# Patient Record
Sex: Male | Born: 1970 | Race: White | Hispanic: No | Marital: Single | Smoking: Current every day smoker
Health system: Southern US, Community
[De-identification: ages and names within clinical notes are randomized; demographics above are authoritative.]

## PROBLEM LIST (undated history)

## (undated) DIAGNOSIS — F329 Major depressive disorder, single episode, unspecified: Secondary | ICD-10-CM

## (undated) DIAGNOSIS — F32A Depression, unspecified: Secondary | ICD-10-CM

## (undated) DIAGNOSIS — Z8489 Family history of other specified conditions: Secondary | ICD-10-CM

## (undated) DIAGNOSIS — F419 Anxiety disorder, unspecified: Secondary | ICD-10-CM

## (undated) DIAGNOSIS — I10 Essential (primary) hypertension: Secondary | ICD-10-CM

## (undated) DIAGNOSIS — Z21 Asymptomatic human immunodeficiency virus [HIV] infection status: Secondary | ICD-10-CM

## (undated) DIAGNOSIS — B20 Human immunodeficiency virus [HIV] disease: Secondary | ICD-10-CM

## (undated) HISTORY — DX: Depression, unspecified: F32.A

## (undated) HISTORY — DX: Anxiety disorder, unspecified: F41.9

## (undated) HISTORY — DX: Asymptomatic human immunodeficiency virus (hiv) infection status: Z21

## (undated) HISTORY — DX: Major depressive disorder, single episode, unspecified: F32.9

## (undated) HISTORY — DX: Human immunodeficiency virus (HIV) disease: B20

---

## 1996-07-04 HISTORY — PX: APPENDECTOMY: SHX54

## 2000-08-04 ENCOUNTER — Encounter: Payer: Self-pay | Admitting: Infectious Diseases

## 2000-08-28 ENCOUNTER — Encounter (INDEPENDENT_AMBULATORY_CARE_PROVIDER_SITE_OTHER): Payer: Self-pay | Admitting: *Deleted

## 2000-08-28 ENCOUNTER — Encounter: Admission: RE | Admit: 2000-08-28 | Discharge: 2000-08-28 | Payer: Self-pay | Admitting: Infectious Diseases

## 2000-08-28 LAB — CONVERTED CEMR LAB
CD4 Count: 437 microliters
CD4 T Cell Abs: 437

## 2000-09-13 ENCOUNTER — Encounter: Admission: RE | Admit: 2000-09-13 | Discharge: 2000-09-13 | Payer: Self-pay | Admitting: Infectious Diseases

## 2000-10-04 ENCOUNTER — Ambulatory Visit (HOSPITAL_COMMUNITY): Admission: RE | Admit: 2000-10-04 | Discharge: 2000-10-04 | Payer: Self-pay | Admitting: Infectious Diseases

## 2000-10-04 ENCOUNTER — Encounter: Admission: RE | Admit: 2000-10-04 | Discharge: 2000-10-04 | Payer: Self-pay | Admitting: Infectious Diseases

## 2000-10-25 ENCOUNTER — Encounter: Admission: RE | Admit: 2000-10-25 | Discharge: 2000-10-25 | Payer: Self-pay | Admitting: Infectious Diseases

## 2000-11-21 ENCOUNTER — Encounter: Admission: RE | Admit: 2000-11-21 | Discharge: 2000-11-21 | Payer: Self-pay | Admitting: Infectious Diseases

## 2000-12-06 ENCOUNTER — Encounter: Admission: RE | Admit: 2000-12-06 | Discharge: 2000-12-06 | Payer: Self-pay | Admitting: Infectious Diseases

## 2000-12-28 ENCOUNTER — Encounter: Admission: RE | Admit: 2000-12-28 | Discharge: 2000-12-28 | Payer: Self-pay | Admitting: Infectious Diseases

## 2001-01-17 ENCOUNTER — Encounter: Admission: RE | Admit: 2001-01-17 | Discharge: 2001-01-17 | Payer: Self-pay | Admitting: Infectious Diseases

## 2001-05-11 ENCOUNTER — Ambulatory Visit (HOSPITAL_COMMUNITY): Admission: RE | Admit: 2001-05-11 | Discharge: 2001-05-11 | Payer: Self-pay | Admitting: Infectious Diseases

## 2001-05-11 ENCOUNTER — Encounter: Admission: RE | Admit: 2001-05-11 | Discharge: 2001-05-11 | Payer: Self-pay | Admitting: Infectious Diseases

## 2001-05-17 ENCOUNTER — Ambulatory Visit (HOSPITAL_COMMUNITY): Admission: RE | Admit: 2001-05-17 | Discharge: 2001-05-17 | Payer: Self-pay | Admitting: Infectious Diseases

## 2001-05-17 ENCOUNTER — Encounter: Admission: RE | Admit: 2001-05-17 | Discharge: 2001-05-17 | Payer: Self-pay | Admitting: Infectious Diseases

## 2001-05-23 ENCOUNTER — Encounter: Admission: RE | Admit: 2001-05-23 | Discharge: 2001-05-23 | Payer: Self-pay | Admitting: Infectious Diseases

## 2001-08-21 ENCOUNTER — Encounter: Admission: RE | Admit: 2001-08-21 | Discharge: 2001-08-21 | Payer: Self-pay | Admitting: Infectious Diseases

## 2001-08-21 ENCOUNTER — Ambulatory Visit (HOSPITAL_COMMUNITY): Admission: RE | Admit: 2001-08-21 | Discharge: 2001-08-21 | Payer: Self-pay | Admitting: Infectious Diseases

## 2001-09-05 ENCOUNTER — Encounter: Admission: RE | Admit: 2001-09-05 | Discharge: 2001-09-05 | Payer: Self-pay | Admitting: Infectious Diseases

## 2002-01-21 ENCOUNTER — Encounter: Admission: RE | Admit: 2002-01-21 | Discharge: 2002-01-21 | Payer: Self-pay | Admitting: Infectious Diseases

## 2002-01-21 ENCOUNTER — Ambulatory Visit (HOSPITAL_COMMUNITY): Admission: RE | Admit: 2002-01-21 | Discharge: 2002-01-21 | Payer: Self-pay | Admitting: Infectious Diseases

## 2002-02-13 ENCOUNTER — Encounter: Admission: RE | Admit: 2002-02-13 | Discharge: 2002-02-13 | Payer: Self-pay | Admitting: Infectious Diseases

## 2002-05-27 ENCOUNTER — Encounter: Admission: RE | Admit: 2002-05-27 | Discharge: 2002-05-27 | Payer: Self-pay | Admitting: Internal Medicine

## 2002-05-27 ENCOUNTER — Ambulatory Visit (HOSPITAL_COMMUNITY): Admission: RE | Admit: 2002-05-27 | Discharge: 2002-05-27 | Payer: Self-pay | Admitting: Infectious Diseases

## 2002-06-19 ENCOUNTER — Encounter: Admission: RE | Admit: 2002-06-19 | Discharge: 2002-06-19 | Payer: Self-pay | Admitting: Infectious Diseases

## 2002-08-22 ENCOUNTER — Encounter: Admission: RE | Admit: 2002-08-22 | Discharge: 2002-08-22 | Payer: Self-pay | Admitting: Infectious Diseases

## 2002-09-18 ENCOUNTER — Encounter: Admission: RE | Admit: 2002-09-18 | Discharge: 2002-09-18 | Payer: Self-pay | Admitting: Infectious Diseases

## 2002-09-27 ENCOUNTER — Encounter: Admission: RE | Admit: 2002-09-27 | Discharge: 2002-09-27 | Payer: Self-pay | Admitting: Infectious Diseases

## 2002-10-04 ENCOUNTER — Encounter: Admission: RE | Admit: 2002-10-04 | Discharge: 2002-10-04 | Payer: Self-pay | Admitting: Infectious Diseases

## 2002-10-16 ENCOUNTER — Encounter: Admission: RE | Admit: 2002-10-16 | Discharge: 2002-10-16 | Payer: Self-pay | Admitting: Infectious Diseases

## 2002-11-01 ENCOUNTER — Encounter: Admission: RE | Admit: 2002-11-01 | Discharge: 2002-11-01 | Payer: Self-pay | Admitting: Infectious Diseases

## 2002-11-06 ENCOUNTER — Encounter: Admission: RE | Admit: 2002-11-06 | Discharge: 2002-11-06 | Payer: Self-pay | Admitting: Infectious Diseases

## 2002-11-29 ENCOUNTER — Encounter: Admission: RE | Admit: 2002-11-29 | Discharge: 2002-11-29 | Payer: Self-pay | Admitting: Infectious Diseases

## 2002-12-23 ENCOUNTER — Encounter: Admission: RE | Admit: 2002-12-23 | Discharge: 2002-12-23 | Payer: Self-pay | Admitting: Infectious Diseases

## 2002-12-25 ENCOUNTER — Encounter: Admission: RE | Admit: 2002-12-25 | Discharge: 2002-12-25 | Payer: Self-pay | Admitting: Infectious Diseases

## 2003-01-16 ENCOUNTER — Encounter: Admission: RE | Admit: 2003-01-16 | Discharge: 2003-01-16 | Payer: Self-pay | Admitting: Infectious Diseases

## 2003-03-13 ENCOUNTER — Ambulatory Visit (HOSPITAL_BASED_OUTPATIENT_CLINIC_OR_DEPARTMENT_OTHER): Admission: RE | Admit: 2003-03-13 | Discharge: 2003-03-13 | Payer: Self-pay | Admitting: Surgery

## 2003-03-13 ENCOUNTER — Encounter (INDEPENDENT_AMBULATORY_CARE_PROVIDER_SITE_OTHER): Payer: Self-pay | Admitting: *Deleted

## 2003-03-19 ENCOUNTER — Encounter: Admission: RE | Admit: 2003-03-19 | Discharge: 2003-03-19 | Payer: Self-pay | Admitting: Infectious Diseases

## 2003-04-28 ENCOUNTER — Encounter: Admission: RE | Admit: 2003-04-28 | Discharge: 2003-04-28 | Payer: Self-pay | Admitting: Infectious Diseases

## 2003-05-01 ENCOUNTER — Ambulatory Visit (HOSPITAL_BASED_OUTPATIENT_CLINIC_OR_DEPARTMENT_OTHER): Admission: RE | Admit: 2003-05-01 | Discharge: 2003-05-01 | Payer: Self-pay | Admitting: Surgery

## 2003-05-16 ENCOUNTER — Encounter: Admission: RE | Admit: 2003-05-16 | Discharge: 2003-05-16 | Payer: Self-pay | Admitting: Infectious Diseases

## 2003-06-12 ENCOUNTER — Encounter: Admission: RE | Admit: 2003-06-12 | Discharge: 2003-06-12 | Payer: Self-pay | Admitting: Infectious Diseases

## 2003-07-05 HISTORY — PX: HAND SURGERY: SHX662

## 2003-07-11 ENCOUNTER — Encounter: Admission: RE | Admit: 2003-07-11 | Discharge: 2003-07-11 | Payer: Self-pay | Admitting: Infectious Diseases

## 2003-08-11 ENCOUNTER — Encounter: Admission: RE | Admit: 2003-08-11 | Discharge: 2003-08-11 | Payer: Self-pay | Admitting: Infectious Diseases

## 2003-09-05 ENCOUNTER — Encounter: Admission: RE | Admit: 2003-09-05 | Discharge: 2003-09-05 | Payer: Self-pay | Admitting: Infectious Diseases

## 2003-09-11 ENCOUNTER — Ambulatory Visit (HOSPITAL_COMMUNITY): Admission: RE | Admit: 2003-09-11 | Discharge: 2003-09-11 | Payer: Self-pay | Admitting: Surgery

## 2003-09-11 ENCOUNTER — Encounter (INDEPENDENT_AMBULATORY_CARE_PROVIDER_SITE_OTHER): Payer: Self-pay | Admitting: *Deleted

## 2003-09-11 ENCOUNTER — Ambulatory Visit (HOSPITAL_BASED_OUTPATIENT_CLINIC_OR_DEPARTMENT_OTHER): Admission: RE | Admit: 2003-09-11 | Discharge: 2003-09-11 | Payer: Self-pay | Admitting: Surgery

## 2003-11-19 ENCOUNTER — Encounter: Admission: RE | Admit: 2003-11-19 | Discharge: 2003-11-19 | Payer: Self-pay | Admitting: Infectious Diseases

## 2004-02-18 ENCOUNTER — Encounter: Admission: RE | Admit: 2004-02-18 | Discharge: 2004-02-18 | Payer: Self-pay | Admitting: Infectious Diseases

## 2004-02-18 ENCOUNTER — Ambulatory Visit (HOSPITAL_COMMUNITY): Admission: RE | Admit: 2004-02-18 | Discharge: 2004-02-18 | Payer: Self-pay | Admitting: Infectious Diseases

## 2004-03-10 ENCOUNTER — Ambulatory Visit: Payer: Self-pay | Admitting: Infectious Diseases

## 2004-05-07 ENCOUNTER — Ambulatory Visit (HOSPITAL_COMMUNITY): Admission: RE | Admit: 2004-05-07 | Discharge: 2004-05-07 | Payer: Self-pay | Admitting: Infectious Diseases

## 2004-05-07 ENCOUNTER — Ambulatory Visit: Payer: Self-pay | Admitting: Infectious Diseases

## 2004-05-24 ENCOUNTER — Ambulatory Visit: Payer: Self-pay | Admitting: Infectious Diseases

## 2004-08-18 ENCOUNTER — Ambulatory Visit: Payer: Self-pay | Admitting: Infectious Diseases

## 2004-08-18 ENCOUNTER — Ambulatory Visit (HOSPITAL_COMMUNITY): Admission: RE | Admit: 2004-08-18 | Discharge: 2004-08-18 | Payer: Self-pay | Admitting: Infectious Diseases

## 2004-09-01 ENCOUNTER — Ambulatory Visit: Payer: Self-pay | Admitting: Infectious Diseases

## 2004-09-23 ENCOUNTER — Ambulatory Visit: Payer: Self-pay | Admitting: Internal Medicine

## 2004-09-23 ENCOUNTER — Inpatient Hospital Stay (HOSPITAL_COMMUNITY): Admission: EM | Admit: 2004-09-23 | Discharge: 2004-09-24 | Payer: Self-pay | Admitting: Emergency Medicine

## 2004-09-23 ENCOUNTER — Ambulatory Visit: Payer: Self-pay | Admitting: Infectious Diseases

## 2004-10-13 ENCOUNTER — Ambulatory Visit: Payer: Self-pay | Admitting: Infectious Diseases

## 2004-10-15 DIAGNOSIS — T1490XA Injury, unspecified, initial encounter: Secondary | ICD-10-CM

## 2004-11-12 ENCOUNTER — Ambulatory Visit: Payer: Self-pay | Admitting: Infectious Diseases

## 2004-11-12 ENCOUNTER — Ambulatory Visit (HOSPITAL_COMMUNITY): Admission: RE | Admit: 2004-11-12 | Discharge: 2004-11-12 | Payer: Self-pay | Admitting: Infectious Diseases

## 2005-01-17 ENCOUNTER — Ambulatory Visit: Payer: Self-pay | Admitting: Infectious Diseases

## 2005-01-27 ENCOUNTER — Ambulatory Visit: Payer: Self-pay | Admitting: Infectious Diseases

## 2005-03-09 ENCOUNTER — Ambulatory Visit (HOSPITAL_COMMUNITY): Admission: RE | Admit: 2005-03-09 | Discharge: 2005-03-09 | Payer: Self-pay | Admitting: Infectious Diseases

## 2005-03-09 ENCOUNTER — Ambulatory Visit: Payer: Self-pay | Admitting: Infectious Diseases

## 2005-03-23 ENCOUNTER — Ambulatory Visit: Payer: Self-pay | Admitting: Infectious Diseases

## 2005-07-27 ENCOUNTER — Ambulatory Visit: Payer: Self-pay | Admitting: Infectious Diseases

## 2005-08-17 ENCOUNTER — Ambulatory Visit: Payer: Self-pay | Admitting: Infectious Diseases

## 2005-12-15 ENCOUNTER — Encounter: Admission: RE | Admit: 2005-12-15 | Discharge: 2005-12-15 | Payer: Self-pay | Admitting: Infectious Diseases

## 2005-12-15 ENCOUNTER — Ambulatory Visit: Payer: Self-pay | Admitting: Infectious Diseases

## 2005-12-15 ENCOUNTER — Encounter (INDEPENDENT_AMBULATORY_CARE_PROVIDER_SITE_OTHER): Payer: Self-pay | Admitting: *Deleted

## 2005-12-15 LAB — CONVERTED CEMR LAB
CD4 Count: 780 microliters
HIV 1 RNA Quant: 399 copies/mL

## 2006-01-02 ENCOUNTER — Ambulatory Visit: Payer: Self-pay | Admitting: Infectious Diseases

## 2006-03-17 ENCOUNTER — Encounter (INDEPENDENT_AMBULATORY_CARE_PROVIDER_SITE_OTHER): Payer: Self-pay | Admitting: *Deleted

## 2006-03-17 ENCOUNTER — Ambulatory Visit: Payer: Self-pay | Admitting: Internal Medicine

## 2006-03-17 ENCOUNTER — Encounter: Admission: RE | Admit: 2006-03-17 | Discharge: 2006-03-17 | Payer: Self-pay | Admitting: Internal Medicine

## 2006-03-17 LAB — CONVERTED CEMR LAB
CD4 Count: 700 microliters
HIV 1 RNA Quant: 267 copies/mL

## 2006-03-21 ENCOUNTER — Ambulatory Visit: Payer: Self-pay | Admitting: Internal Medicine

## 2006-05-03 ENCOUNTER — Ambulatory Visit: Payer: Self-pay | Admitting: Infectious Diseases

## 2006-08-07 ENCOUNTER — Encounter (INDEPENDENT_AMBULATORY_CARE_PROVIDER_SITE_OTHER): Payer: Self-pay | Admitting: *Deleted

## 2006-08-07 ENCOUNTER — Ambulatory Visit: Payer: Self-pay | Admitting: Infectious Diseases

## 2006-08-07 ENCOUNTER — Encounter: Admission: RE | Admit: 2006-08-07 | Discharge: 2006-08-07 | Payer: Self-pay | Admitting: Infectious Diseases

## 2006-08-07 LAB — CONVERTED CEMR LAB
ALT: 31 units/L (ref 0–53)
AST: 22 units/L (ref 0–37)
Albumin: 4.3 g/dL (ref 3.5–5.2)
Alkaline Phosphatase: 92 units/L (ref 39–117)
BUN: 13 mg/dL (ref 6–23)
Basophils Absolute: 0 10*3/uL (ref 0.0–0.1)
Basophils Relative: 0 % (ref 0–1)
Bilirubin Urine: NEGATIVE
CD4 Count: 520 microliters
CO2: 26 meq/L (ref 19–32)
Calcium: 9.6 mg/dL (ref 8.4–10.5)
Chloride: 105 meq/L (ref 96–112)
Creatinine, Ser: 0.78 mg/dL (ref 0.40–1.50)
Eosinophils Absolute: 0 10*3/uL (ref 0.0–0.7)
Eosinophils Relative: 0 % (ref 0–5)
Glucose, Bld: 106 mg/dL — ABNORMAL HIGH (ref 70–99)
HCT: 50.3 % (ref 39.0–52.0)
HIV 1 RNA Quant: 607 copies/mL
HIV 1 RNA Quant: 607 copies/mL — ABNORMAL HIGH (ref <50)
HIV-1 RNA Quant, Log: 2.78 — ABNORMAL HIGH (ref <1.70)
Hemoglobin, Urine: NEGATIVE
Hemoglobin: 16.3 g/dL (ref 13.0–17.0)
Ketones, ur: NEGATIVE mg/dL
Leukocytes, UA: NEGATIVE
Lymphocytes Relative: 18 % (ref 12–46)
Lymphs Abs: 1.7 10*3/uL (ref 0.7–3.3)
MCHC: 32.4 g/dL (ref 30.0–36.0)
MCV: 96.5 fL (ref 78.0–100.0)
Monocytes Absolute: 0.7 10*3/uL (ref 0.2–0.7)
Monocytes Relative: 7 % (ref 3–11)
Neutro Abs: 7.3 10*3/uL (ref 1.7–7.7)
Neutrophils Relative %: 75 % (ref 43–77)
Nitrite: NEGATIVE
Platelets: 279 10*3/uL (ref 150–400)
Potassium: 4.3 meq/L (ref 3.5–5.3)
Protein, ur: NEGATIVE mg/dL
RBC: 5.21 M/uL (ref 4.22–5.81)
RDW: 14.3 % — ABNORMAL HIGH (ref 11.5–14.0)
Sodium: 140 meq/L (ref 135–145)
Specific Gravity, Urine: 1.015 (ref 1.005–1.03)
Total Bilirubin: 0.4 mg/dL (ref 0.3–1.2)
Total Protein: 6.8 g/dL (ref 6.0–8.3)
Urine Glucose: NEGATIVE mg/dL
Urobilinogen, UA: 0.2 (ref 0.0–1.0)
WBC: 9.8 10*3/uL (ref 4.0–10.5)
pH: 7 (ref 5.0–8.0)

## 2006-08-09 DIAGNOSIS — F329 Major depressive disorder, single episode, unspecified: Secondary | ICD-10-CM

## 2006-08-09 DIAGNOSIS — A630 Anogenital (venereal) warts: Secondary | ICD-10-CM

## 2006-08-09 DIAGNOSIS — F32A Depression, unspecified: Secondary | ICD-10-CM | POA: Insufficient documentation

## 2006-08-09 DIAGNOSIS — F411 Generalized anxiety disorder: Secondary | ICD-10-CM

## 2006-08-09 DIAGNOSIS — B20 Human immunodeficiency virus [HIV] disease: Secondary | ICD-10-CM

## 2006-08-22 ENCOUNTER — Emergency Department (HOSPITAL_COMMUNITY): Admission: EM | Admit: 2006-08-22 | Discharge: 2006-08-22 | Payer: Self-pay | Admitting: Emergency Medicine

## 2006-08-28 ENCOUNTER — Encounter: Payer: Self-pay | Admitting: Infectious Diseases

## 2006-08-28 ENCOUNTER — Encounter (INDEPENDENT_AMBULATORY_CARE_PROVIDER_SITE_OTHER): Payer: Self-pay | Admitting: *Deleted

## 2006-08-28 LAB — CONVERTED CEMR LAB

## 2006-09-06 ENCOUNTER — Telehealth (INDEPENDENT_AMBULATORY_CARE_PROVIDER_SITE_OTHER): Payer: Self-pay | Admitting: Infectious Diseases

## 2006-09-10 ENCOUNTER — Encounter (INDEPENDENT_AMBULATORY_CARE_PROVIDER_SITE_OTHER): Payer: Self-pay | Admitting: *Deleted

## 2006-09-13 ENCOUNTER — Telehealth: Payer: Self-pay | Admitting: Infectious Diseases

## 2006-09-21 ENCOUNTER — Telehealth: Payer: Self-pay | Admitting: Infectious Diseases

## 2006-10-11 ENCOUNTER — Ambulatory Visit: Payer: Self-pay | Admitting: Internal Medicine

## 2006-10-11 ENCOUNTER — Telehealth: Payer: Self-pay | Admitting: Infectious Diseases

## 2006-10-11 DIAGNOSIS — A630 Anogenital (venereal) warts: Secondary | ICD-10-CM | POA: Insufficient documentation

## 2006-10-11 DIAGNOSIS — R197 Diarrhea, unspecified: Secondary | ICD-10-CM

## 2006-10-16 ENCOUNTER — Ambulatory Visit: Payer: Self-pay | Admitting: Infectious Diseases

## 2006-10-16 DIAGNOSIS — L02818 Cutaneous abscess of other sites: Secondary | ICD-10-CM

## 2006-10-16 DIAGNOSIS — L03818 Cellulitis of other sites: Secondary | ICD-10-CM

## 2006-10-18 ENCOUNTER — Telehealth: Payer: Self-pay

## 2006-11-22 ENCOUNTER — Telehealth: Payer: Self-pay | Admitting: Infectious Diseases

## 2007-01-22 ENCOUNTER — Telehealth: Payer: Self-pay | Admitting: Infectious Diseases

## 2007-01-22 ENCOUNTER — Ambulatory Visit: Payer: Self-pay | Admitting: Infectious Diseases

## 2007-01-22 ENCOUNTER — Encounter: Admission: RE | Admit: 2007-01-22 | Discharge: 2007-01-22 | Payer: Self-pay | Admitting: Infectious Diseases

## 2007-01-22 LAB — CONVERTED CEMR LAB
ALT: 38 units/L (ref 0–53)
AST: 25 units/L (ref 0–37)
Albumin: 4 g/dL (ref 3.5–5.2)
Alkaline Phosphatase: 90 units/L (ref 39–117)
BUN: 12 mg/dL (ref 6–23)
Basophils Absolute: 0 10*3/uL (ref 0.0–0.1)
Basophils Relative: 0 % (ref 0–1)
CO2: 27 meq/L (ref 19–32)
Calcium: 9 mg/dL (ref 8.4–10.5)
Chloride: 106 meq/L (ref 96–112)
Creatinine, Ser: 0.75 mg/dL (ref 0.40–1.50)
Eosinophils Absolute: 0.2 10*3/uL (ref 0.0–0.7)
Eosinophils Relative: 2 % (ref 0–5)
Glucose, Bld: 111 mg/dL — ABNORMAL HIGH (ref 70–99)
HCT: 49.3 % (ref 39.0–52.0)
HIV 1 RNA Quant: 210 copies/mL — ABNORMAL HIGH (ref <50)
HIV-1 RNA Quant, Log: 2.32 — ABNORMAL HIGH (ref <1.70)
Hemoglobin: 16.3 g/dL (ref 13.0–17.0)
Lymphocytes Relative: 29 % (ref 12–46)
Lymphs Abs: 2.3 10*3/uL (ref 0.7–3.3)
MCHC: 33.1 g/dL (ref 30.0–36.0)
MCV: 98.6 fL (ref 78.0–100.0)
Monocytes Absolute: 0.7 10*3/uL (ref 0.2–0.7)
Monocytes Relative: 10 % (ref 3–11)
Neutro Abs: 4.6 10*3/uL (ref 1.7–7.7)
Neutrophils Relative %: 59 % (ref 43–77)
Platelets: 263 10*3/uL (ref 150–400)
Potassium: 4.6 meq/L (ref 3.5–5.3)
RBC: 5 M/uL (ref 4.22–5.81)
RDW: 13.9 % (ref 11.5–14.0)
Sodium: 141 meq/L (ref 135–145)
Total Bilirubin: 0.3 mg/dL (ref 0.3–1.2)
Total Protein: 6.4 g/dL (ref 6.0–8.3)
WBC: 7.8 10*3/uL (ref 4.0–10.5)

## 2007-02-05 ENCOUNTER — Ambulatory Visit: Payer: Self-pay | Admitting: Infectious Diseases

## 2007-02-05 ENCOUNTER — Encounter (INDEPENDENT_AMBULATORY_CARE_PROVIDER_SITE_OTHER): Payer: Self-pay | Admitting: *Deleted

## 2007-02-05 DIAGNOSIS — F17200 Nicotine dependence, unspecified, uncomplicated: Secondary | ICD-10-CM

## 2007-02-21 ENCOUNTER — Telehealth: Payer: Self-pay | Admitting: Infectious Diseases

## 2007-03-14 ENCOUNTER — Telehealth: Payer: Self-pay | Admitting: Infectious Diseases

## 2007-03-20 ENCOUNTER — Encounter: Payer: Self-pay | Admitting: Infectious Diseases

## 2007-03-21 ENCOUNTER — Encounter: Payer: Self-pay | Admitting: Infectious Diseases

## 2007-04-09 ENCOUNTER — Telehealth: Payer: Self-pay | Admitting: Infectious Diseases

## 2007-05-17 ENCOUNTER — Telehealth: Payer: Self-pay | Admitting: Infectious Diseases

## 2007-06-11 ENCOUNTER — Telehealth: Payer: Self-pay | Admitting: Infectious Diseases

## 2007-06-18 ENCOUNTER — Encounter: Admission: RE | Admit: 2007-06-18 | Discharge: 2007-06-18 | Payer: Self-pay | Admitting: Infectious Diseases

## 2007-06-18 ENCOUNTER — Ambulatory Visit: Payer: Self-pay | Admitting: Infectious Diseases

## 2007-06-18 LAB — CONVERTED CEMR LAB
ALT: 35 units/L (ref 0–53)
AST: 22 units/L (ref 0–37)
Albumin: 4.6 g/dL (ref 3.5–5.2)
Alkaline Phosphatase: 85 units/L (ref 39–117)
BUN: 10 mg/dL (ref 6–23)
Basophils Absolute: 0 10*3/uL (ref 0.0–0.1)
Basophils Relative: 0 % (ref 0–1)
CO2: 26 meq/L (ref 19–32)
Calcium: 9.6 mg/dL (ref 8.4–10.5)
Chloride: 103 meq/L (ref 96–112)
Creatinine, Ser: 0.83 mg/dL (ref 0.40–1.50)
Eosinophils Absolute: 0.1 10*3/uL — ABNORMAL LOW (ref 0.2–0.7)
Eosinophils Relative: 1 % (ref 0–5)
Glucose, Bld: 88 mg/dL (ref 70–99)
HCT: 50.9 % (ref 39.0–52.0)
HIV 1 RNA Quant: 138 copies/mL — ABNORMAL HIGH (ref <50)
HIV-1 RNA Quant, Log: 2.14 — ABNORMAL HIGH (ref <1.70)
Hemoglobin: 16.8 g/dL (ref 13.0–17.0)
Lymphocytes Relative: 27 % (ref 12–46)
Lymphs Abs: 2.3 10*3/uL (ref 0.7–4.0)
MCHC: 33 g/dL (ref 30.0–36.0)
MCV: 99 fL (ref 78.0–100.0)
Monocytes Absolute: 0.8 10*3/uL (ref 0.1–1.0)
Monocytes Relative: 9 % (ref 3–12)
Neutro Abs: 5.3 10*3/uL (ref 1.7–7.7)
Neutrophils Relative %: 63 % (ref 43–77)
Platelets: 255 10*3/uL (ref 150–400)
Potassium: 4.7 meq/L (ref 3.5–5.3)
RBC: 5.14 M/uL (ref 4.22–5.81)
RDW: 13.5 % (ref 11.5–15.5)
Sodium: 140 meq/L (ref 135–145)
Total Bilirubin: 0.4 mg/dL (ref 0.3–1.2)
Total Protein: 7 g/dL (ref 6.0–8.3)
WBC: 8.4 10*3/uL (ref 4.0–10.5)

## 2007-07-09 ENCOUNTER — Ambulatory Visit: Payer: Self-pay | Admitting: Infectious Diseases

## 2007-07-26 ENCOUNTER — Telehealth: Payer: Self-pay | Admitting: Infectious Diseases

## 2007-08-01 ENCOUNTER — Telehealth: Payer: Self-pay | Admitting: Infectious Diseases

## 2007-08-09 ENCOUNTER — Telehealth (INDEPENDENT_AMBULATORY_CARE_PROVIDER_SITE_OTHER): Payer: Self-pay | Admitting: *Deleted

## 2007-09-17 ENCOUNTER — Telehealth: Payer: Self-pay | Admitting: Infectious Diseases

## 2007-09-19 ENCOUNTER — Encounter: Admission: RE | Admit: 2007-09-19 | Discharge: 2007-09-19 | Payer: Self-pay | Admitting: Infectious Diseases

## 2007-09-19 ENCOUNTER — Ambulatory Visit: Payer: Self-pay | Admitting: Internal Medicine

## 2007-09-19 ENCOUNTER — Encounter: Payer: Self-pay | Admitting: Infectious Diseases

## 2007-09-19 DIAGNOSIS — J209 Acute bronchitis, unspecified: Secondary | ICD-10-CM

## 2007-09-19 LAB — CONVERTED CEMR LAB
ALT: 29 units/L (ref 0–53)
AST: 19 units/L (ref 0–37)
Albumin: 4.1 g/dL (ref 3.5–5.2)
Alkaline Phosphatase: 88 units/L (ref 39–117)
BUN: 9 mg/dL (ref 6–23)
Basophils Absolute: 0 10*3/uL (ref 0.0–0.1)
Basophils Relative: 0 % (ref 0–1)
CO2: 22 meq/L (ref 19–32)
Calcium: 8.7 mg/dL (ref 8.4–10.5)
Chloride: 105 meq/L (ref 96–112)
Cholesterol: 142 mg/dL (ref 0–200)
Creatinine, Ser: 0.65 mg/dL (ref 0.40–1.50)
Eosinophils Absolute: 0.1 10*3/uL (ref 0.0–0.7)
Eosinophils Relative: 1 % (ref 0–5)
Glucose, Bld: 99 mg/dL (ref 70–99)
HCT: 45.3 % (ref 39.0–52.0)
HDL: 33 mg/dL — ABNORMAL LOW (ref >39)
HIV 1 RNA Quant: <50 copies/mL (ref <50)
HIV-1 RNA Quant, Log: <1.7 (ref <1.70)
Hemoglobin: 15.2 g/dL (ref 13.0–17.0)
LDL Cholesterol: 82 mg/dL (ref 0–99)
Lymphocytes Relative: 27 % (ref 12–46)
Lymphs Abs: 2.4 10*3/uL (ref 0.7–4.0)
MCHC: 33.6 g/dL (ref 30.0–36.0)
MCV: 96.2 fL (ref 78.0–100.0)
Monocytes Absolute: 0.9 10*3/uL (ref 0.1–1.0)
Monocytes Relative: 11 % (ref 3–12)
Neutro Abs: 5.5 10*3/uL (ref 1.7–7.7)
Neutrophils Relative %: 62 % (ref 43–77)
Platelets: 210 10*3/uL (ref 150–400)
Potassium: 3.8 meq/L (ref 3.5–5.3)
RBC: 4.71 M/uL (ref 4.22–5.81)
RDW: 14.1 % (ref 11.5–15.5)
Sodium: 142 meq/L (ref 135–145)
Total Bilirubin: 0.5 mg/dL (ref 0.3–1.2)
Total CHOL/HDL Ratio: 4.3
Total Protein: 6.5 g/dL (ref 6.0–8.3)
Triglycerides: 133 mg/dL (ref <150)
VLDL: 27 mg/dL (ref 0–40)
WBC: 8.9 10*3/uL (ref 4.0–10.5)

## 2007-10-03 ENCOUNTER — Telehealth: Payer: Self-pay | Admitting: Infectious Diseases

## 2007-10-11 ENCOUNTER — Telehealth: Payer: Self-pay | Admitting: Infectious Diseases

## 2007-10-11 ENCOUNTER — Encounter: Payer: Self-pay | Admitting: Licensed Clinical Social Worker

## 2007-10-15 ENCOUNTER — Ambulatory Visit: Payer: Self-pay | Admitting: Infectious Diseases

## 2007-12-05 ENCOUNTER — Encounter: Admission: RE | Admit: 2007-12-05 | Discharge: 2007-12-05 | Payer: Self-pay | Admitting: Internal Medicine

## 2007-12-05 ENCOUNTER — Encounter: Payer: Self-pay | Admitting: Infectious Diseases

## 2007-12-05 ENCOUNTER — Ambulatory Visit: Payer: Self-pay | Admitting: Internal Medicine

## 2007-12-05 LAB — CONVERTED CEMR LAB
ALT: 34 units/L (ref 0–53)
AST: 24 units/L (ref 0–37)
Albumin: 4.6 g/dL (ref 3.5–5.2)
Alkaline Phosphatase: 74 units/L (ref 39–117)
BUN: 14 mg/dL (ref 6–23)
Basophils Absolute: 0 10*3/uL (ref 0.0–0.1)
Basophils Relative: 0 % (ref 0–1)
CO2: 25 meq/L (ref 19–32)
Calcium: 9.3 mg/dL (ref 8.4–10.5)
Chloride: 106 meq/L (ref 96–112)
Creatinine, Ser: 0.78 mg/dL (ref 0.40–1.50)
Eosinophils Absolute: 0.1 10*3/uL (ref 0.0–0.7)
Eosinophils Relative: 1 % (ref 0–5)
Glucose, Bld: 101 mg/dL — ABNORMAL HIGH (ref 70–99)
HCT: 50.3 % (ref 39.0–52.0)
HIV 1 RNA Quant: <50 copies/mL (ref <50)
HIV-1 RNA Quant, Log: <1.7 (ref <1.70)
Hemoglobin: 16.7 g/dL (ref 13.0–17.0)
Lymphocytes Relative: 29 % (ref 12–46)
Lymphs Abs: 2.7 10*3/uL (ref 0.7–4.0)
MCHC: 33.2 g/dL (ref 30.0–36.0)
MCV: 96.2 fL (ref 78.0–100.0)
Monocytes Absolute: 0.7 10*3/uL (ref 0.1–1.0)
Monocytes Relative: 8 % (ref 3–12)
Neutro Abs: 5.7 10*3/uL (ref 1.7–7.7)
Neutrophils Relative %: 62 % (ref 43–77)
Platelets: 250 10*3/uL (ref 150–400)
Potassium: 4.8 meq/L (ref 3.5–5.3)
RBC: 5.23 M/uL (ref 4.22–5.81)
RDW: 14.3 % (ref 11.5–15.5)
Sodium: 141 meq/L (ref 135–145)
Total Bilirubin: 0.4 mg/dL (ref 0.3–1.2)
Total Protein: 7.1 g/dL (ref 6.0–8.3)
WBC: 9.3 10*3/uL (ref 4.0–10.5)

## 2008-01-02 ENCOUNTER — Telehealth: Payer: Self-pay | Admitting: Infectious Diseases

## 2008-01-07 ENCOUNTER — Telehealth: Payer: Self-pay | Admitting: Infectious Diseases

## 2008-01-16 ENCOUNTER — Ambulatory Visit: Payer: Self-pay | Admitting: Infectious Diseases

## 2008-02-15 ENCOUNTER — Encounter: Payer: Self-pay | Admitting: Infectious Diseases

## 2008-02-22 ENCOUNTER — Telehealth: Payer: Self-pay | Admitting: Infectious Diseases

## 2008-05-06 ENCOUNTER — Telehealth: Payer: Self-pay | Admitting: Infectious Diseases

## 2008-05-28 ENCOUNTER — Ambulatory Visit: Payer: Self-pay | Admitting: Infectious Diseases

## 2008-05-28 LAB — CONVERTED CEMR LAB
ALT: 23 units/L (ref 0–53)
AST: 21 units/L (ref 0–37)
Albumin: 4.4 g/dL (ref 3.5–5.2)
Alkaline Phosphatase: 92 units/L (ref 39–117)
BUN: 13 mg/dL (ref 6–23)
Basophils Absolute: 0 10*3/uL (ref 0.0–0.1)
Basophils Relative: 0 % (ref 0–1)
CO2: 26 meq/L (ref 19–32)
Calcium: 9.3 mg/dL (ref 8.4–10.5)
Chloride: 102 meq/L (ref 96–112)
Creatinine, Ser: 0.77 mg/dL (ref 0.40–1.50)
Eosinophils Absolute: 0.1 10*3/uL (ref 0.0–0.7)
Eosinophils Relative: 1 % (ref 0–5)
Glucose, Bld: 94 mg/dL (ref 70–99)
HCT: 47.6 % (ref 39.0–52.0)
HIV 1 RNA Quant: 143 copies/mL — ABNORMAL HIGH (ref <48)
HIV-1 RNA Quant, Log: 2.16 — ABNORMAL HIGH (ref <1.68)
Hemoglobin: 15.9 g/dL (ref 13.0–17.0)
Lymphocytes Relative: 25 % (ref 12–46)
Lymphs Abs: 2.9 10*3/uL (ref 0.7–4.0)
MCHC: 33.4 g/dL (ref 30.0–36.0)
MCV: 95.6 fL (ref 78.0–100.0)
Monocytes Absolute: 0.6 10*3/uL (ref 0.1–1.0)
Monocytes Relative: 5 % (ref 3–12)
Neutro Abs: 7.9 10*3/uL — ABNORMAL HIGH (ref 1.7–7.7)
Neutrophils Relative %: 69 % (ref 43–77)
Platelets: 258 10*3/uL (ref 150–400)
Potassium: 4.9 meq/L (ref 3.5–5.3)
RBC: 4.98 M/uL (ref 4.22–5.81)
RDW: 13.2 % (ref 11.5–15.5)
Sodium: 138 meq/L (ref 135–145)
Total Bilirubin: 0.9 mg/dL (ref 0.3–1.2)
Total Protein: 6.6 g/dL (ref 6.0–8.3)
WBC: 11.5 10*3/uL — ABNORMAL HIGH (ref 4.0–10.5)

## 2008-06-18 ENCOUNTER — Ambulatory Visit: Payer: Self-pay | Admitting: Infectious Diseases

## 2008-10-15 ENCOUNTER — Telehealth: Payer: Self-pay | Admitting: Infectious Diseases

## 2008-11-05 ENCOUNTER — Ambulatory Visit: Payer: Self-pay | Admitting: Infectious Diseases

## 2008-11-05 LAB — CONVERTED CEMR LAB
ALT: 23 units/L (ref 0–53)
AST: 24 units/L (ref 0–37)
Albumin: 4.1 g/dL (ref 3.5–5.2)
Alkaline Phosphatase: 66 units/L (ref 39–117)
BUN: 13 mg/dL (ref 6–23)
Basophils Absolute: 0 10*3/uL (ref 0.0–0.1)
Basophils Relative: 1 % (ref 0–1)
CO2: 23 meq/L (ref 19–32)
Calcium: 8.9 mg/dL (ref 8.4–10.5)
Chloride: 105 meq/L (ref 96–112)
Cholesterol: 179 mg/dL (ref 0–200)
Creatinine, Ser: 0.84 mg/dL (ref 0.40–1.50)
Eosinophils Absolute: 0.1 10*3/uL (ref 0.0–0.7)
Eosinophils Relative: 1 % (ref 0–5)
GFR calc Af Amer: >60 mL/min (ref >60)
GFR calc non Af Amer: >60 mL/min (ref >60)
Glucose, Bld: 98 mg/dL (ref 70–99)
HCT: 47.9 % (ref 39.0–52.0)
HDL: 52 mg/dL (ref >39)
HIV 1 RNA Quant: 209 copies/mL — ABNORMAL HIGH (ref <48)
HIV-1 RNA Quant, Log: 2.32 — ABNORMAL HIGH (ref <1.68)
Hemoglobin: 16.1 g/dL (ref 13.0–17.0)
LDL Cholesterol: 117 mg/dL — ABNORMAL HIGH (ref 0–99)
Lymphocytes Relative: 26 % (ref 12–46)
Lymphs Abs: 1.7 10*3/uL (ref 0.7–4.0)
MCHC: 33.6 g/dL (ref 30.0–36.0)
MCV: 100.8 fL — ABNORMAL HIGH (ref 78.0–100.0)
Monocytes Absolute: 0.5 10*3/uL (ref 0.1–1.0)
Monocytes Relative: 8 % (ref 3–12)
Neutro Abs: 4.2 10*3/uL (ref 1.7–7.7)
Neutrophils Relative %: 64 % (ref 43–77)
Platelets: 210 10*3/uL (ref 150–400)
Potassium: 4.7 meq/L (ref 3.5–5.3)
RBC: 4.75 M/uL (ref 4.22–5.81)
RDW: 12.9 % (ref 11.5–15.5)
Sodium: 137 meq/L (ref 135–145)
Total Bilirubin: 0.4 mg/dL (ref 0.3–1.2)
Total CHOL/HDL Ratio: 3.4
Total Protein: 6.6 g/dL (ref 6.0–8.3)
Triglycerides: 48 mg/dL (ref <150)
VLDL: 10 mg/dL (ref 0–40)
WBC: 6.5 10*3/uL (ref 4.0–10.5)

## 2008-11-19 ENCOUNTER — Ambulatory Visit: Payer: Self-pay | Admitting: Infectious Diseases

## 2008-11-19 DIAGNOSIS — R64 Cachexia: Secondary | ICD-10-CM

## 2008-11-25 ENCOUNTER — Telehealth (INDEPENDENT_AMBULATORY_CARE_PROVIDER_SITE_OTHER): Payer: Self-pay | Admitting: *Deleted

## 2008-12-31 ENCOUNTER — Encounter (INDEPENDENT_AMBULATORY_CARE_PROVIDER_SITE_OTHER): Payer: Self-pay | Admitting: *Deleted

## 2008-12-31 ENCOUNTER — Encounter: Payer: Self-pay | Admitting: Infectious Diseases

## 2009-01-01 ENCOUNTER — Encounter: Payer: Self-pay | Admitting: Infectious Diseases

## 2009-02-19 ENCOUNTER — Telehealth (INDEPENDENT_AMBULATORY_CARE_PROVIDER_SITE_OTHER): Payer: Self-pay | Admitting: *Deleted

## 2009-02-24 ENCOUNTER — Ambulatory Visit: Payer: Self-pay | Admitting: Infectious Diseases

## 2009-02-24 LAB — CONVERTED CEMR LAB
ALT: 31 units/L (ref 0–53)
AST: 19 units/L (ref 0–37)
Albumin: 3.9 g/dL (ref 3.5–5.2)
Alkaline Phosphatase: 71 units/L (ref 39–117)
BUN: 11 mg/dL (ref 6–23)
Basophils Absolute: 0 10*3/uL (ref 0.0–0.1)
Basophils Relative: 0 % (ref 0–1)
CO2: 25 meq/L (ref 19–32)
Calcium: 9.1 mg/dL (ref 8.4–10.5)
Chloride: 102 meq/L (ref 96–112)
Creatinine, Ser: 0.88 mg/dL (ref 0.40–1.50)
Eosinophils Absolute: 0.1 10*3/uL (ref 0.0–0.7)
Eosinophils Relative: 1 % (ref 0–5)
Glucose, Bld: 87 mg/dL (ref 70–99)
HCT: 49.4 % (ref 39.0–52.0)
HIV 1 RNA Quant: 462 copies/mL — ABNORMAL HIGH (ref <48)
HIV-1 RNA Quant, Log: 2.66 — ABNORMAL HIGH (ref <1.68)
Hemoglobin: 16.7 g/dL (ref 13.0–17.0)
Lymphocytes Relative: 32 % (ref 12–46)
Lymphs Abs: 2.6 10*3/uL (ref 0.7–4.0)
MCHC: 33.8 g/dL (ref 30.0–36.0)
MCV: 97.2 fL (ref >=78.0)
Monocytes Absolute: 0.7 10*3/uL (ref 0.1–1.0)
Monocytes Relative: 9 % (ref 3–12)
Neutro Abs: 4.8 10*3/uL (ref 1.7–7.7)
Neutrophils Relative %: 58 % (ref 43–77)
Platelets: 211 10*3/uL (ref 150–400)
Potassium: 4.5 meq/L (ref 3.5–5.3)
RBC: 5.08 M/uL (ref 4.22–5.81)
RDW: 12.8 % (ref 11.5–15.5)
Sodium: 138 meq/L (ref 135–145)
Total Bilirubin: 0.3 mg/dL (ref 0.3–1.2)
Total Protein: 6.6 g/dL (ref 6.0–8.3)
WBC: 8.2 10*3/uL (ref 4.0–10.5)

## 2009-03-06 ENCOUNTER — Encounter (INDEPENDENT_AMBULATORY_CARE_PROVIDER_SITE_OTHER): Payer: Self-pay | Admitting: *Deleted

## 2009-03-11 ENCOUNTER — Ambulatory Visit: Payer: Self-pay | Admitting: Infectious Diseases

## 2009-04-22 ENCOUNTER — Telehealth: Payer: Self-pay | Admitting: Infectious Diseases

## 2009-04-22 ENCOUNTER — Ambulatory Visit: Payer: Self-pay | Admitting: Infectious Diseases

## 2009-04-23 ENCOUNTER — Telehealth: Payer: Self-pay | Admitting: Infectious Diseases

## 2009-05-20 ENCOUNTER — Ambulatory Visit: Payer: Self-pay | Admitting: Internal Medicine

## 2009-05-20 ENCOUNTER — Telehealth: Payer: Self-pay

## 2009-05-22 ENCOUNTER — Telehealth: Payer: Self-pay | Admitting: Infectious Diseases

## 2009-05-25 ENCOUNTER — Ambulatory Visit: Payer: Self-pay | Admitting: Infectious Diseases

## 2009-05-26 ENCOUNTER — Telehealth: Payer: Self-pay | Admitting: Infectious Diseases

## 2009-05-26 ENCOUNTER — Ambulatory Visit: Payer: Self-pay | Admitting: Infectious Diseases

## 2009-05-26 LAB — CONVERTED CEMR LAB
ALT: 22 units/L (ref 0–53)
AST: 25 units/L (ref 0–37)
Albumin: 4.1 g/dL (ref 3.5–5.2)
Alkaline Phosphatase: 82 units/L (ref 39–117)
BUN: 17 mg/dL (ref 6–23)
Basophils Absolute: 0 10*3/uL (ref 0.0–0.1)
Basophils Relative: 1 % (ref 0–1)
CO2: 23 meq/L (ref 19–32)
Calcium: 9.1 mg/dL (ref 8.4–10.5)
Chloride: 106 meq/L (ref 96–112)
Cholesterol: 173 mg/dL (ref 0–200)
Creatinine, Ser: 0.76 mg/dL (ref 0.40–1.50)
Eosinophils Absolute: 0.1 10*3/uL (ref 0.0–0.7)
Eosinophils Relative: 1 % (ref 0–5)
Glucose, Bld: 103 mg/dL — ABNORMAL HIGH (ref 70–99)
HCT: 48.3 % (ref 39.0–52.0)
HDL: 50 mg/dL (ref >39)
HIV 1 RNA Quant: 197 copies/mL — ABNORMAL HIGH (ref <48)
HIV-1 RNA Quant, Log: 2.29 — ABNORMAL HIGH (ref <1.68)
Hemoglobin: 16.3 g/dL (ref 13.0–17.0)
LDL Cholesterol: 109 mg/dL — ABNORMAL HIGH (ref 0–99)
Lymphocytes Relative: 37 % (ref 12–46)
Lymphs Abs: 2.8 10*3/uL (ref 0.7–4.0)
MCHC: 33.7 g/dL (ref 30.0–36.0)
MCV: 99.4 fL (ref >=78.0)
Monocytes Absolute: 0.6 10*3/uL (ref 0.1–1.0)
Monocytes Relative: 8 % (ref 3–12)
Neutro Abs: 4.2 10*3/uL (ref 1.7–7.7)
Neutrophils Relative %: 54 % (ref 43–77)
Platelets: 261 10*3/uL (ref 150–400)
Potassium: 5.2 meq/L (ref 3.5–5.3)
RBC: 4.86 M/uL (ref 4.22–5.81)
RDW: 13.5 % (ref 11.5–15.5)
Sodium: 139 meq/L (ref 135–145)
Total Bilirubin: 0.2 mg/dL — ABNORMAL LOW (ref 0.3–1.2)
Total CHOL/HDL Ratio: 3.5
Total Protein: 6.2 g/dL (ref 6.0–8.3)
Triglycerides: 70 mg/dL (ref <150)
VLDL: 14 mg/dL (ref 0–40)
WBC: 7.7 10*3/uL (ref 4.0–10.5)

## 2009-06-16 ENCOUNTER — Emergency Department (HOSPITAL_COMMUNITY): Admission: EM | Admit: 2009-06-16 | Discharge: 2009-06-17 | Payer: Self-pay | Admitting: Emergency Medicine

## 2009-06-16 ENCOUNTER — Telehealth: Payer: Self-pay | Admitting: Infectious Diseases

## 2009-06-17 ENCOUNTER — Inpatient Hospital Stay (HOSPITAL_COMMUNITY): Admission: EM | Admit: 2009-06-17 | Discharge: 2009-06-18 | Payer: Self-pay | Admitting: Psychiatry

## 2009-06-17 ENCOUNTER — Ambulatory Visit: Payer: Self-pay | Admitting: Psychiatry

## 2009-06-18 ENCOUNTER — Telehealth: Payer: Self-pay | Admitting: Infectious Diseases

## 2009-06-23 ENCOUNTER — Telehealth: Payer: Self-pay | Admitting: Infectious Diseases

## 2009-06-25 ENCOUNTER — Ambulatory Visit: Payer: Self-pay | Admitting: Infectious Diseases

## 2009-08-12 ENCOUNTER — Encounter: Payer: Self-pay | Admitting: Infectious Diseases

## 2009-09-03 ENCOUNTER — Ambulatory Visit: Payer: Self-pay | Admitting: Infectious Diseases

## 2009-09-03 LAB — CONVERTED CEMR LAB
ALT: 37 units/L (ref 0–53)
AST: 24 units/L (ref 0–37)
Albumin: 4.5 g/dL (ref 3.5–5.2)
Alkaline Phosphatase: 85 units/L (ref 39–117)
BUN: 13 mg/dL (ref 6–23)
Basophils Absolute: 0 10*3/uL (ref 0.0–0.1)
CO2: 22 meq/L (ref 19–32)
Calcium: 9.4 mg/dL (ref 8.4–10.5)
Chlamydia, Swab/Urine, PCR: NEGATIVE
Chloride: 101 meq/L (ref 96–112)
Creatinine, Ser: 0.72 mg/dL (ref 0.40–1.50)
Eosinophils Absolute: 0.1 10*3/uL (ref 0.0–0.7)
Eosinophils Relative: 1 % (ref 0–5)
HCT: 48.9 % (ref 39.0–52.0)
HIV-1 RNA Quant, Log: 2.27 — ABNORMAL HIGH (ref <1.68)
Hemoglobin: 15.8 g/dL (ref 13.0–17.0)
Lymphocytes Relative: 26 % (ref 12–46)
Lymphs Abs: 2.1 10*3/uL (ref 0.7–4.0)
MCHC: 32.3 g/dL (ref 30.0–36.0)
MCV: 101.2 fL — ABNORMAL HIGH (ref >=78.0)
Monocytes Absolute: 0.7 10*3/uL (ref 0.1–1.0)
Monocytes Relative: 8 % (ref 3–12)
Neutro Abs: 5.3 10*3/uL (ref 1.7–7.7)
Neutrophils Relative %: 65 % (ref 43–77)
Potassium: 4.3 meq/L (ref 3.5–5.3)
RBC: 4.83 M/uL (ref 4.22–5.81)
RDW: 13.7 % (ref 11.5–15.5)
Sodium: 137 meq/L (ref 135–145)
Total Protein: 6.9 g/dL (ref 6.0–8.3)
WBC: 8.2 10*3/uL (ref 4.0–10.5)

## 2009-09-21 ENCOUNTER — Ambulatory Visit: Payer: Self-pay | Admitting: Infectious Diseases

## 2009-10-13 ENCOUNTER — Encounter (INDEPENDENT_AMBULATORY_CARE_PROVIDER_SITE_OTHER): Payer: Self-pay | Admitting: *Deleted

## 2010-01-27 ENCOUNTER — Telehealth (INDEPENDENT_AMBULATORY_CARE_PROVIDER_SITE_OTHER): Payer: Self-pay | Admitting: *Deleted

## 2010-01-28 ENCOUNTER — Ambulatory Visit: Payer: Self-pay | Admitting: Infectious Diseases

## 2010-01-28 LAB — CONVERTED CEMR LAB
ALT: 37 units/L (ref 0–53)
AST: 25 units/L (ref 0–37)
Albumin: 4 g/dL (ref 3.5–5.2)
Alkaline Phosphatase: 77 units/L (ref 39–117)
BUN: 11 mg/dL (ref 6–23)
Basophils Absolute: 0 10*3/uL (ref 0.0–0.1)
Basophils Relative: 0 % (ref 0–1)
CO2: 26 meq/L (ref 19–32)
Calcium: 8.8 mg/dL (ref 8.4–10.5)
Creatinine, Ser: 0.8 mg/dL (ref 0.40–1.50)
Eosinophils Absolute: 0.1 10*3/uL (ref 0.0–0.7)
Eosinophils Relative: 1 % (ref 0–5)
HCT: 46.5 % (ref 39.0–52.0)
Hemoglobin: 15.8 g/dL (ref 13.0–17.0)
Lymphocytes Relative: 34 % (ref 12–46)
MCHC: 34 g/dL (ref 30.0–36.0)
MCV: 95.7 fL (ref 78.0–100.0)
Monocytes Absolute: 0.6 10*3/uL (ref 0.1–1.0)
Monocytes Relative: 6 % (ref 3–12)
Neutro Abs: 5.4 10*3/uL (ref 1.7–7.7)
Platelets: 231 10*3/uL (ref 150–400)
RBC: 4.86 M/uL (ref 4.22–5.81)
RDW: 13.8 % (ref 11.5–15.5)
Sodium: 140 meq/L (ref 135–145)
Total Bilirubin: 0.4 mg/dL (ref 0.3–1.2)

## 2010-02-03 ENCOUNTER — Encounter (INDEPENDENT_AMBULATORY_CARE_PROVIDER_SITE_OTHER): Payer: Self-pay | Admitting: *Deleted

## 2010-02-15 ENCOUNTER — Ambulatory Visit: Payer: Self-pay | Admitting: Infectious Diseases

## 2010-03-22 ENCOUNTER — Ambulatory Visit: Payer: Self-pay | Admitting: Infectious Diseases

## 2010-04-17 ENCOUNTER — Emergency Department (HOSPITAL_COMMUNITY): Admission: EM | Admit: 2010-04-17 | Discharge: 2010-04-17 | Payer: Self-pay | Admitting: Emergency Medicine

## 2010-04-20 ENCOUNTER — Telehealth: Payer: Self-pay | Admitting: Infectious Diseases

## 2010-04-23 ENCOUNTER — Ambulatory Visit (HOSPITAL_COMMUNITY)
Admission: RE | Admit: 2010-04-23 | Discharge: 2010-04-23 | Payer: Self-pay | Source: Home / Self Care | Admitting: Orthopedic Surgery

## 2010-06-22 ENCOUNTER — Encounter (INDEPENDENT_AMBULATORY_CARE_PROVIDER_SITE_OTHER): Payer: Self-pay | Admitting: *Deleted

## 2010-08-03 NOTE — Progress Notes (Signed)
  Phone Note Call from Patient   Caller: Patient Summary of Call: Patient called to inform Dr. Ninetta Lights that he broke his wrist and may have to have surgery this week.  Initial call taken by: Starleen Arms CMA,  April 20, 2010 11:38 AM

## 2010-08-03 NOTE — Miscellaneous (Signed)
Summary: clinical update/ryan white ncadap apprv til 10/02/10  Clinical Lists Changes  Observations: Added new observation of AIDSDAP: Yes 2011 (10/13/2009 10:28)

## 2010-08-03 NOTE — Assessment & Plan Note (Signed)
Summary: f/u 042/tkk   CC:  follow-up visit.  History of Present Illness: 40 yo M with HIV+, rectal warts, anxiety d/o.  CD4 1080, VL <48  (01-28-10). without complaints. "life sucks". was served papers by [employer], I asked him to visit [employer].   Depression History:      The patient denies a depressed mood most of the day and a diminished interest in his usual daily activities.         Preventive Screening-Counseling & Management  Alcohol-Tobacco     Alcohol drinks/day: 1     Alcohol type: wine     Smoking Status: current     Smoking Cessation Counseling: yes     Packs/Day: 1.0     Cans of tobacco/week: no     Passive Smoke Exposure: no  Caffeine-Diet-Exercise     Caffeine use/day: coffee and sodas     Does Patient Exercise: no  Safety-Violence-Falls     Seat Belt Use: yes   Updated Prior Medication List: ATRIPLA 600-200-300 MG TABS (EFAVIRENZ-EMTRICITAB-TENOFOVIR) Take 1 tablet by mouth at bedtime LOMOTIL 2.5-0.025 MG TABS (DIPHENOXYLATE-ATROPINE) Take 1 tablet by mouth four times a day as needed FLONASE 50 MCG/ACT  SUSP (FLUTICASONE PROPIONATE) one spray two times a day  Current Allergies (reviewed today): ! PENICILLIN ! ERYTHROMYCIN ! * CODIENE ! VICODIN ! NIACIN Past History:  Past Medical History: HIV disease 2002 Prev PCP Anxiety Depression DNR  Social History: Current Smoker- 2ppd Alcohol use-yes- 1-2 glasses wine /day  Vital Signs:  Patient profile:   40 year old male Height:      67 inches (170.18 cm) Weight:      135.8 pounds (61.73 kg) BMI:     21.35 Temp:     97.7 degrees F oral Pulse rate:   76 / minute BP sitting:   136 / 93  (left arm)  Vitals Entered By: Baxter Hire) (February 15, 2010 9:00 AM) CC: follow-up visit Pain Assessment Patient in pain? no      Nutritional Status BMI of 19 -24 = normal Nutritional Status Detail appetite is fine per patient  Have you ever been in a relationship where you felt threatened, hurt or  afraid?No   Physical Exam  General:  well-developed, well-nourished, well-hydrated, and underweight appearing.   Eyes:  pupils equal, pupils round, and pupils reactive to light.   Mouth:  pharynx pink and moist and no exudates.   Neck:  no masses.   Lungs:  normal respiratory effort and normal breath sounds.   Heart:  normal rate, regular rhythm, and no murmur.   Abdomen:  soft, non-tender, and normal bowel sounds.     Impression & Recommendations:  Problem # 1:  HIV DISEASE (ICD-042) doing well. taking meds well. wt steady. cont to smoke marijuana daily. offered condoms. 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 denies recurrance or BRBPR.   Problem # 3:  TOBACCO USER (ICD-305.1) counseled to quit  Other Orders: Est. Patient Level IV (04540) Future Orders: T-CD4SP ([facility]) (CD4SP) ... 08/14/2010 T-HIV Viral Load [phone]) ... 08/14/2010 T-Comprehensive Metabolic Panel [id number]) ... 08/14/2010 T-CBC w/Diff ([id number]) ... 08/14/2010 T-RPR (Syphilis) [id number]) ... 08/14/2010 T-Lipid Profile [id number]) ... 08/14/2010         Medication Adherence: 02/15/2010   Adherence to medications reviewed with patient. Counseling to provide adequate adherence provided    Prevention For Positives: 02/15/2010   Safe sex practices discussed with patient. Condoms  offered.

## 2010-08-03 NOTE — Letter (Signed)
Summary: [employer]: Living Will  [employer]: Living Will   Imported By: Florinda Marker 08/21/2009 15:39:55  _____________________________________________________________________  External Attachment:    Type:   Image     Comment:   External Document

## 2010-08-03 NOTE — Miscellaneous (Signed)
Summary: clinical update/ryan white  Clinical Lists Changes  Observations: Added new observation of CASE MGM: Amy Faw (02/03/2010 8:50) Added new observation of PCTFPL: 101.96  (02/03/2010 8:50) Added new observation of INCOMESOURCE: Unemployment  (02/03/2010 8:50) Added new observation of HOUSEINCOME: 27253  (02/03/2010 8:50) Added new observation of FINASSESSDT: 01/28/2010  (02/03/2010 8:50)

## 2010-08-03 NOTE — Miscellaneous (Signed)
Summary: Lab orders   Clinical Lists Changes  Orders: Added new Test order of T-CBC w/Diff [id number]) - Signed Added new Test order of T-CD4SP [facility]) (CD4SP) - Signed Added new Test order of T-Chlamydia  Probe, urine [phone]) - Signed Added new Test order of T-GC Probe, urine [phone]) - Signed Added new Test order of T-Comprehensive Metabolic Panel ([id number]) - Signed Added new Test order of T-RPR (Syphilis) ([id number]) - Signed Added new Test order of T-HIV Viral Load [id number]) - Signed     Process Orders Check Orders Results:     [facility]: ABN not required for this insurance Order queued for requisitioning for [facility]: September 03, 2009 10:11 AM  Tests Sent for requisitioning (September 03, 2009 10:11 AM):     09/03/2009: [facility] -- T-CBC w/Diff [[id number]] (signed)     09/03/2009: [facility] -- [employer], urine [phone] (signed)     09/03/2009: [facility] -- T-GC Probe, urine [phone] (signed)     09/03/2009: [facility] -- T-Comprehensive Metabolic Panel [[id number]] (signed)     09/03/2009: [facility] -- T-RPR (Syphilis) [phone] (signed)     09/03/2009: [facility] -- T-HIV Viral Load [phone] (signed)

## 2010-08-03 NOTE — Assessment & Plan Note (Signed)
Summary: FLU SHOT/VS  Prior Medications: ATRIPLA 600-200-300 MG TABS (EFAVIRENZ-EMTRICITAB-TENOFOVIR) Take 1 tablet by mouth at bedtime LOMOTIL 2.5-0.025 MG TABS (DIPHENOXYLATE-ATROPINE) Take 1 tablet by mouth four times a day as needed FLONASE 50 MCG/ACT  SUSP (FLUTICASONE PROPIONATE) one spray two times a day Current Allergies: ! PENICILLIN ! ERYTHROMYCIN ! * CODIENE ! VICODIN ! NIACIN Immunizations Administered:  Influenza Vaccine # 1:    Vaccine Type: Fluvax Non-MCR    Site: left deltoid    Mfr: novartis    Dose: 0.5 ml    Route: IM    Given by: Jennet Maduro RN    Exp. Date: 10/03/2010    Lot #: [id number]  Flu Vaccine Consent Questions:    Do you have a history of severe allergic reactions to this vaccine? no    Any prior history of allergic reactions to egg and/or gelatin? no    Do you have a sensitivity to the preservative Thimersol? no    Do you have a past history of Guillan-Barre Syndrome? no    Do you currently have an acute febrile illness? no    Have you ever had a severe reaction to latex? no    Vaccine information given and explained to patient? yes  Orders Added: 1)  Influenza Vaccine NON MCR [00028]

## 2010-08-03 NOTE — Progress Notes (Signed)
Summary: refill/mld  Phone Note Call from Patient   Caller: Patient Reason for Call: Refill Medication Summary of Call: Patient calling for a refill for his diarrhea medication. Please send to [facility] on [address].  Initial call taken by: Paulo Fruit  BS,CPht II,MPH,  January 27, 2010 3:36 PM    Prescriptions: LOMOTIL 2.5-0.025 MG TABS (DIPHENOXYLATE-ATROPINE) Take 1 tablet by mouth four times a day as needed  #120 x 2   Entered by:   Paulo Fruit  BS,CPht II,MPH   Authorized by:   Johny Sax MD   Signed by:   Paulo Fruit  BS,CPht II,MPH on 01/27/2010   Method used:   Telephoned to ...       [facility] [phone]* (retail)       [address]       [place], [place]  [address]       Ph: [phone]       Fax:    RxID:   [id number]  Left message on pharmacy physician line.  Paulo Fruit  BS,CPht II,MPH  January 27, 2010 3:40 PM

## 2010-08-03 NOTE — Assessment & Plan Note (Signed)
Summary: CHECKUP/SB.   CC:  check up.  History of Present Illness: 40 yo M with HIV+, rectal warts, anxiety d/o.  CD4 820, VL 125  (09-03-09). feels ok, some vague R shoulder pain. in joint. no heavy lifting. has quit taking anxiolytic and valium. feels fine.   Depression History:      The patient denies a depressed mood most of the day and a diminished interest in his usual daily activities.  The patient denies significant weight loss and insomnia.         Preventive Screening-Counseling & Management  Alcohol-Tobacco     Alcohol drinks/day: 1     Alcohol type: wine     Smoking Status: current     Smoking Cessation Counseling: yes     Packs/Day: 1.0     Cans of tobacco/week: no     Passive Smoke Exposure: no  Caffeine-Diet-Exercise     Caffeine use/day: coffee and sodas     Does Patient Exercise: no     Type of exercise: working     Times/week: 5  Safety-Violence-Falls     Seat Belt Use: yes      Drug Use:  current, marijuana, and helps with appetite.     Updated Prior Medication List: ATRIPLA 600-200-300 MG TABS (EFAVIRENZ-EMTRICITAB-TENOFOVIR) Take 1 tablet by mouth at bedtime LOMOTIL 2.5-0.025 MG TABS (DIPHENOXYLATE-ATROPINE) Take 1 tablet by mouth four times a day as needed FLONASE 50 MCG/ACT  SUSP (FLUTICASONE PROPIONATE) one spray two times a day  Current Allergies (reviewed today): ! PENICILLIN ! ERYTHROMYCIN ! * CODIENE ! VICODIN ! NIACIN Past History:  Past medical, surgical, family and social histories (including risk factors) reviewed, and no changes noted (except as noted below).  Past Medical History: Reviewed history from 01/16/2008 and no changes required. HIV disease Anxiety Depression DNR  Family History: Reviewed history from 01/16/2008 and no changes required. father with alzheimers, traumatic brain injury mother with good health  Social History: Reviewed history from 01/16/2008 and no changes required. Current Smoker- 2ppd Alcohol  use-yes- 1 bottle of wine /day Drug Use:  current, marijuana, helps with appetite  Review of Systems       has had headache, occas takes ibuprofen. chest pain- couple of weeks ago, resolved wtih resting. no n/v, no diaphoresis associated with. does haev episodes of random n/v.   Vital Signs:  Patient profile:   40 year old male Height:      67 inches (170.18 cm) Weight:      138.4 pounds (62.91 kg) BMI:     21.75 Temp:     97.4 degrees F (36.33 degrees C) oral Pulse rate:   88 / minute BP sitting:   141 / 102  (left arm)  Vitals Entered By: Baxter Hire) (September 21, 2009 9:16 AM) CC: check up Pain Assessment Patient in pain? no      Nutritional Status BMI of 19 -24 = normal Nutritional Status Detail appetite is okay per patient  Have you ever been in a relationship where you felt threatened, hurt or afraid?No   Does patient need assistance? Functional Status Self care Ambulation Normal Comments Patient has stopped taking valium and anxiety medications.        Medication Adherence: 09/21/2009   Adherence to medications reviewed with patient. Counseling to provide adequate adherence provided   Prevention For Positives: 09/21/2009   Safe sex practices discussed with patient. Condoms offered.  Physical Exam  General:  well-developed, well-nourished, well-hydrated, and underweight appearing.   Eyes:  pupils equal, pupils round, and pupils reactive to light.   Mouth:  pharynx pink and moist and no exudates.   Neck:  no masses.   Lungs:  normal respiratory effort and normal breath sounds.   Heart:  normal rate, regular rhythm, and no murmur.   Abdomen:  soft, non-tender, normal bowel sounds, and no distention.   Msk:  no deformity of R shoulder. non-tender. some pain into arm with palpation.    Impression & Recommendations:  Problem # 1:  HIV DISEASE (ICD-042) repeated BP 125/75. is doing well. i offered to scan his R shoulder  to look for nerve impingement. he wants to watch and wait. offered condoms. return to clinic 4-5 months.   Problem # 2:  ANXIETY (ICD-300.00) off his meds. appears to be doing well. has f/u with Amy Faw.  The following medications were removed from the medication list:    Valium 5 Mg Tabs (Diazepam) .Marland Kitchen... Take one tablet three times a day as needed    Celexa 40 Mg Tabs (Citalopram hydrobromide) .Marland Kitchen... Take one daily    Celexa 20 Mg Tabs (Citalopram hydrobromide) .Marland Kitchen... 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   Problem # 5:  DIARRHEA (ICD-787.91) will cont the lomotil His updated medication list for this problem includes:    Lomotil 2.5-0.025 Mg Tabs (Diphenoxylate-atropine) .Marland Kitchen... Take 1 tablet by mouth four times a day as needed  Problem # 6:  TOBACCO USER (ICD-305.1) again, counseled to quit.  The following medications were removed from the medication list:    Nicoderm Cq 21 Mg/24hr Pt24 (Nicotine) .Marland Kitchen... Apply as directed for 6 weeks then change to lower strength patch  Other Orders: Est. Patient Level IV (16109) Future Orders: T-CD4SP ([facility]) (CD4SP) ... 12/20/2009 T-HIV Viral Load [id number]) ... 12/20/2009 T-Comprehensive Metabolic Panel [id number]) ... 12/20/2009 T-CBC w/Diff ([id number]) ... 12/20/2009  Prescriptions: FLONASE 50 MCG/ACT  SUSP (FLUTICASONE PROPIONATE) one spray two times a day  #30 days x 3   Entered and Authorized by:   Johny Sax MD   Signed by:   Johny Sax MD on 09/21/2009   Method used:   Electronically to        [facility]. [phone]* (retail)       [address]       [place], [place]  [address]       Ph: [phone]       Fax: [phone]   RxID:   [id number]  Process Orders Check Orders Results:     [facility]: ABN not required  for this insurance Tests Sent for requisitioning (September 21, 2009 9:43 AM):     12/20/2009: [facility] -- T-HIV Viral Load [phone] (signed)     12/20/2009: [facility] -- T-Comprehensive Metabolic Panel [[id number]] (signed)     12/20/2009: [facility] -- [facility] w/Diff [[id number]] (signed)

## 2010-08-05 NOTE — Miscellaneous (Signed)
  Clinical Lists Changes 

## 2010-08-21 ENCOUNTER — Encounter (INDEPENDENT_AMBULATORY_CARE_PROVIDER_SITE_OTHER): Payer: Self-pay | Admitting: *Deleted

## 2010-08-25 NOTE — Miscellaneous (Signed)
  Clinical Lists Changes 

## 2010-09-06 ENCOUNTER — Encounter: Payer: Self-pay | Admitting: Infectious Diseases

## 2010-09-06 ENCOUNTER — Encounter (INDEPENDENT_AMBULATORY_CARE_PROVIDER_SITE_OTHER): Payer: Self-pay | Admitting: *Deleted

## 2010-09-06 ENCOUNTER — Other Ambulatory Visit (INDEPENDENT_AMBULATORY_CARE_PROVIDER_SITE_OTHER): Payer: Self-pay

## 2010-09-06 ENCOUNTER — Other Ambulatory Visit: Payer: Self-pay | Admitting: Infectious Diseases

## 2010-09-06 DIAGNOSIS — B20 Human immunodeficiency virus [HIV] disease: Secondary | ICD-10-CM

## 2010-09-06 LAB — CONVERTED CEMR LAB
AST: 26 units/L (ref 0–37)
Albumin: 4.5 g/dL (ref 3.5–5.2)
BUN: 8 mg/dL (ref 6–23)
Basophils Absolute: 0 10*3/uL (ref 0.0–0.1)
CO2: 24 meq/L (ref 19–32)
Calcium: 8.9 mg/dL (ref 8.4–10.5)
Chloride: 106 meq/L (ref 96–112)
Glucose, Bld: 98 mg/dL (ref 70–99)
HDL: 40 mg/dL (ref >39)
HIV 1 RNA Quant: <20 copies/mL (ref <20)
HIV-1 RNA Quant, Log: <1.3 (ref <1.30)
Hemoglobin: 15.6 g/dL (ref 13.0–17.0)
Lymphocytes Relative: 22 % (ref 12–46)
Lymphs Abs: 2.3 10*3/uL (ref 0.7–4.0)
Monocytes Absolute: 0.7 10*3/uL (ref 0.1–1.0)
Neutro Abs: 7.3 10*3/uL (ref 1.7–7.7)
Potassium: 4.1 meq/L (ref 3.5–5.3)
RBC: 4.78 M/uL (ref 4.22–5.81)
RDW: 14.4 % (ref 11.5–15.5)
Total CHOL/HDL Ratio: 4.5
WBC: 10.3 10*3/uL (ref 4.0–10.5)

## 2010-09-14 NOTE — Miscellaneous (Signed)
Summary: ADAP UPDATE   Clinical Lists Changes  Observations: Added new observation of PAYOR: No Insurance (09/06/2010 12:11) Added new observation of AIDSDAP: Pending-APPROVAL 2012 (09/06/2010 12:11) Added new observation of INCOMESOURCE: UnemploymentFREELANCE WAGES (09/06/2010 12:11) Added new observation of PCTFPL: 151.08  (09/06/2010 12:11) Added new observation of HOUSEINCOME: 04540  (09/06/2010 12:11) Added new observation of FINASSESSDT: 09/06/2010  (09/06/2010 12:11)

## 2010-09-15 LAB — SURGICAL PCR SCREEN
MRSA, PCR: NEGATIVE
Staphylococcus aureus: NEGATIVE

## 2010-09-15 LAB — BASIC METABOLIC PANEL
BUN: 3 mg/dL — ABNORMAL LOW (ref 6–23)
CO2: 28 mEq/L (ref 19–32)
Calcium: 8.6 mg/dL (ref 8.4–10.5)
Chloride: 103 mEq/L (ref 96–112)
Creatinine, Ser: 0.74 mg/dL (ref 0.4–1.5)
GFR calc Af Amer: >60 mL/min (ref >60)
GFR calc non Af Amer: >60 mL/min (ref >60)
Glucose, Bld: 96 mg/dL (ref 70–99)
Potassium: 4.4 mEq/L (ref 3.5–5.1)
Sodium: 136 mEq/L (ref 135–145)

## 2010-09-15 LAB — CBC
HCT: 44.5 % (ref 39.0–52.0)
Hemoglobin: 15.4 g/dL (ref 13.0–17.0)
MCH: 33 pg (ref 26.0–34.0)
MCHC: 34.6 g/dL (ref 30.0–36.0)
MCV: 95.3 fL (ref 78.0–100.0)
Platelets: 241 10*3/uL (ref 150–400)
RBC: 4.67 MIL/uL (ref 4.22–5.81)
RDW: 13.8 % (ref 11.5–15.5)
WBC: 8.1 10*3/uL (ref 4.0–10.5)

## 2010-09-15 LAB — HEPATIC FUNCTION PANEL
ALT: 21 U/L (ref 0–53)
AST: 23 U/L (ref 0–37)
Albumin: 3.5 g/dL (ref 3.5–5.2)
Alkaline Phosphatase: 75 U/L (ref 39–117)
Bilirubin, Direct: 0.2 mg/dL (ref 0.0–0.3)
Indirect Bilirubin: 0.4 mg/dL (ref 0.3–0.9)
Total Bilirubin: 0.6 mg/dL (ref 0.3–1.2)
Total Protein: 5.7 g/dL — ABNORMAL LOW (ref 6.0–8.3)

## 2010-09-15 LAB — POCT I-STAT, CHEM 8
Calcium, Ion: 0.99 mmol/L — ABNORMAL LOW (ref 1.12–1.32)
Chloride: 106 mEq/L (ref 96–112)
HCT: 51 % (ref 39.0–52.0)
Hemoglobin: 17.3 g/dL — ABNORMAL HIGH (ref 13.0–17.0)

## 2010-09-16 ENCOUNTER — Encounter (INDEPENDENT_AMBULATORY_CARE_PROVIDER_SITE_OTHER): Payer: Self-pay | Admitting: *Deleted

## 2010-09-18 LAB — T-HELPER CELL (CD4) - (RCID CLINIC ONLY)
CD4 % Helper T Cell: 37 % (ref 33–55)
CD4 T Cell Abs: 1080 uL (ref 400–2700)

## 2010-09-21 NOTE — Miscellaneous (Signed)
Summary: ADAP APPROVED TIL 04/03/11  Clinical Lists Changes  Observations: Added new observation of AIDSDAP: Yes 2012 (09/16/2010 17:59)

## 2010-09-24 LAB — T-HELPER CELL (CD4) - (RCID CLINIC ONLY): CD4 T Cell Abs: 820 uL (ref 400–2700)

## 2010-09-29 ENCOUNTER — Ambulatory Visit: Payer: Self-pay | Admitting: Infectious Diseases

## 2010-09-29 ENCOUNTER — Telehealth: Payer: Self-pay | Admitting: Infectious Diseases

## 2010-09-29 NOTE — Telephone Encounter (Signed)
Marvin Macias forwarded a message regarding Flonase for Marvin Macias.  An application was completed because it was passed time to re-enroll with Marvin Macias to [employer].  Marvin Macias requested that application be mailed to him.  It was placed in the mail along with a letter.  He is going to drop it by the office when he has completed the portions he needs to complete.Marland KitchenMarland KitchenAntionette Macias

## 2010-10-05 LAB — RAPID URINE DRUG SCREEN, HOSP PERFORMED
Amphetamines: NOT DETECTED
Barbiturates: NOT DETECTED
Opiates: NOT DETECTED
Tetrahydrocannabinol: POSITIVE — AB

## 2010-10-05 LAB — BASIC METABOLIC PANEL
CO2: 25 mEq/L (ref 19–32)
Calcium: 9 mg/dL (ref 8.4–10.5)
Chloride: 104 mEq/L (ref 96–112)
Creatinine, Ser: 0.84 mg/dL (ref 0.4–1.5)
GFR calc Af Amer: >60 mL/min (ref >60)
Glucose, Bld: 96 mg/dL (ref 70–99)

## 2010-10-05 LAB — CBC
Hemoglobin: 15.5 g/dL (ref 13.0–17.0)
MCHC: 33.7 g/dL (ref 30.0–36.0)
MCV: 98.7 fL (ref 78.0–100.0)
RBC: 4.66 MIL/uL (ref 4.22–5.81)
RDW: 14 % (ref 11.5–15.5)

## 2010-10-06 LAB — T-HELPER CELL (CD4) - (RCID CLINIC ONLY): CD4 % Helper T Cell: 37 % (ref 33–55)

## 2010-10-09 LAB — T-HELPER CELL (CD4) - (RCID CLINIC ONLY)
CD4 % Helper T Cell: 36 % (ref 33–55)
CD4 T Cell Abs: 880 uL (ref 400–2700)

## 2010-10-11 ENCOUNTER — Ambulatory Visit: Payer: Self-pay | Admitting: Infectious Diseases

## 2010-11-01 ENCOUNTER — Other Ambulatory Visit: Payer: Self-pay | Admitting: Licensed Clinical Social Worker

## 2010-11-19 NOTE — Op Note (Signed)
NAMECORBIN, FALCK                          ACCOUNT NO.:  [id number]   MEDICAL RECORD NO.:  [id number]                   PATIENT TYPE:  AMB   LOCATION:  DSC                                  FACILITY:  [facility]   PHYSICIAN:  Abigail Miyamoto, M.D.              DATE OF BIRTH:  May 19, 1971   DATE OF PROCEDURE:  09/11/2003  DATE OF DISCHARGE:                                 OPERATIVE REPORT   PREOPERATIVE DIAGNOSIS:  Anal condyloma.   POSTOPERATIVE DIAGNOSIS:  Anal condyloma.   OPERATION PERFORMED:  Excision of perianal condyloma.   SURGEON:  Douglas A. Magnus Ivan, M.D.   ANESTHESIA:  General endotracheal.   ESTIMATED BLOOD LOSS:  Minimal.   OPERATIVE FINDINGS:  The patient was found to have several superficial  condyloma externally and several large ones in the anal canal.   DESCRIPTION OF PROCEDURE:  The patient was brought to the operating room and  identified as [name].  He was placed supine on the operating table and  general anesthesia was induced.  The patient was then placed in a prone  position.  His buttocks were then taped apart and then prepped and draped in  the usual sterile fashion.  Several of the superficial anal condyloma were  then excised easily perianally with the electrocautery.  These were sent to  pathology for identification.  The retractor was inserted into the anal  canal and the patient was found to have three large anal condyloma  throughout the anal canal.  These were each grasped with an Allis clamp and  excised at their base with the electrocautery.  One area had to be closed  with a 3-0 chromic suture to aid with hemostasis.  The rest of the anal  canal appeared normal.  A piece of Gelfoam was then inserted into the anal  canal.  The perianal area was then anesthetized with 0.5% Marcaine with  epinephrine circumferentially.  Dry gauze was then placed.  The patient was  then placed back in a supine position, extubated in the operating room and  taken in stable condition to the recovery room.                                               Abigail Miyamoto, M.D.    DB/MEDQ  D:  09/11/2003  T:  09/12/2003  Job:  [id number]

## 2010-11-19 NOTE — Discharge Summary (Signed)
Marvin Macias, Marvin Macias                ACCOUNT NO.:  [id number]   MEDICAL RECORD NO.:  [id number]          PATIENT TYPE:  INP   LOCATION:  5030                         FACILITY:  [facility]   PHYSICIAN:  Donald Pore, MD       DATE OF BIRTH:  1970/08/05   DATE OF ADMISSION:  09/22/2004  DATE OF DISCHARGE:  09/24/2004                                 DISCHARGE SUMMARY   DISCHARGE DIAGNOSES:  1.  Status post suicide attempt via Xanax overdose.  2.  Human immunodeficiency virus, CD4 count 570 in February of 2006, viral      load less than 400 on highly active antiretroviral therapy.  3.  Anxiety.  4.  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  5.  Adjustment disorder with depressed mood.   DISCHARGE MEDICATIONS:  1.  Kaletra 3 tablets p.o. b.i.d.  2.  Emtriva 1 p.o. daily.  3.  Verex 40 mg p.o. daily.  4.  Megace 400 mg p.o. daily.  5.  Lexapro 10 mg p.o. daily.   HOSPITAL COURSE:  A 40 year old male arrived in the [facility] ED via EMS after  taking approximately 35 1 mg Xanax tablets.  The patient states he has a  prior history of a suicide attempt as a teenager and he also took pills.  The patient complained of financial stressors as well as loneliness  contributing to his suicide attempt.  Initially the patient was very  agitated on admission and appeared frustrated with his friend who notified  EMS of his overdose.  He was placed on suicide precautions.  A sitter was  provided in the room.  Voluntary commitment was pursued.  Psychiatric  consult was called.  Dr. Jeanie Sewer was copied on this.  The following  recommendations were made.  Inpatient treatment, however at the time of Dr.  Providence Crosby consult the patient was deemed no longer committable and refused  inpatient counseling/treatment.  The patient was also referred to outpatient  therapy which he seemed receptive to at the time.  Contact will be made with  his THP counselor as well as  ADS as appropriate to secure counseling for Mr.  Sasaki upon discharge and further one on one discussion with Dr. Jeanie Sewer,  it was recommended that the patient be placed some type of medication for  his anxiety, as he will no longer be given Xanax prescriptions.  An SSRI was  suggested.  A prescription will be written for Lexapro which we will try to  obtain via [facility] account and subsequently through the [facility]  outpatient clinic for [name].   Please note the risk of seizure due to benzodiazepine withdrawal was  discussed with [name] prior to discharge and he opted for outpatient  treatment and discharge as opposed to inpatient treatment with some sort of  benzodiazepine taper.  The full risk of this decision has been discussed  with the patient.     HP/MEDQ  D:  09/24/2004  T:  09/25/2004  Job:  [id number]

## 2010-11-19 NOTE — Op Note (Signed)
   Marvin Macias, Marvin Macias                          ACCOUNT NO.:  [id number]   MEDICAL RECORD NO.:  [id number]                   PATIENT TYPE:  AMB   LOCATION:  DSC                                  FACILITY:  [facility]   PHYSICIAN:  Abigail Miyamoto, M.D.              DATE OF BIRTH:  [birth year]   DATE OF PROCEDURE:  03/13/2003  DATE OF DISCHARGE:                                 OPERATIVE REPORT   PREOPERATIVE DIAGNOSIS:  Perianal condyloma.   POSTOPERATIVE DIAGNOSIS:  Perianal condyloma.   PROCEDURE:  Excision of perianal condyloma.   SURGEON:  Abigail Miyamoto, M.D.   ANESTHESIA:  General endotracheal anesthesia and 0.5% Marcaine with  epinephrine.   ESTIMATED BLOOD LOSS:  Minimal.   PROCEDURE IN DETAIL:  The patient was brought to the operating room,  identified as [name].  He was placed supine on the operating table and  general anesthesia was induced.  The patient was then placed in the  lithotomy position.  The perianal area was then anesthetized with 0.5%  Marcaine with epinephrine.  The patient was found to have very large anal  condylomata circumferentially.  These were excised circumferentially with  electrocautery down slightly through the mucosa and subcutaneous tissue.  All the condylomata were sent to pathology for identification.  Circumferentially the mucosa of the anus was then advanced to cover the  defect with interrupted 3-0 chromic sutures.  The wound was then  anesthetized with the 0.5% Marcaine.  A finger was inserted into the anal  canal and the anal canal appeared patent.  The Hill-Ferguson retractor was  inserted into the anus, and no other intra-anal pathology was identified.  At this point a piece of Gelfoam was inserted to the anal canal.  Gauze and  tape were then placed over the wound.  The patient tolerated the procedure  well.  All sponge, needle, and instrument counts were correct at the end of  the procedure.  The patient was then extubated in  the operating room and  taken in stable condition to the recovery room.                                               Abigail Miyamoto, M.D.    DB/MEDQ  D:  03/13/2003  T:  03/14/2003  Job:  [id number]

## 2010-11-23 ENCOUNTER — Other Ambulatory Visit: Payer: Self-pay | Admitting: *Deleted

## 2010-11-23 DIAGNOSIS — R197 Diarrhea, unspecified: Secondary | ICD-10-CM

## 2010-11-23 MED ORDER — DIPHENOXYLATE-ATROPINE 2.5-0.025 MG PO TABS: 1.0000 | ORAL_TABLET | Freq: Four times a day (QID) | ORAL | Status: DC | PRN

## 2010-11-25 ENCOUNTER — Other Ambulatory Visit: Payer: Self-pay | Admitting: Licensed Clinical Social Worker

## 2010-11-25 DIAGNOSIS — R197 Diarrhea, unspecified: Secondary | ICD-10-CM

## 2010-11-25 MED ORDER — DIPHENOXYLATE-ATROPINE 2.5-0.025 MG PO TABS: 1.0000 | ORAL_TABLET | Freq: Four times a day (QID) | ORAL | Status: DC | PRN

## 2010-12-03 ENCOUNTER — Other Ambulatory Visit: Payer: Self-pay | Admitting: Licensed Clinical Social Worker

## 2010-12-03 DIAGNOSIS — B20 Human immunodeficiency virus [HIV] disease: Secondary | ICD-10-CM

## 2010-12-03 DIAGNOSIS — R197 Diarrhea, unspecified: Secondary | ICD-10-CM

## 2010-12-03 MED ORDER — EFAVIRENZ-EMTRICITAB-TENOFOVIR 600-200-300 MG PO TABS: 1.0000 | ORAL_TABLET | Freq: Every day | ORAL | Status: DC

## 2010-12-03 MED ORDER — DIPHENOXYLATE-ATROPINE 2.5-0.025 MG PO TABS: 1.0000 | ORAL_TABLET | Freq: Four times a day (QID) | ORAL | Status: DC | PRN

## 2011-01-24 ENCOUNTER — Ambulatory Visit: Payer: Self-pay

## 2011-01-25 ENCOUNTER — Telehealth: Payer: Self-pay | Admitting: *Deleted

## 2011-01-25 ENCOUNTER — Other Ambulatory Visit: Payer: Self-pay | Admitting: *Deleted

## 2011-01-25 DIAGNOSIS — R197 Diarrhea, unspecified: Secondary | ICD-10-CM

## 2011-01-25 MED ORDER — DIPHENOXYLATE-ATROPINE 2.5-0.025 MG PO TABS: 1.0000 | ORAL_TABLET | Freq: Four times a day (QID) | ORAL | Status: DC | PRN

## 2011-01-25 NOTE — Telephone Encounter (Signed)
rec'd fax from [facility] specialty pharmacy about a lomotil refill. I called them. He has 2 refills. Asked them to add a label to his bottle that he needs an office visit

## 2011-03-04 ENCOUNTER — Telehealth: Payer: Self-pay | Admitting: Licensed Clinical Social Worker

## 2011-03-04 NOTE — Telephone Encounter (Signed)
Patient called stating that he has an infected hair follicle on his back and his friend that is a nurse "expressed" it and pus came out then blood. He said it is getting red and tender. He wanted an antibiotic called in and I told him he would need an office visit, and we did not have anything available this afternoon. I advised him to go to the urgent care if it was causing pain, appeared red, swollen, and warm to the touch. He wanted to come to this clinic and said he would wait until Tuesday. I scheduled him an appointment for Tuesday 03/08/2011 at 3:30pm

## 2011-03-08 ENCOUNTER — Ambulatory Visit (INDEPENDENT_AMBULATORY_CARE_PROVIDER_SITE_OTHER): Payer: Self-pay | Admitting: Adult Health

## 2011-03-08 ENCOUNTER — Encounter: Payer: Self-pay | Admitting: Adult Health

## 2011-03-08 VITALS — BP 139/92 | HR 99 | Temp 98.7°F | Ht 67.0 in | Wt 134.0 lb

## 2011-03-08 DIAGNOSIS — B20 Human immunodeficiency virus [HIV] disease: Secondary | ICD-10-CM

## 2011-03-08 DIAGNOSIS — Z113 Encounter for screening for infections with a predominantly sexual mode of transmission: Secondary | ICD-10-CM

## 2011-03-08 DIAGNOSIS — L738 Other specified follicular disorders: Secondary | ICD-10-CM

## 2011-03-08 DIAGNOSIS — Z23 Encounter for immunization: Secondary | ICD-10-CM

## 2011-03-08 DIAGNOSIS — L739 Follicular disorder, unspecified: Secondary | ICD-10-CM

## 2011-03-08 DIAGNOSIS — Z79899 Other long term (current) drug therapy: Secondary | ICD-10-CM

## 2011-03-09 LAB — COMPLETE METABOLIC PANEL WITH GFR
ALT: 31 U/L (ref 0–53)
AST: 31 U/L (ref 0–37)
Alkaline Phosphatase: 79 U/L (ref 39–117)
BUN: 12 mg/dL (ref 6–23)
Calcium: 9.2 mg/dL (ref 8.4–10.5)
Creat: 1.03 mg/dL (ref 0.50–1.35)
Total Bilirubin: 0.5 mg/dL (ref 0.3–1.2)

## 2011-03-09 LAB — CBC WITH DIFFERENTIAL/PLATELET
Basophils Absolute: 0 10*3/uL (ref 0.0–0.1)
Basophils Relative: 0 % (ref 0–1)
Eosinophils Absolute: 0.1 10*3/uL (ref 0.0–0.7)
Eosinophils Relative: 1 % (ref 0–5)
HCT: 45.4 % (ref 39.0–52.0)
MCHC: 33.3 g/dL (ref 30.0–36.0)
MCV: 97.4 fL (ref 78.0–100.0)
Monocytes Absolute: 0.6 10*3/uL (ref 0.1–1.0)
Platelets: 238 10*3/uL (ref 150–400)
RDW: 14.1 % (ref 11.5–15.5)

## 2011-03-09 LAB — T-HELPER CELL (CD4) - (RCID CLINIC ONLY)
CD4 % Helper T Cell: 37 % (ref 33–55)
CD4 T Cell Abs: 1130 uL (ref 400–2700)

## 2011-03-09 LAB — GC/CHLAMYDIA PROBE AMP, URINE
Chlamydia, Swab/Urine, PCR: NEGATIVE
GC Probe Amp, Urine: NEGATIVE

## 2011-03-09 LAB — HIV-1 RNA QUANT-NO REFLEX-BLD
HIV 1 RNA Quant: 188 copies/mL — ABNORMAL HIGH (ref <20)
HIV-1 RNA Quant, Log: 2.27 {Log} — ABNORMAL HIGH (ref <1.30)

## 2011-03-09 LAB — LIPID PANEL
Cholesterol: 164 mg/dL (ref 0–200)
VLDL: 32 mg/dL (ref 0–40)

## 2011-03-09 LAB — RPR

## 2011-03-28 ENCOUNTER — Other Ambulatory Visit: Payer: Self-pay | Admitting: Infectious Diseases

## 2011-03-28 DIAGNOSIS — B20 Human immunodeficiency virus [HIV] disease: Secondary | ICD-10-CM

## 2011-03-28 LAB — T-HELPER CELL (CD4) - (RCID CLINIC ONLY): CD4 % Helper T Cell: 36

## 2011-03-31 LAB — T-HELPER CELL (CD4) - (RCID CLINIC ONLY): CD4 T Cell Abs: 630

## 2011-04-04 ENCOUNTER — Ambulatory Visit (INDEPENDENT_AMBULATORY_CARE_PROVIDER_SITE_OTHER): Payer: Self-pay | Admitting: Infectious Diseases

## 2011-04-04 ENCOUNTER — Encounter: Payer: Self-pay | Admitting: Infectious Diseases

## 2011-04-04 DIAGNOSIS — F329 Major depressive disorder, single episode, unspecified: Secondary | ICD-10-CM

## 2011-04-04 DIAGNOSIS — B079 Viral wart, unspecified: Secondary | ICD-10-CM

## 2011-04-04 DIAGNOSIS — R197 Diarrhea, unspecified: Secondary | ICD-10-CM

## 2011-04-04 DIAGNOSIS — B20 Human immunodeficiency virus [HIV] disease: Secondary | ICD-10-CM

## 2011-04-04 MED ORDER — DRONABINOL 5 MG PO CAPS: 5.0000 mg | ORAL_CAPSULE | Freq: Two times a day (BID) | ORAL | Status: DC

## 2011-04-04 NOTE — Assessment & Plan Note (Signed)
Offered to send him to [facility] here in clinic, he declines.

## 2011-04-04 NOTE — Progress Notes (Signed)
  Subjective:    Patient ID: Marvin Macias, male    DOB: April 18, 1971, 40 y.o.   MRN: [id number]  HPI 40 yo M with HIV+, rectal warts, anxiety d/o. R radial fracture with ORIF October 2011.  CD4 1130, VL 188 (03-08-11). Taking meds well. Under some stress as he prepares to move. Has settled with [facility] over his previous bill.    Review of Systems  Constitutional: Negative for unexpected weight change.  Respiratory: Negative for cough and shortness of breath.   Genitourinary: Negative for dysuria.       Objective:   Physical Exam        Assessment & Plan:

## 2011-04-04 NOTE — Assessment & Plan Note (Signed)
He is doing well. He had blip. Taking meds well. Has gotten flushot.offered condoms.  Wants marinol for apetite stimulation. rtc 4-6 months.

## 2011-04-04 NOTE — Assessment & Plan Note (Signed)
Suggested he stop his lomotil for 1 test dose to check status of this.

## 2011-04-04 NOTE — Assessment & Plan Note (Signed)
Offered exam, refused. He has not had surgical f/u in awhile. He states that these are resolved.

## 2011-04-05 ENCOUNTER — Other Ambulatory Visit: Payer: Self-pay | Admitting: *Deleted

## 2011-04-05 LAB — T-HELPER CELL (CD4) - (RCID CLINIC ONLY)
CD4 % Helper T Cell: 33
CD4 T Cell Abs: 940

## 2011-04-05 NOTE — Telephone Encounter (Signed)
Sent off pap for marinol today. Faxed & mailed

## 2011-04-19 ENCOUNTER — Telehealth: Payer: Self-pay | Admitting: Infectious Diseases

## 2011-04-19 NOTE — Telephone Encounter (Signed)
Called [employer].  Application rejected  Needed drug perscription coverage information.  Corrected app and re-faxed

## 2011-04-22 ENCOUNTER — Other Ambulatory Visit: Payer: Self-pay | Admitting: *Deleted

## 2011-04-22 DIAGNOSIS — R197 Diarrhea, unspecified: Secondary | ICD-10-CM

## 2011-04-22 MED ORDER — DIPHENOXYLATE-ATROPINE 2.5-0.025 MG PO TABS: 1.0000 | ORAL_TABLET | Freq: Four times a day (QID) | ORAL | Status: DC | PRN

## 2011-04-25 ENCOUNTER — Other Ambulatory Visit: Payer: Self-pay | Admitting: *Deleted

## 2011-04-25 ENCOUNTER — Telehealth: Payer: Self-pay | Admitting: Infectious Diseases

## 2011-04-25 DIAGNOSIS — R197 Diarrhea, unspecified: Secondary | ICD-10-CM

## 2011-04-25 MED ORDER — DIPHENOXYLATE-ATROPINE 2.5-0.025 MG PO TABS: 1.0000 | ORAL_TABLET | Freq: Four times a day (QID) | ORAL | Status: DC | PRN

## 2011-04-25 NOTE — Telephone Encounter (Signed)
Called [employer] Pt Assistance Program.  App has been approved through 04-20-12.  Marinol shipped to patient's address on 04-21-11.  Spoke with patient.  He received his meds today 04-25-11.

## 2011-04-25 NOTE — Telephone Encounter (Signed)
Previously refilled on 04/22/11.

## 2011-05-20 ENCOUNTER — Other Ambulatory Visit: Payer: Self-pay | Admitting: Licensed Clinical Social Worker

## 2011-05-20 DIAGNOSIS — R64 Cachexia: Secondary | ICD-10-CM

## 2011-05-20 MED ORDER — DRONABINOL 5 MG PO CAPS: 5.0000 mg | ORAL_CAPSULE | Freq: Two times a day (BID) | ORAL | Status: DC

## 2011-05-23 ENCOUNTER — Telehealth: Payer: Self-pay | Admitting: Infectious Diseases

## 2011-05-23 NOTE — Telephone Encounter (Signed)
Called [employer] to check on status of Marinol.  Fax has been received.  Should be shipped within next 3 days.

## 2011-05-23 NOTE — Telephone Encounter (Signed)
Called [employer].  Checked on sta

## 2011-06-21 ENCOUNTER — Other Ambulatory Visit: Payer: Self-pay | Admitting: *Deleted

## 2011-06-21 DIAGNOSIS — R197 Diarrhea, unspecified: Secondary | ICD-10-CM

## 2011-06-21 MED ORDER — DIPHENOXYLATE-ATROPINE 2.5-0.025 MG PO TABS: 1.0000 | ORAL_TABLET | Freq: Four times a day (QID) | ORAL | Status: DC | PRN

## 2011-06-21 NOTE — Telephone Encounter (Signed)
RN spoke with pt to confirm address for delivery of medication.  The address on fax from [facility] was different from the address in EPIC.  Pt stated that he would call the 800# for [facility] to confirm delivery address.

## 2011-06-23 ENCOUNTER — Other Ambulatory Visit: Payer: Self-pay | Admitting: *Deleted

## 2011-06-23 DIAGNOSIS — R197 Diarrhea, unspecified: Secondary | ICD-10-CM

## 2011-06-23 MED ORDER — DIPHENOXYLATE-ATROPINE 2.5-0.025 MG PO TABS: 1.0000 | ORAL_TABLET | Freq: Four times a day (QID) | ORAL | Status: DC | PRN

## 2011-06-23 NOTE — Telephone Encounter (Signed)
Refilled med

## 2011-06-24 ENCOUNTER — Telehealth: Payer: Self-pay | Admitting: Infectious Diseases

## 2011-06-24 NOTE — Telephone Encounter (Signed)
Called [employer] Patient Assistance about refill of Marinol.  Told prescription good for 6 months - one on file had only 1 refill.  Faxed prescription dated 05-20-11 with 5 refills to [employer].

## 2011-07-11 ENCOUNTER — Telehealth: Payer: Self-pay | Admitting: Infectious Diseases

## 2011-07-11 NOTE — Telephone Encounter (Signed)
Called [employer] Patient Assistance about Marvin Macias Marinol.  It is being shipped today.  He should receive it tomorrow.

## 2011-08-03 ENCOUNTER — Other Ambulatory Visit: Payer: Self-pay | Admitting: *Deleted

## 2011-08-03 DIAGNOSIS — R197 Diarrhea, unspecified: Secondary | ICD-10-CM

## 2011-08-03 MED ORDER — DIPHENOXYLATE-ATROPINE 2.5-0.025 MG PO TABS: 1.0000 | ORAL_TABLET | Freq: Four times a day (QID) | ORAL | Status: DC | PRN

## 2011-08-16 DIAGNOSIS — L739 Follicular disorder, unspecified: Secondary | ICD-10-CM | POA: Insufficient documentation

## 2011-08-16 NOTE — Progress Notes (Signed)
Subjective:  Came in to clinic today complaining of recurrent ingrown hairs to his lower back. Sometimes associated tenderness to the area. Denies any areas that appeared be boils in nature. Also, has not had labs for over 6 months and just by the way. Would like to have his labs drawn today.  Review of Systems - General ROS: negative Psychological ROS: negative Ophthalmic ROS: negative ENT ROS: negative Respiratory ROS: no cough, shortness of breath, or wheezing Cardiovascular ROS: no chest pain or dyspnea on exertion Gastrointestinal ROS: no abdominal pain, change in bowel habits, or black or bloody stools Genito-Urinary ROS: no dysuria, trouble voiding, or hematuria Neurological ROS: no TIA or stroke symptoms Dermatological ROS: As per history of present illness   Objective:    General appearance: alert and cooperative Head: Normocephalic, without obvious abnormality, atraumatic Eyes: conjunctivae/corneas clear. PERRL, EOM's intact. Fundi benign. Ears: normal TM's and external ear canals both ears Throat: lips, mucosa, and tongue normal; teeth and gums normal Back: symmetric, no curvature. ROM normal. No CVA tenderness. Resp: clear to auscultation bilaterally Cardio: regular rate and rhythm, S1, S2 normal, no murmur, click, rub or gallop GI: soft, non-tender; bowel sounds normal; no masses,  no organomegaly Extremities: extremities normal, atraumatic, no cyanosis or edema Skin: Skin color, texture, turgor normal. No rashes or lesions or No follicular lesions active to the lower back area. Neurologic: Alert and oriented X 3, normal strength and tone. Normal symmetric reflexes. Normal coordination and gait Psych:  No vegetative signs or delusional behaviors noted.     Assessment/Plan:  HIV DISEASE On Atripla. No staging labs, for over 6 months. His staging labs today with a followup in 2-3 weeks.  Folliculitis Allegedly this is chronic in nature, but on examination. There  were no significant follicular lesions. Recommended he wash the area daily with an antibacterial soap, but not to scrub hard over the area.      Karuna Balducci A. Sundra Aland, MS, [facility] [phone]  08/16/2011,  7:35 PM

## 2011-08-16 NOTE — Assessment & Plan Note (Addendum)
On Atripla. No staging labs, for over 6 months. His staging labs today with a followup in 2-3 weeks.

## 2011-08-16 NOTE — Assessment & Plan Note (Signed)
Allegedly this is chronic in nature, but on examination. There were no significant follicular lesions. Recommended he wash the area daily with an antibacterial soap, but not to scrub hard over the area.

## 2011-08-29 ENCOUNTER — Other Ambulatory Visit (INDEPENDENT_AMBULATORY_CARE_PROVIDER_SITE_OTHER): Payer: Self-pay

## 2011-08-29 ENCOUNTER — Ambulatory Visit: Payer: Self-pay

## 2011-08-29 ENCOUNTER — Other Ambulatory Visit: Payer: Self-pay | Admitting: *Deleted

## 2011-08-29 DIAGNOSIS — R197 Diarrhea, unspecified: Secondary | ICD-10-CM

## 2011-08-29 DIAGNOSIS — Z113 Encounter for screening for infections with a predominantly sexual mode of transmission: Secondary | ICD-10-CM

## 2011-08-29 DIAGNOSIS — Z79899 Other long term (current) drug therapy: Secondary | ICD-10-CM

## 2011-08-29 DIAGNOSIS — B20 Human immunodeficiency virus [HIV] disease: Secondary | ICD-10-CM

## 2011-08-29 LAB — COMPLETE METABOLIC PANEL WITH GFR
BUN: 13 mg/dL (ref 6–23)
CO2: 26 mEq/L (ref 19–32)
Calcium: 9.5 mg/dL (ref 8.4–10.5)
Chloride: 104 mEq/L (ref 96–112)
Creat: 0.84 mg/dL (ref 0.50–1.35)
GFR, Est African American: >89 mL/min
Glucose, Bld: 106 mg/dL — ABNORMAL HIGH (ref 70–99)

## 2011-08-29 LAB — CBC
HCT: 48.1 % (ref 39.0–52.0)
MCV: 98 fL (ref 78.0–100.0)
RBC: 4.91 MIL/uL (ref 4.22–5.81)
WBC: 11.3 10*3/uL — ABNORMAL HIGH (ref 4.0–10.5)

## 2011-08-29 LAB — LIPID PANEL
Cholesterol: 181 mg/dL (ref 0–200)
LDL Cholesterol: 120 mg/dL — ABNORMAL HIGH (ref 0–99)
Triglycerides: 79 mg/dL (ref <150)

## 2011-08-29 MED ORDER — DIPHENOXYLATE-ATROPINE 2.5-0.025 MG PO TABS: 1.0000 | ORAL_TABLET | Freq: Four times a day (QID) | ORAL | Status: DC | PRN

## 2011-08-30 LAB — T-HELPER CELL (CD4) - (RCID CLINIC ONLY): CD4 % Helper T Cell: 36 % (ref 33–55)

## 2011-09-12 ENCOUNTER — Ambulatory Visit: Payer: Self-pay

## 2011-09-12 ENCOUNTER — Encounter: Payer: Self-pay | Admitting: Infectious Diseases

## 2011-09-12 ENCOUNTER — Ambulatory Visit (INDEPENDENT_AMBULATORY_CARE_PROVIDER_SITE_OTHER): Payer: Self-pay | Admitting: Infectious Diseases

## 2011-09-12 VITALS — BP 127/90 | HR 83 | Temp 98.4°F | Ht 66.0 in | Wt 133.0 lb

## 2011-09-12 DIAGNOSIS — B079 Viral wart, unspecified: Secondary | ICD-10-CM

## 2011-09-12 DIAGNOSIS — F411 Generalized anxiety disorder: Secondary | ICD-10-CM

## 2011-09-12 DIAGNOSIS — B20 Human immunodeficiency virus [HIV] disease: Secondary | ICD-10-CM

## 2011-09-12 DIAGNOSIS — Z23 Encounter for immunization: Secondary | ICD-10-CM

## 2011-09-12 NOTE — Progress Notes (Signed)
  Subjective:    Patient ID: Marvin Macias, male    DOB: 1970-08-22, 41 y.o.   MRN: [id number]  HPI 41 yo M with HIV+, rectal warts, anxiety d/o. R radial fracture with ORIF October 2011. On atripla.   HIV 1 RNA Quant (copies/mL)  Date Value  08/29/2011 <20   03/08/2011 188*  09/06/2010 <20 copies/mL      CD4 T Cell Abs (cmm)  Date Value  08/29/2011 730   03/08/2011 1130   09/06/2010 880     Lab Results  Component Value Date   CHOL 181 08/29/2011   HDL 45 08/29/2011   LDLCALC 120* 08/29/2011   TRIG 79 08/29/2011   CHOLHDL 4.0 08/29/2011      Review of Systems  Constitutional: Negative for appetite change and unexpected weight change.  Gastrointestinal: Negative for diarrhea and constipation.  Genitourinary: Negative for dysuria.       Objective:   Physical Exam  Constitutional: He appears well-developed and well-nourished.  HENT:  Mouth/Throat: No oropharyngeal exudate.  Eyes: EOM are normal. Pupils are equal, round, and reactive to light.  Neck: Neck supple. No thyromegaly present.  Cardiovascular: Normal rate, regular rhythm and normal heart sounds.   Pulmonary/Chest: Effort normal and breath sounds normal.  Abdominal: Soft. Bowel sounds are normal. He exhibits no distension. There is no tenderness.  Lymphadenopathy:    He has no cervical adenopathy.          Assessment & Plan:

## 2011-09-12 NOTE — Assessment & Plan Note (Addendum)
Still occas uses topical he got from surgery? (aldara?). Asked him to bring in at next visit.

## 2011-09-12 NOTE — Assessment & Plan Note (Signed)
He is doing very well. Will see him back in 6 months with labs prior. vax up to date. Given condoms.

## 2011-09-12 NOTE — Assessment & Plan Note (Signed)
He is doing better than in past. Is trying to make peace with his new life style, lower income.

## 2011-10-04 ENCOUNTER — Other Ambulatory Visit: Payer: Self-pay | Admitting: *Deleted

## 2011-10-04 DIAGNOSIS — R197 Diarrhea, unspecified: Secondary | ICD-10-CM

## 2011-10-04 MED ORDER — DIPHENOXYLATE-ATROPINE 2.5-0.025 MG PO TABS: 1.0000 | ORAL_TABLET | Freq: Four times a day (QID) | ORAL | Status: DC | PRN

## 2011-10-13 IMAGING — CT CT HEAD W/O CM
1 series · 1 of 1 positions shown · non-contrast
Comparison: Head CT 06/17/2009.

CT HEAD

CLINICAL DATA: 39-year-old male status post fall from horse.
Pain.

CT HEAD WITHOUT CONTRAST
CT CERVICAL SPINE WITHOUT CONTRAST
TECHNIQUE: Multidetector CT imaging of the head and cervical spine
was performed following the standard protocol without intravenous
contrast.  Multiplanar CT image reconstructions of the cervical
spine were also generated.

[Series 1: scout · sagittal · 0.6mm · 0.98mm/px · 1 of 1 slices shown]
[im 1/1]
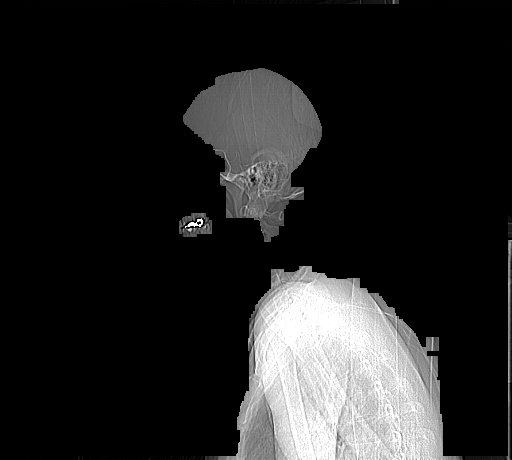

[1 of 1 positions shown; findings below may reference images not displayed]

FINDINGS: Visualized orbits and scalp soft tissues are within
normal limits.  Small mucous retention cyst right maxillary sinus
is unchanged.  Other Visualized paranasal sinuses and mastoids are
clear.  No acute osseous abnormality identified.

Cerebral volume is within normal limits for age.  No midline shift,
ventriculomegaly, mass effect, evidence of mass lesion,
intracranial hemorrhage or evidence of cortically based acute
infarction.  Gray-white matter differentiation is within normal
limits throughout the brain.  No suspicious intracranial vascular
hyperdensity.
IMPRESSION: Stable, normal noncontrast CT appearance of the brain.

CT CERVICAL SPINE
FINDINGS: Visualized lung apices are clear aside from minor
paraseptal emphysema on the left. Visualized paraspinal soft
tissues are within normal limits.  Preserved cervical lordosis.
Visualized skull base is intact.  No atlanto-occipital
dissociation.  Cervicothoracic junction alignment is within normal
limits.  Bilateral posterior element alignment is within normal
limits.  No acute cervical fracture.
IMPRESSION: No acute fracture or listhesis identified in the cervical spine.
Ligamentous injury is not excluded.

## 2011-10-13 IMAGING — CR DG FOREARM 2V*R*
2 series · 2 of 2 positions shown · non-contrast
Comparison: Wrist films.

CLINICAL DATA: Fall.  Right wrist deformity.  Fracture.

RIGHT FOREARM - 2 VIEW

[x forearm lat right]
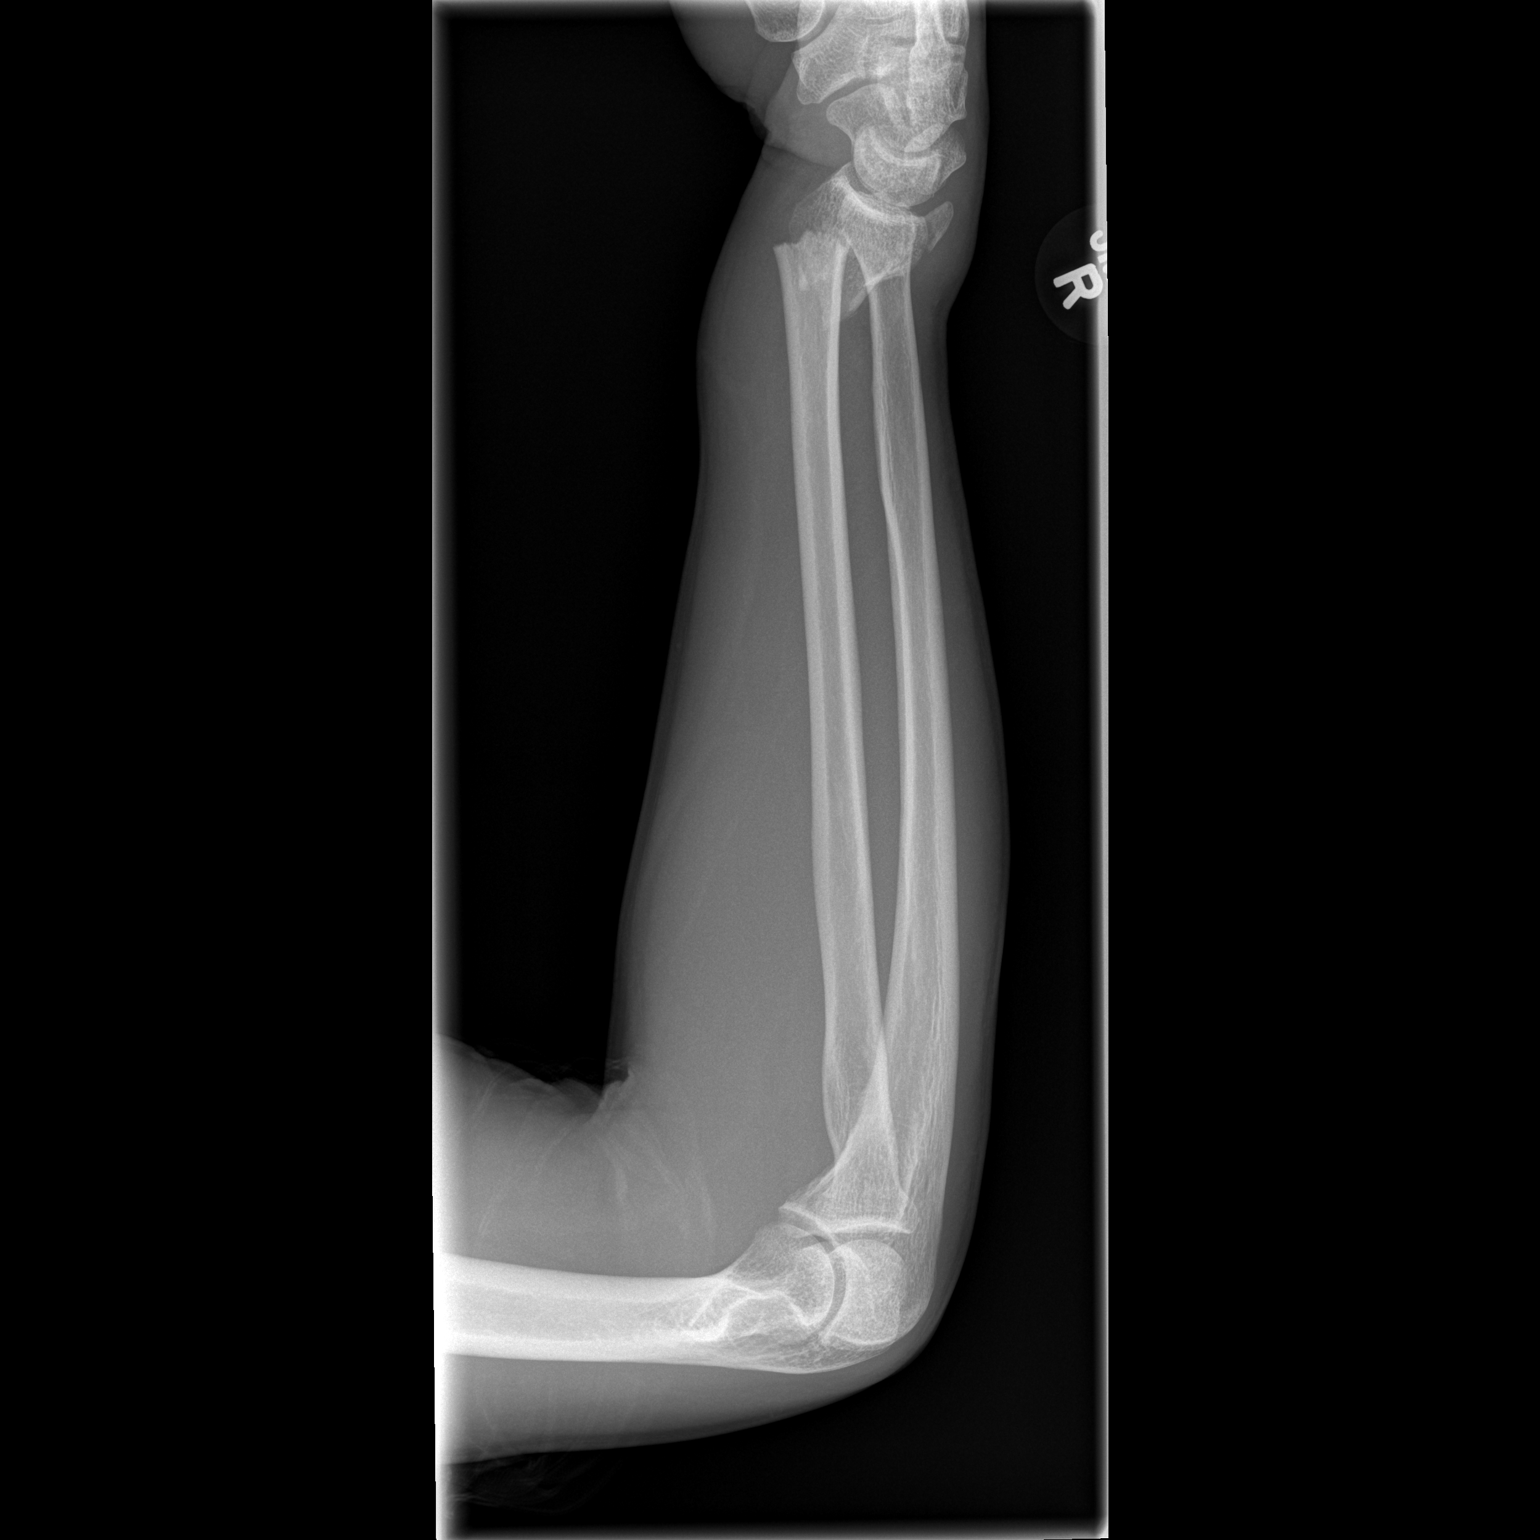

[x forearm ap right]
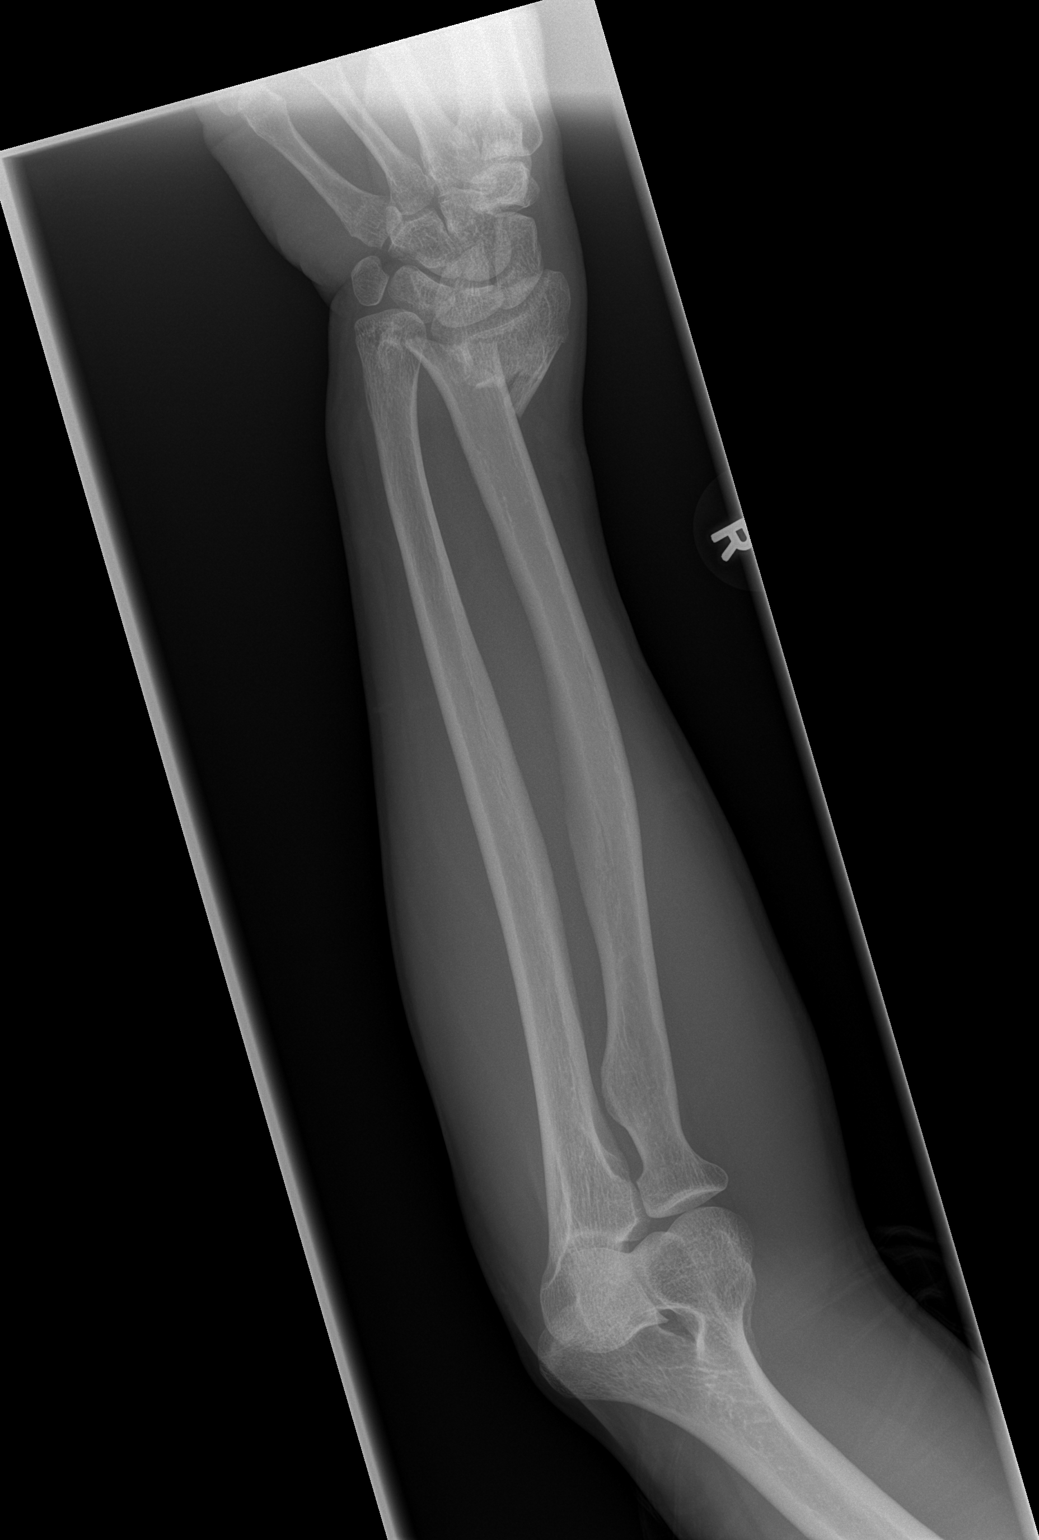

[2 of 2 positions shown; findings below may reference images not displayed]

FINDINGS: Comminuted distal radius fracture has been previously
described.  Ulnar styloid fracture is also noted.  Proximal radius
and ulna appear intact.  Proximal radial ulnar joint appears
normal.
IMPRESSION: Distal radius and ulna fractures described on wrist film.

## 2011-11-30 ENCOUNTER — Other Ambulatory Visit: Payer: Self-pay | Admitting: *Deleted

## 2011-11-30 DIAGNOSIS — B20 Human immunodeficiency virus [HIV] disease: Secondary | ICD-10-CM

## 2011-11-30 MED ORDER — EFAVIRENZ-EMTRICITAB-TENOFOVIR 600-200-300 MG PO TABS: 1.0000 | ORAL_TABLET | Freq: Every day | ORAL | Status: DC

## 2011-12-27 ENCOUNTER — Other Ambulatory Visit: Payer: Self-pay | Admitting: Infectious Diseases

## 2011-12-27 ENCOUNTER — Other Ambulatory Visit: Payer: Self-pay | Admitting: *Deleted

## 2011-12-27 DIAGNOSIS — R197 Diarrhea, unspecified: Secondary | ICD-10-CM

## 2011-12-27 DIAGNOSIS — B20 Human immunodeficiency virus [HIV] disease: Secondary | ICD-10-CM

## 2011-12-27 MED ORDER — DIPHENOXYLATE-ATROPINE 2.5-0.025 MG PO TABS: 1.0000 | ORAL_TABLET | Freq: Four times a day (QID) | ORAL | Status: DC | PRN

## 2012-02-29 ENCOUNTER — Telehealth: Payer: Self-pay | Admitting: *Deleted

## 2012-02-29 ENCOUNTER — Other Ambulatory Visit: Payer: Self-pay | Admitting: Infectious Diseases

## 2012-02-29 DIAGNOSIS — R197 Diarrhea, unspecified: Secondary | ICD-10-CM

## 2012-02-29 MED ORDER — DIPHENOXYLATE-ATROPINE 2.5-0.025 MG PO TABS: 1.0000 | ORAL_TABLET | Freq: Four times a day (QID) | ORAL | Status: DC | PRN

## 2012-02-29 NOTE — Telephone Encounter (Signed)
Pt seems to use at least 120 tablets each month.  Requested that pt discuss usage at next office visit w/ Dr. Ninetta Lights on Oct 1st.

## 2012-03-07 ENCOUNTER — Ambulatory Visit: Payer: Self-pay

## 2012-03-07 ENCOUNTER — Other Ambulatory Visit (INDEPENDENT_AMBULATORY_CARE_PROVIDER_SITE_OTHER): Payer: Self-pay

## 2012-03-07 DIAGNOSIS — B20 Human immunodeficiency virus [HIV] disease: Secondary | ICD-10-CM

## 2012-03-07 LAB — COMPREHENSIVE METABOLIC PANEL
ALT: 43 U/L (ref 0–53)
AST: 26 U/L (ref 0–37)
CO2: 27 mEq/L (ref 19–32)
Sodium: 138 mEq/L (ref 135–145)
Total Bilirubin: 0.3 mg/dL (ref 0.3–1.2)
Total Protein: 6.7 g/dL (ref 6.0–8.3)

## 2012-03-07 LAB — CBC
MCH: 31.9 pg (ref 26.0–34.0)
Platelets: 243 10*3/uL (ref 150–400)
RBC: 4.95 MIL/uL (ref 4.22–5.81)
WBC: 8.8 10*3/uL (ref 4.0–10.5)

## 2012-03-08 LAB — T-HELPER CELL (CD4) - (RCID CLINIC ONLY): CD4 T Cell Abs: 800 uL (ref 400–2700)

## 2012-03-09 LAB — HIV-1 RNA QUANT-NO REFLEX-BLD: HIV 1 RNA Quant: <20 copies/mL (ref <20)

## 2012-03-20 ENCOUNTER — Other Ambulatory Visit: Payer: Self-pay

## 2012-03-28 ENCOUNTER — Ambulatory Visit: Payer: Self-pay | Admitting: Infectious Diseases

## 2012-03-30 ENCOUNTER — Other Ambulatory Visit: Payer: Self-pay | Admitting: Infectious Diseases

## 2012-04-03 ENCOUNTER — Encounter: Payer: Self-pay | Admitting: Infectious Diseases

## 2012-04-03 ENCOUNTER — Ambulatory Visit (INDEPENDENT_AMBULATORY_CARE_PROVIDER_SITE_OTHER): Payer: Self-pay | Admitting: Infectious Diseases

## 2012-04-03 ENCOUNTER — Ambulatory Visit: Payer: Self-pay | Admitting: Infectious Diseases

## 2012-04-03 VITALS — BP 146/87 | HR 71 | Temp 98.0°F | Ht 67.0 in | Wt 135.5 lb

## 2012-04-03 DIAGNOSIS — Z79899 Other long term (current) drug therapy: Secondary | ICD-10-CM

## 2012-04-03 DIAGNOSIS — R197 Diarrhea, unspecified: Secondary | ICD-10-CM

## 2012-04-03 DIAGNOSIS — Z113 Encounter for screening for infections with a predominantly sexual mode of transmission: Secondary | ICD-10-CM

## 2012-04-03 DIAGNOSIS — B20 Human immunodeficiency virus [HIV] disease: Secondary | ICD-10-CM

## 2012-04-03 DIAGNOSIS — Z23 Encounter for immunization: Secondary | ICD-10-CM

## 2012-04-03 MED ORDER — DRONABINOL 5 MG PO CAPS: 5.0000 mg | ORAL_CAPSULE | Freq: Two times a day (BID) | ORAL | Status: DC

## 2012-04-03 MED ORDER — DIPHENOXYLATE-ATROPINE 2.5-0.025 MG PO TABS: 1.0000 | ORAL_TABLET | Freq: Four times a day (QID) | ORAL | Status: DC | PRN

## 2012-04-03 NOTE — Progress Notes (Signed)
  Subjective:    Patient ID: Marvin Macias, male    DOB: Feb 28, 1971, 41 y.o.   MRN: [id number]  HPI 41 yo M with HIV+, rectal warts, anxiety d/o. R radial fracture with ORIF October 2011.  On atripla. Doing well, continues to have diarrhea. Needs marinol for diarrhea.  Has rash on hands (small blisters, scabs). Has been working in yard, Scientist, water quality. No new soaps, shampoos, perfume, detergents.  States he is DNR if he has a MI or CVA.   HIV 1 RNA Quant (copies/mL)  Date Value  03/07/2012 <20   08/29/2011 <20   03/08/2011 188*     CD4 T Cell Abs (cmm)  Date Value  03/07/2012 800   08/29/2011 730   03/08/2011 1130       Review of Systems  Constitutional: Negative for appetite change and unexpected weight change.  Gastrointestinal: Positive for diarrhea.  Genitourinary: Negative for dysuria.       Objective:   Physical Exam  Constitutional: He appears well-developed and well-nourished.  Eyes: EOM are normal. Pupils are equal, round, and reactive to light.  Neck: Neck supple.  Cardiovascular: Normal rate, regular rhythm and normal heart sounds.   Pulmonary/Chest: Effort normal and breath sounds normal.  Abdominal: Soft. Bowel sounds are normal. He exhibits no distension.  Musculoskeletal: He exhibits no edema.  Lymphadenopathy:    He has no cervical adenopathy.  Skin:             Assessment & Plan:

## 2012-04-03 NOTE — Assessment & Plan Note (Signed)
He is doing very well. He is given flu shot and condoms. Will cont his current meds. Refill his diarrhea medication and his appetite stimulant. He does also occas smoke marijuana. He is excited about new online portal. rtc 6 months.

## 2012-04-05 ENCOUNTER — Telehealth: Payer: Self-pay | Admitting: Infectious Diseases

## 2012-04-05 NOTE — Telephone Encounter (Signed)
Mailed [employer] application to Marvin Macias for him to sign and return

## 2012-05-02 ENCOUNTER — Other Ambulatory Visit: Payer: Self-pay | Admitting: Infectious Diseases

## 2012-05-03 ENCOUNTER — Other Ambulatory Visit: Payer: Self-pay | Admitting: *Deleted

## 2012-05-03 DIAGNOSIS — R197 Diarrhea, unspecified: Secondary | ICD-10-CM

## 2012-05-03 MED ORDER — DIPHENOXYLATE-ATROPINE 2.5-0.025 MG PO TABS: 1.0000 | ORAL_TABLET | Freq: Four times a day (QID) | ORAL | Status: DC | PRN

## 2012-05-04 ENCOUNTER — Other Ambulatory Visit: Payer: Self-pay | Admitting: *Deleted

## 2012-05-04 DIAGNOSIS — R197 Diarrhea, unspecified: Secondary | ICD-10-CM

## 2012-05-04 MED ORDER — DIPHENOXYLATE-ATROPINE 2.5-0.025 MG PO TABS: 1.0000 | ORAL_TABLET | Freq: Four times a day (QID) | ORAL | Status: DC | PRN

## 2012-05-23 ENCOUNTER — Telehealth: Payer: Self-pay | Admitting: Infectious Diseases

## 2012-05-23 NOTE — Telephone Encounter (Signed)
Called Marvin Macias and told him I cannot use his faxed application for the Marinol.  Denyse Amass will not accept a faxed copy from a fax.  It has to be the original.  He said he faxed them the original.  There is no doctor's signature on the copy I have. I told him he needs to come in and sign another application.

## 2012-05-29 ENCOUNTER — Other Ambulatory Visit: Payer: Self-pay | Admitting: Licensed Clinical Social Worker

## 2012-05-29 DIAGNOSIS — B20 Human immunodeficiency virus [HIV] disease: Secondary | ICD-10-CM

## 2012-05-29 MED ORDER — DRONABINOL 5 MG PO CAPS: 5.0000 mg | ORAL_CAPSULE | Freq: Two times a day (BID) | ORAL | Status: DC

## 2012-06-06 ENCOUNTER — Telehealth: Payer: Self-pay | Admitting: Infectious Diseases

## 2012-06-06 NOTE — Telephone Encounter (Signed)
Re-faxed all of Marvin Macias information to [place] today.  Application does have doctor's signature.  All information had been previously sent to [employer].

## 2012-07-25 ENCOUNTER — Other Ambulatory Visit: Payer: Self-pay | Admitting: Infectious Diseases

## 2012-07-25 ENCOUNTER — Other Ambulatory Visit: Payer: Self-pay | Admitting: *Deleted

## 2012-07-25 DIAGNOSIS — R197 Diarrhea, unspecified: Secondary | ICD-10-CM

## 2012-07-25 MED ORDER — DIPHENOXYLATE-ATROPINE 2.5-0.025 MG PO TABS: 1.0000 | ORAL_TABLET | Freq: Four times a day (QID) | ORAL | Status: DC | PRN

## 2012-09-18 ENCOUNTER — Other Ambulatory Visit: Payer: Self-pay

## 2012-09-18 ENCOUNTER — Other Ambulatory Visit (INDEPENDENT_AMBULATORY_CARE_PROVIDER_SITE_OTHER): Payer: Self-pay

## 2012-09-18 DIAGNOSIS — Z113 Encounter for screening for infections with a predominantly sexual mode of transmission: Secondary | ICD-10-CM

## 2012-09-18 DIAGNOSIS — B20 Human immunodeficiency virus [HIV] disease: Secondary | ICD-10-CM

## 2012-09-18 LAB — LIPID PANEL
Cholesterol: 194 mg/dL (ref 0–200)
HDL: 34 mg/dL — ABNORMAL LOW (ref >39)
Triglycerides: 114 mg/dL (ref <150)

## 2012-09-18 LAB — COMPREHENSIVE METABOLIC PANEL
Albumin: 4.1 g/dL (ref 3.5–5.2)
Alkaline Phosphatase: 81 U/L (ref 39–117)
BUN: 8 mg/dL (ref 6–23)
CO2: 27 mEq/L (ref 19–32)
Glucose, Bld: 98 mg/dL (ref 70–99)
Potassium: 4.3 mEq/L (ref 3.5–5.3)
Sodium: 137 mEq/L (ref 135–145)
Total Protein: 6.3 g/dL (ref 6.0–8.3)

## 2012-09-18 LAB — CBC
HCT: 44.7 % (ref 39.0–52.0)
Hemoglobin: 15.3 g/dL (ref 13.0–17.0)
MCH: 31 pg (ref 26.0–34.0)
MCHC: 34.2 g/dL (ref 30.0–36.0)
RBC: 4.93 MIL/uL (ref 4.22–5.81)

## 2012-09-19 LAB — T-HELPER CELL (CD4) - (RCID CLINIC ONLY): CD4 % Helper T Cell: 36 % (ref 33–55)

## 2012-09-20 LAB — HIV-1 RNA QUANT-NO REFLEX-BLD
HIV 1 RNA Quant: 102 copies/mL — ABNORMAL HIGH (ref <20)
HIV-1 RNA Quant, Log: 2.01 {Log} — ABNORMAL HIGH (ref <1.30)

## 2012-10-02 ENCOUNTER — Ambulatory Visit: Payer: Self-pay | Admitting: Infectious Diseases

## 2012-10-02 ENCOUNTER — Encounter: Payer: Self-pay | Admitting: *Deleted

## 2012-10-03 ENCOUNTER — Ambulatory Visit: Payer: Self-pay | Admitting: Infectious Diseases

## 2012-10-15 ENCOUNTER — Encounter: Payer: Self-pay | Admitting: Infectious Diseases

## 2012-10-15 ENCOUNTER — Ambulatory Visit (INDEPENDENT_AMBULATORY_CARE_PROVIDER_SITE_OTHER): Payer: Self-pay | Admitting: Infectious Diseases

## 2012-10-15 VITALS — BP 142/93 | HR 77 | Temp 98.5°F | Ht 67.0 in | Wt 136.0 lb

## 2012-10-15 DIAGNOSIS — B079 Viral wart, unspecified: Secondary | ICD-10-CM

## 2012-10-15 DIAGNOSIS — B20 Human immunodeficiency virus [HIV] disease: Secondary | ICD-10-CM
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
## 2012-10-15 NOTE — Assessment & Plan Note (Addendum)
Despite having a blip, he is doing very well. Will continue atripla, could consider change due to loose BM? He will continue marinol and lomotil. He is offered condoms, refuses but does take lube. Will see him back with labs prior, in 6 months.

## 2012-10-15 NOTE — Assessment & Plan Note (Signed)
i offered, recommended that he have HRA. He would like to defer for now, thinking about procedure.

## 2012-10-18 ENCOUNTER — Telehealth: Payer: Self-pay | Admitting: Licensed Clinical Social Worker

## 2012-10-18 NOTE — Telephone Encounter (Signed)
Pt changed his mind and has decided to have the procedure done.   Please make referral with [facility] .  Tammy Gorden Harms, RN

## 2012-10-22 ENCOUNTER — Encounter: Payer: Self-pay | Admitting: *Deleted

## 2012-10-25 ENCOUNTER — Other Ambulatory Visit: Payer: Self-pay | Admitting: Infectious Diseases

## 2012-10-25 ENCOUNTER — Other Ambulatory Visit: Payer: Self-pay | Admitting: Licensed Clinical Social Worker

## 2012-10-25 DIAGNOSIS — A630 Anogenital (venereal) warts: Secondary | ICD-10-CM

## 2012-10-25 DIAGNOSIS — R197 Diarrhea, unspecified: Secondary | ICD-10-CM

## 2012-10-25 MED ORDER — DIPHENOXYLATE-ATROPINE 2.5-0.025 MG PO TABS: 1.0000 | ORAL_TABLET | Freq: Four times a day (QID) | ORAL | Status: DC | PRN

## 2012-10-29 ENCOUNTER — Telehealth: Payer: Self-pay | Admitting: *Deleted

## 2012-10-29 NOTE — Telephone Encounter (Signed)
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 Appt 11/12/12 at 2:15 pm, patient notified and office notes faxed to [phone]. Wendall Mola

## 2012-12-04 ENCOUNTER — Telehealth: Payer: Self-pay | Admitting: Licensed Clinical Social Worker

## 2012-12-04 NOTE — Telephone Encounter (Signed)
Patient called stating that he wanted something for nerves and anxiety due to a relative going to hospice. He said he doesn't want a lot just something temporary to get him through this time.

## 2012-12-26 ENCOUNTER — Other Ambulatory Visit: Payer: Self-pay | Admitting: *Deleted

## 2012-12-26 DIAGNOSIS — B20 Human immunodeficiency virus [HIV] disease: Secondary | ICD-10-CM

## 2012-12-26 DIAGNOSIS — R197 Diarrhea, unspecified: Secondary | ICD-10-CM

## 2012-12-26 MED ORDER — DIPHENOXYLATE-ATROPINE 2.5-0.025 MG PO TABS: 1.0000 | ORAL_TABLET | Freq: Four times a day (QID) | ORAL | Status: DC | PRN

## 2012-12-26 MED ORDER — EFAVIRENZ-EMTRICITAB-TENOFOVIR 600-200-300 MG PO TABS: 1.0000 | ORAL_TABLET | Freq: Every day | ORAL | Status: DC

## 2013-01-21 ENCOUNTER — Telehealth: Payer: Self-pay

## 2013-01-21 NOTE — Telephone Encounter (Signed)
Pt states he has bump on penis with hair present. Area is tender with knott present.  He states he has this last year and was given medication.  Please advise.  Laurell Josephs, RN

## 2013-01-21 NOTE — Telephone Encounter (Signed)
Medical records searched for previous treatment.  documented folliculitis for back once with Traci Sermon, MD.  Pt will need office visit for physician to evaluate.   Laurell Josephs, RN

## 2013-01-24 ENCOUNTER — Encounter: Payer: Self-pay | Admitting: Internal Medicine

## 2013-01-24 ENCOUNTER — Ambulatory Visit: Payer: Self-pay

## 2013-01-24 ENCOUNTER — Ambulatory Visit (INDEPENDENT_AMBULATORY_CARE_PROVIDER_SITE_OTHER): Payer: Self-pay | Admitting: Internal Medicine

## 2013-01-24 VITALS — BP 118/81 | HR 87 | Temp 97.6°F | Wt 131.0 lb

## 2013-01-24 DIAGNOSIS — L905 Scar conditions and fibrosis of skin: Secondary | ICD-10-CM

## 2013-01-24 NOTE — Progress Notes (Signed)
[facility] HIV CLINIC NOTE  RFV: ingrown hair on shaft of penis Subjective:    Patient ID: Marvin Macias, male    DOB: 06-14-1971, 42 y.o.   MRN: [id number]  HPI 42yo M with HIV, CD 4 count 1000/ LV 102, on atripla (march), who reports having shaving his genitals and occasionally getting ingrown hair. He has noticed for several weeks that he had a lesion on the left side of penis that was c/w ingrown hair. It developed a whitish head, which he popped but the area has not completely healed. He still notices a raised lesion on the left side of shaft of penis. He comes to clinic for evaluation. No tenderness, no exudate or drainage, no vesicle or ulcer, no fevers, chills, on inguinalopathy  Current Outpatient Prescriptions on File Prior to Visit  Medication Sig Dispense Refill  . diphenoxylate-atropine (LOMOTIL) 2.5-0.025 MG per tablet Take 1 tablet by mouth 4 (four) times daily as needed for diarrhea or loose stools.  120 tablet  2  . dronabinol (MARINOL) 5 MG capsule Take 1 capsule (5 mg total) by mouth 2 (two) times daily before a meal.  90 capsule  3  . efavirenz-emtricitabine-tenofovir (ATRIPLA) 600-200-300 MG per tablet Take 1 tablet by mouth daily.  30 tablet  12  . fluticasone (FLONASE) 50 MCG/ACT nasal spray Place 2 sprays into the nose daily.        No current facility-administered medications on file prior to visit.   Active Ambulatory Problems    Diagnosis Date Noted  . HIV DISEASE 08/09/2006  . 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 INFECTION, VIRAL NOS 10/11/2006  . ANXIETY 08/09/2006  . TOBACCO USER 02/05/2007  . DEPRESSION 08/09/2006  . ACUTE BRONCHITIS 09/19/2007  . CELLULITIS/ABSCESS, SITE NEC 10/16/2006  . DIARRHEA 10/11/2006  . WASTING SYNDROME 11/19/2008  . INJURY NOS, SITE NOS 10/15/2004  . Folliculitis 08/16/2011   Resolved Ambulatory Problems    Diagnosis Date Noted  . No Resolved Ambulatory Problems   Past Medical History  Diagnosis Date  . HIV infection   . Anxiety   .  Depression        Review of Systems 12 point ROS, "meat locker cold in this room"    Objective:   Physical Exam BP 118/81  Pulse 87  Temp(Src) 97.6 F (36.4 C) (Oral)  Wt 131 lb (59.421 kg)  BMI 20.51 kg/m2 GU = groin area is shaved, left side has benign scar tissue, no fluctuance, no pore of hairfollicle that can be seen, no erythema or tenderness no rash       Assessment & Plan:  Benign scar tissue on left side of penis. No need for antibiotics

## 2013-03-05 ENCOUNTER — Ambulatory Visit: Payer: Self-pay | Admitting: *Deleted

## 2013-03-05 ENCOUNTER — Telehealth: Payer: Self-pay | Admitting: *Deleted

## 2013-03-05 DIAGNOSIS — F329 Major depressive disorder, single episode, unspecified: Secondary | ICD-10-CM

## 2013-03-05 DIAGNOSIS — F411 Generalized anxiety disorder: Secondary | ICD-10-CM

## 2013-03-05 NOTE — Progress Notes (Signed)
Patient ID: Marvin Macias, male   DOB: May 11, 1971, 42 y.o.   MRN: [id number] Patient referred to counselor for evaluation and support. Reported to be experiencing high degree of family stress and had alluded in conversation with [facility] nurse to vague SI with no plan or intent. The absence of plan or intent was confirmed through 1:1 session today. Brandun shared feeling stressed and anxious over building tension between he and his sister. Their father is very ill and they share in his care. This has heightened the sense of conflict between them further exacerbated by patient's financial worries. Counselor reviewed variety of options for helping client achieve greater stabilization including inpatient crisis placement, outpatient therapy, possible psychotropic per Dr. Ninetta Lights. Robertson did not want to enter inpatient but expressed a willingness to engage in OP counseling with Clinical research associate and Franne Forts of [employer] (at [facility] [employer]). Writer agreed to also explore case management support availability and to speak with patient's doctor about possible short-term anti-anxiety medication. There is no known history of substance abuse.   Today's session used to educate patient on role of therapy in increasing personal coping skills. Patient was receptive, at times tearful, but demonstrated an openness to exploring potential solutions. Indicated too that he takes his HIV medications regularly and is interested in feeling better and having an improved quality of life. Ct provided verbal agreement to no harm and agreed to contact the clinic and or 911 should he feel overwhelmed or at risk. Writer agreed to contact patient Thursday via phone and Cleland will return to [facility] next Tuesday for follow-up appt and additional planning.   Fabiola Mudgett, [facility]
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
## 2013-03-06 ENCOUNTER — Other Ambulatory Visit: Payer: Self-pay | Admitting: Infectious Diseases

## 2013-03-06 ENCOUNTER — Telehealth: Payer: Self-pay | Admitting: *Deleted

## 2013-03-06 DIAGNOSIS — F411 Generalized anxiety disorder: Secondary | ICD-10-CM

## 2013-03-06 MED ORDER — CLONAZEPAM 0.5 MG PO TABS: 0.5000 mg | ORAL_TABLET | Freq: Two times a day (BID) | ORAL | Status: DC | PRN

## 2013-03-06 NOTE — Telephone Encounter (Signed)
Clonazepam rx called to [facility].  Cost is $9.00.  Pt notified and coming to pick up rx.  Pt stated that his father is in a nursing home now.

## 2013-03-06 NOTE — Telephone Encounter (Signed)
rx for klonopin in printer

## 2013-03-07 ENCOUNTER — Telehealth: Payer: Self-pay | Admitting: *Deleted

## 2013-03-12 ENCOUNTER — Ambulatory Visit: Payer: Self-pay | Admitting: *Deleted

## 2013-03-13 ENCOUNTER — Ambulatory Visit: Payer: Self-pay | Admitting: *Deleted

## 2013-03-14 ENCOUNTER — Ambulatory Visit: Payer: Self-pay | Admitting: *Deleted

## 2013-03-14 ENCOUNTER — Ambulatory Visit (INDEPENDENT_AMBULATORY_CARE_PROVIDER_SITE_OTHER): Payer: Self-pay | Admitting: Infectious Disease

## 2013-03-14 ENCOUNTER — Encounter: Payer: Self-pay | Admitting: Infectious Disease

## 2013-03-14 ENCOUNTER — Other Ambulatory Visit: Payer: Self-pay | Admitting: *Deleted

## 2013-03-14 VITALS — BP 132/96 | HR 85 | Temp 98.6°F | Ht 66.0 in | Wt 125.0 lb

## 2013-03-14 DIAGNOSIS — L237 Allergic contact dermatitis due to plants, except food: Secondary | ICD-10-CM

## 2013-03-14 DIAGNOSIS — L255 Unspecified contact dermatitis due to plants, except food: Secondary | ICD-10-CM

## 2013-03-14 DIAGNOSIS — F32A Depression, unspecified: Secondary | ICD-10-CM

## 2013-03-14 DIAGNOSIS — F411 Generalized anxiety disorder: Secondary | ICD-10-CM

## 2013-03-14 DIAGNOSIS — R45851 Suicidal ideations: Secondary | ICD-10-CM

## 2013-03-14 DIAGNOSIS — F329 Major depressive disorder, single episode, unspecified: Secondary | ICD-10-CM

## 2013-03-14 DIAGNOSIS — B20 Human immunodeficiency virus [HIV] disease: Secondary | ICD-10-CM

## 2013-03-14 DIAGNOSIS — Z23 Encounter for immunization: Secondary | ICD-10-CM

## 2013-03-14 LAB — CBC WITH DIFFERENTIAL/PLATELET
Eosinophils Absolute: 0.3 10*3/uL (ref 0.0–0.7)
Eosinophils Relative: 4 % (ref 0–5)
HCT: 44 % (ref 39.0–52.0)
Lymphs Abs: 3.1 10*3/uL (ref 0.7–4.0)
MCH: 31 pg (ref 26.0–34.0)
MCV: 89.6 fL (ref 78.0–100.0)
Monocytes Absolute: 0.6 10*3/uL (ref 0.1–1.0)
Platelets: 258 10*3/uL (ref 150–400)
RBC: 4.91 MIL/uL (ref 4.22–5.81)

## 2013-03-14 LAB — COMPLETE METABOLIC PANEL WITH GFR
ALT: 22 U/L (ref 0–53)
AST: 17 U/L (ref 0–37)
Albumin: 4 g/dL (ref 3.5–5.2)
Alkaline Phosphatase: 66 U/L (ref 39–117)
Calcium: 9.1 mg/dL (ref 8.4–10.5)
Chloride: 103 mEq/L (ref 96–112)
Creat: 0.77 mg/dL (ref 0.50–1.35)
Potassium: 4.5 mEq/L (ref 3.5–5.3)

## 2013-03-14 MED ORDER — EMTRICITAB-RILPIVIR-TENOFOV DF 200-25-300 MG PO TABS: 1.0000 | ORAL_TABLET | Freq: Every day | ORAL | Status: DC

## 2013-03-14 MED ORDER — TRIAMCINOLONE ACETONIDE 0.5 % EX OINT: TOPICAL_OINTMENT | Freq: Two times a day (BID) | CUTANEOUS | Status: DC

## 2013-03-14 MED ORDER — METHYLPREDNISOLONE SODIUM SUCC 125 MG IJ SOLR
125.0000 mg | Freq: Once | INTRAMUSCULAR | Status: AC
  Administered 2013-03-14: 125 mg via INTRAVENOUS

## 2013-03-14 MED ORDER — METHYLPREDNISOLONE SODIUM SUCC 125 MG IJ SOLR: 60.0000 mg | Freq: Once | INTRAMUSCULAR | Status: DC

## 2013-03-14 MED ORDER — METHYLPREDNISOLONE SODIUM SUCC 125 MG IJ SOLR: 125.0000 mg | Freq: Once | INTRAMUSCULAR | Status: DC

## 2013-03-14 NOTE — Progress Notes (Signed)
Patient ID: Marvin Macias, male   DOB: 1971-04-26, 42 y.o.   MRN: [id number] Met with Marvin Macias following his medical appt with Dr. Daiva Eves. We addressed his comments to Dr. Daiva Eves about sometimes thinking he would be better off not here. In evaluating Marvin Macias's comments and risk of harm to self, he explained that his comments were not literal or a true representation of a desire to harm himself. Marvin Macias stated that he gets "worked up" (angry) at times when expressing self but that he is actively trying to work through his present stresses. Counselor inquired as to patient's need for hospitalization as Dr. Daiva Eves had posed that for consideration if needed. Marvin Macias stated he does not want to go to the hospital and he outlined some planned activities in the upcoming days that are significant goals for him. We transitioned to what Marvin Macias has done since last meeting to connect with supports and he had pursued contact with several friends, family, and his mother. One of the friends called Marvin Macias during the session and relayed a job opportunity to him which he became positive about. We continued to process ways in which Marvin Macias can better cope with family stressors and properly express feelings of frustration when they come.   Patient affect was visibly improved since last week at initial session and he was able to articulate ways in which he has refocused on better self care. Pt has worked several days in the past week as well and reports remaining fairly active. He will contact this writer next week and intends to keep appt with Marvin Macias for 03/28/13 to focus on treatment of anxiety/depression. He indicated his HIV medication was switched today and he was looking forward to trying something with lower incidence for side effects.   In the past week, patient has maintained consistent contact with the clinic. He presents at times as very agitated with his sister, but able to express a clear desire to "get to higher ground" and to  learn improved coping such that family dynamics no longer elicit such an angry reaction. Pt has responded well to counseling support. He contracts for no harm to self (verbal agreement) and has resources available for help during crisis. It is recommended that he continue in therapy at [facility] given his positive response thus far.  Marvin Macias, [facility]

## 2013-03-14 NOTE — Patient Instructions (Addendum)
We will give you a shot of solumedrol today  YOur new HIV regimen is   Complera with 400 calorie meal   Do not take ANY proton pump inhibitors  Also ANY antacids such as tums, or pepcid IF needed should be taken 12 hours apart from complera

## 2013-03-14 NOTE — Progress Notes (Signed)
Subjective:    Patient ID: Marvin Macias, male    DOB: 11/09/1970, 42 y.o.   MRN: [id number]  Rash Pertinent negatives include no congestion, cough, diarrhea, fatigue, fever, rhinorrhea, shortness of breath, sore throat or vomiting.    42 year old Caucasian man with HIV that has previously been perfectly controlled on Atripla with undetectable viral loads and CD4 count. Patient presents to clinic today with severe rash after handling poison ivy. In the past he is required dosage with intramuscular Solu-Medrol to resolve this.   Additionally he is suffering from severe depressive symptoms with both passive and active suicidal ideation.  I would note that he has had actual prior attempted suicide by taking a bottle of benzodiazepines. I believe it was with a bottle of Ativan. Currently he is taking an antidepressant as well as clonazepam.  He states that his passive suicidal thoughts consisted him wishing that he would simply not wake up. The thoughts that are of active suicidal ideation, R1 that he does not feel that he would follow through with. He states that he will not let himself do this.  I told him is very concerned about his current emotional state in the admission to an inpatient psychiatric hospital should be very strongly considered. After further duration the patient preferred not to be admitted but to continue counseling with Max and to contract with [place] for safety.  Because of his severe suicidal ideation and depressive symptoms I felt very strongly should also come off of Atripla given the warnings with this drug and suicidal ideation. We'll change him instead to complera with food.  Review of Systems  Constitutional: Negative for fever, chills, diaphoresis, activity change, appetite change, fatigue and unexpected weight change.  HENT: Negative for congestion, sore throat, rhinorrhea, sneezing, trouble swallowing and sinus pressure.   Eyes: Negative for photophobia and visual  disturbance.  Respiratory: Negative for cough, chest tightness, shortness of breath, wheezing and stridor.   Cardiovascular: Negative for chest pain, palpitations and leg swelling.  Gastrointestinal: Negative for nausea, vomiting, abdominal pain, diarrhea, constipation, blood in stool, abdominal distention and anal bleeding.  Genitourinary: Negative for dysuria, hematuria, flank pain and difficulty urinating.  Musculoskeletal: Negative for myalgias, back pain, joint swelling, arthralgias and gait problem.  Skin: Positive for rash. Negative for color change, pallor and wound.  Neurological: Negative for dizziness, tremors, weakness and light-headedness.  Hematological: Negative for adenopathy. Does not bruise/bleed easily.  Psychiatric/Behavioral: Positive for suicidal ideas, sleep disturbance, dysphoric mood, decreased concentration and agitation. Negative for behavioral problems, confusion and self-injury.       Objective:   Physical Exam  Constitutional: He is oriented to person, place, and time.  HENT:  Head: Normocephalic and atraumatic.  Mouth/Throat: Oropharynx is clear and moist. No oropharyngeal exudate.  Eyes: Conjunctivae and EOM are normal. Pupils are equal, round, and reactive to light.  Neck: Normal range of motion. Neck supple.  Cardiovascular: Normal rate, regular rhythm and normal heart sounds.  Exam reveals no gallop and no friction rub.   No murmur heard. Pulmonary/Chest: Effort normal and breath sounds normal. No respiratory distress. He has no wheezes.  Abdominal: He exhibits no distension. There is no tenderness. There is no rebound.  Musculoskeletal: He exhibits no edema and no tenderness.  Neurological: He is alert and oriented to person, place, and time. He has normal reflexes. He exhibits normal muscle tone. Coordination normal.  Skin: Skin is warm. Rash noted. He is diaphoretic. No erythema. No pallor.     Psychiatric:  His mood appears anxious. His speech is  rapid and/or pressured. He is agitated. Thought content is not delusional. He exhibits a depressed mood. He expresses suicidal ideation. He expresses no homicidal ideation. He expresses no suicidal plans and no homicidal plans.          Assessment & Plan:   Depression with active and passive suicidal ideation:  Have offered an inpatient admission to psychiatry. He have to cover with prefer to continue as an outpatient with contract for safety. I'm definitely discontinue his Atripla. See  Discussion below. He continue his clonazepam he does not appear to be on a selective serotonin reuptake inhibitor and I think this needs to be more strongly considered. On car with Max if he really has been treated before with such agents.  I spent greater than 45 minutes with the patient including greater than 50% of time in face to face counsel of the patient and in coordination of their care.   HIV: He's been highly compliant with antiviral therapy will check a viral load and CD4 count today given that we're changing his therapy. I would change him to Complera to avoid the risks of SI from Atripla. Explained to him how to take this medicine with a meal and how to avoid proton pump inhibitors and antacids. I've given him a  Constructional booklet on how to take  The drug and is sent pharmacy a prescription for this.  Poison ivy: I've given him his dose of Solu-Medrol as well as a prescription for triamcinolone should this rebound of the bimanual is worse at the system.

## 2013-03-14 NOTE — Addendum Note (Signed)
Addended by: Starleen Arms D on: 03/14/2013 12:28 PM   Modules accepted: Orders

## 2013-03-15 LAB — HIV-1 RNA QUANT-NO REFLEX-BLD
HIV 1 RNA Quant: <20 copies/mL (ref <20)
HIV-1 RNA Quant, Log: <1.3 {Log} (ref <1.30)

## 2013-03-15 LAB — T-HELPER CELL (CD4) - (RCID CLINIC ONLY)
CD4 % Helper T Cell: 34 % (ref 33–55)
CD4 T Cell Abs: 1080 /uL (ref 400–2700)

## 2013-03-19 ENCOUNTER — Other Ambulatory Visit: Payer: Self-pay | Admitting: Licensed Clinical Social Worker

## 2013-03-19 DIAGNOSIS — F411 Generalized anxiety disorder: Secondary | ICD-10-CM

## 2013-03-19 MED ORDER — CLONAZEPAM 0.5 MG PO TABS: 0.5000 mg | ORAL_TABLET | Freq: Two times a day (BID) | ORAL | Status: DC | PRN

## 2013-03-25 ENCOUNTER — Other Ambulatory Visit: Payer: Self-pay | Admitting: *Deleted

## 2013-03-25 DIAGNOSIS — R197 Diarrhea, unspecified: Secondary | ICD-10-CM

## 2013-03-25 MED ORDER — DIPHENOXYLATE-ATROPINE 2.5-0.025 MG PO TABS: 1.0000 | ORAL_TABLET | Freq: Four times a day (QID) | ORAL | Status: DC | PRN

## 2013-03-28 ENCOUNTER — Ambulatory Visit: Payer: Self-pay

## 2013-03-28 DIAGNOSIS — F331 Major depressive disorder, recurrent, moderate: Secondary | ICD-10-CM

## 2013-03-28 DIAGNOSIS — F410 Panic disorder [episodic paroxysmal anxiety] without agoraphobia: Secondary | ICD-10-CM

## 2013-03-28 DIAGNOSIS — F411 Generalized anxiety disorder: Secondary | ICD-10-CM
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
## 2013-04-02 ENCOUNTER — Encounter: Payer: Self-pay | Admitting: *Deleted

## 2013-04-02 ENCOUNTER — Other Ambulatory Visit: Payer: Self-pay

## 2013-04-02 DIAGNOSIS — B20 Human immunodeficiency virus [HIV] disease: Secondary | ICD-10-CM

## 2013-04-02 DIAGNOSIS — Z760 Encounter for issue of repeat prescription: Secondary | ICD-10-CM

## 2013-04-02 LAB — COMPLETE METABOLIC PANEL WITH GFR
ALT: 27 U/L (ref 0–53)
AST: 19 U/L (ref 0–37)
Creat: 0.88 mg/dL (ref 0.50–1.35)
Total Bilirubin: 0.3 mg/dL (ref 0.3–1.2)

## 2013-04-02 LAB — CBC
MCH: 31.6 pg (ref 26.0–34.0)
MCV: 92.6 fL (ref 78.0–100.0)
Platelets: 270 10*3/uL (ref 150–400)
RDW: 14.1 % (ref 11.5–15.5)

## 2013-04-02 NOTE — Progress Notes (Signed)
Patient ID: Marvin Macias, male   DOB: September 15, 1970, 42 y.o.   MRN: [id number] Pt here to see counselor.  Requested refill for clonazepam.  MD please advise about refill, quantity and any additional refills.

## 2013-04-03 ENCOUNTER — Other Ambulatory Visit: Payer: Self-pay | Admitting: *Deleted

## 2013-04-03 DIAGNOSIS — F411 Generalized anxiety disorder: Secondary | ICD-10-CM

## 2013-04-03 LAB — T-HELPER CELL (CD4) - (RCID CLINIC ONLY)
CD4 % Helper T Cell: 35 % (ref 33–55)
CD4 T Cell Abs: 1050 /uL (ref 400–2700)

## 2013-04-03 MED ORDER — CLONAZEPAM 0.5 MG PO TABS: 0.5000 mg | ORAL_TABLET | Freq: Two times a day (BID) | ORAL | Status: DC | PRN

## 2013-04-03 NOTE — Progress Notes (Signed)
Ok to refill. 1 month supply, 3 refills

## 2013-04-03 NOTE — Progress Notes (Signed)
Patient ID: VIN YONKE, male   DOB: Sep 26, 1970, 42 y.o.   MRN: [id number] Order received from Dr. Ninetta Lights for refills.

## 2013-04-16 ENCOUNTER — Encounter: Payer: Self-pay | Admitting: Infectious Diseases

## 2013-04-16 ENCOUNTER — Ambulatory Visit (INDEPENDENT_AMBULATORY_CARE_PROVIDER_SITE_OTHER): Payer: Self-pay | Admitting: Infectious Diseases

## 2013-04-16 VITALS — BP 114/75 | HR 88 | Temp 98.7°F | Ht 66.0 in | Wt 132.0 lb

## 2013-04-16 DIAGNOSIS — C4491 Basal cell carcinoma of skin, unspecified: Secondary | ICD-10-CM

## 2013-04-16 DIAGNOSIS — Z113 Encounter for screening for infections with a predominantly sexual mode of transmission: Secondary | ICD-10-CM

## 2013-04-16 DIAGNOSIS — B079 Viral wart, unspecified: Secondary | ICD-10-CM

## 2013-04-16 DIAGNOSIS — Z79899 Other long term (current) drug therapy: Secondary | ICD-10-CM

## 2013-04-16 DIAGNOSIS — B20 Human immunodeficiency virus [HIV] disease: Secondary | ICD-10-CM

## 2013-04-16 NOTE — Assessment & Plan Note (Signed)
Needs to f/u at [facility]

## 2013-04-16 NOTE — Assessment & Plan Note (Signed)
Will send him to [facility] derm for bx, tx
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
## 2013-04-16 NOTE — Assessment & Plan Note (Signed)
Doing very well. States he got flu shot. Refuses condoms. Will see him back in 6 months.

## 2013-04-23 ENCOUNTER — Telehealth: Payer: Self-pay | Admitting: *Deleted

## 2013-04-23 NOTE — Telephone Encounter (Signed)
Patient referred to [facility] dermatology for suspected skin cancer, patient declined appointment d/t cost (would have to bring $88 to appointment and set up payment plan for the balance).  RN advised him on the importance of following up with a dermatologist, pt stated he would call the one his mother sees in [place].  While on the phone with [facility] dermatology, they noted that he had never been evaluated by their surgical group. 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  RN called that office to inquire about setting up a follow up appointment.  Patient hasn't been seen there since 2005 (Dr. Barrie Dunker), he would be treated as a new evaluation.  Pt would have to bring $226 to his first office visit and would have to set up a payment plan for the balance. Left patient a message asking him what he would like to do next. Andree Coss, RN

## 2013-05-09 ENCOUNTER — Ambulatory Visit: Payer: Self-pay

## 2013-05-16 ENCOUNTER — Ambulatory Visit: Payer: Self-pay

## 2013-05-16 DIAGNOSIS — F331 Major depressive disorder, recurrent, moderate: Secondary | ICD-10-CM

## 2013-05-16 DIAGNOSIS — F410 Panic disorder [episodic paroxysmal anxiety] without agoraphobia: Secondary | ICD-10-CM

## 2013-05-16 DIAGNOSIS — F411 Generalized anxiety disorder: Secondary | ICD-10-CM

## 2013-05-16 NOTE — Progress Notes (Signed)
Marvin Macias was pleasant and cooperative today, reporting that his anxiety and depression continue to bother him.  We wrote a treatment plan with goals of reducing anxiety, improving mood, and reducing panic attacks.  He talked some about his sister and how he plans to avoid her over the coming holidays.  I scheduled him an appointment in January, but encouraged him to call the clinic to see if I have any cancellations.

## 2013-06-21 ENCOUNTER — Other Ambulatory Visit: Payer: Self-pay | Admitting: Infectious Diseases

## 2013-06-21 DIAGNOSIS — B20 Human immunodeficiency virus [HIV] disease: Secondary | ICD-10-CM

## 2013-07-11 ENCOUNTER — Ambulatory Visit: Payer: Self-pay

## 2013-07-11 DIAGNOSIS — F4323 Adjustment disorder with mixed anxiety and depressed mood: Secondary | ICD-10-CM

## 2013-07-11 DIAGNOSIS — F411 Generalized anxiety disorder: Secondary | ICD-10-CM

## 2013-07-11 NOTE — Progress Notes (Signed)
Marvin Macias spent the first part of the session talking about his family and how they have never accepted him or supported him in any way.  He said he is currently unemployed and is struggling financially.  He then shifted to talking about moving away from [place].  He has a job possibility at [employer], so I suggested he pursue that job and then consider a transfer to where he wants to go.  He talked about [place], [place], where he visited once and loved, or [place] to meet again in 4 weeks.

## 2013-07-18 ENCOUNTER — Ambulatory Visit: Payer: Self-pay

## 2013-08-13 ENCOUNTER — Ambulatory Visit: Payer: Self-pay

## 2013-08-13 DIAGNOSIS — F411 Generalized anxiety disorder: Secondary | ICD-10-CM

## 2013-08-13 NOTE — Progress Notes (Signed)
Marvin Macias was in a good mood today, exhibiting some of his good sense of humor.  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  He has avoided any contact with his sister and, though he admits that he misses her, he doesn't miss her "bitching".  He said his mother is coming to town this weekend and he suspects she will want to get them together, which he doesn't care to do.  Otherwise, he seems to be stable at this time.  Plan to meet in 5 weeks or sooner if the schedule allows.

## 2013-08-15 ENCOUNTER — Other Ambulatory Visit: Payer: Self-pay | Admitting: *Deleted

## 2013-08-15 DIAGNOSIS — B20 Human immunodeficiency virus [HIV] disease: Secondary | ICD-10-CM

## 2013-08-15 MED ORDER — EMTRICITAB-RILPIVIR-TENOFOV DF 200-25-300 MG PO TABS: 1.0000 | ORAL_TABLET | Freq: Every day | ORAL | Status: DC

## 2013-08-20 ENCOUNTER — Other Ambulatory Visit: Payer: Self-pay | Admitting: Infectious Diseases

## 2013-08-24 ENCOUNTER — Other Ambulatory Visit: Payer: Self-pay | Admitting: Infectious Diseases

## 2013-08-26 ENCOUNTER — Other Ambulatory Visit: Payer: Self-pay | Admitting: *Deleted

## 2013-08-26 DIAGNOSIS — B20 Human immunodeficiency virus [HIV] disease: Secondary | ICD-10-CM

## 2013-08-26 MED ORDER — DIPHENOXYLATE-ATROPINE 2.5-0.025 MG PO TABS: ORAL_TABLET | ORAL | Status: DC

## 2013-09-09 ENCOUNTER — Ambulatory Visit (INDEPENDENT_AMBULATORY_CARE_PROVIDER_SITE_OTHER): Payer: Self-pay | Admitting: Infectious Disease

## 2013-09-09 ENCOUNTER — Encounter: Payer: Self-pay | Admitting: Infectious Disease

## 2013-09-09 VITALS — BP 118/81 | HR 77 | Temp 98.4°F | Wt 142.0 lb

## 2013-09-09 DIAGNOSIS — N4889 Other specified disorders of penis: Secondary | ICD-10-CM

## 2013-09-09 DIAGNOSIS — B20 Human immunodeficiency virus [HIV] disease: Secondary | ICD-10-CM

## 2013-09-09 DIAGNOSIS — Z113 Encounter for screening for infections with a predominantly sexual mode of transmission: Secondary | ICD-10-CM

## 2013-09-09 DIAGNOSIS — L0293 Carbuncle, unspecified: Secondary | ICD-10-CM

## 2013-09-09 DIAGNOSIS — L0292 Furuncle, unspecified: Secondary | ICD-10-CM | POA: Insufficient documentation

## 2013-09-09 DIAGNOSIS — N489 Disorder of penis, unspecified: Secondary | ICD-10-CM | POA: Insufficient documentation

## 2013-09-09 LAB — COMPLETE METABOLIC PANEL WITH GFR
ALT: 36 U/L (ref 0–53)
AST: 37 U/L (ref 0–37)
Albumin: 3.8 g/dL (ref 3.5–5.2)
Alkaline Phosphatase: 58 U/L (ref 39–117)
BUN: 12 mg/dL (ref 6–23)
CALCIUM: 9.5 mg/dL (ref 8.4–10.5)
CO2: 31 mEq/L (ref 19–32)
Chloride: 103 mEq/L (ref 96–112)
Creat: 0.85 mg/dL (ref 0.50–1.35)
GFR, Est African American: >89 mL/min
GFR, Est Non African American: >89 mL/min
Glucose, Bld: 99 mg/dL (ref 70–99)
Potassium: 4.4 mEq/L (ref 3.5–5.3)
Sodium: 139 mEq/L (ref 135–145)
TOTAL PROTEIN: 6.1 g/dL (ref 6.0–8.3)
Total Bilirubin: 0.4 mg/dL (ref 0.2–1.2)

## 2013-09-09 LAB — CBC WITH DIFFERENTIAL/PLATELET
BASOS ABS: 0 10*3/uL (ref 0.0–0.1)
Basophils Relative: 0 % (ref 0–1)
EOS ABS: 0.1 10*3/uL (ref 0.0–0.7)
EOS PCT: 1 % (ref 0–5)
HCT: 43.4 % (ref 39.0–52.0)
Hemoglobin: 14.6 g/dL (ref 13.0–17.0)
Lymphocytes Relative: 26 % (ref 12–46)
Lymphs Abs: 2.6 10*3/uL (ref 0.7–4.0)
MCH: 31.5 pg (ref 26.0–34.0)
MCHC: 33.6 g/dL (ref 30.0–36.0)
MCV: 93.7 fL (ref 78.0–100.0)
MONO ABS: 0.5 10*3/uL (ref 0.1–1.0)
Monocytes Relative: 5 % (ref 3–12)
Neutro Abs: 6.8 10*3/uL (ref 1.7–7.7)
Neutrophils Relative %: 68 % (ref 43–77)
Platelets: 217 10*3/uL (ref 150–400)
RBC: 4.63 MIL/uL (ref 4.22–5.81)
RDW: 12.8 % (ref 11.5–15.5)
WBC: 10 10*3/uL (ref 4.0–10.5)

## 2013-09-09 MED ORDER — DOXYCYCLINE HYCLATE 100 MG PO TABS: 100.0000 mg | ORAL_TABLET | Freq: Two times a day (BID) | ORAL | Status: DC

## 2013-09-09 NOTE — Patient Instructions (Signed)
Take doxycyline twice daily for 2 weeks   We will get labs today  Keep your appt with Dr. Johnnye Marvin Macias to review your labs drawn today in April and to followup on this lesion

## 2013-09-09 NOTE — Progress Notes (Signed)
   Subjective:    Patient ID: Marvin Macias, male    DOB: [birth year], 43 y.o.   MRN: [id number]  HPI  Marvin Macias is a 43 y.o. male with HIV infection who is doing superbly well on his antiviral regimen, complera with undetectable viral load and health cd4 count who comes in today with new complaint of lesion on shaft of his penis where he has been shaving.  He states that he developed this lesion this past summer with shaving but now it has recurred, after never completely going away.   He states that he was able to express white material from it.   He has been applying warm compresses.   Review of Systems  Constitutional: Negative for fever, chills, diaphoresis, activity change, appetite change, fatigue and unexpected weight change.  HENT: Negative for congestion, rhinorrhea, sinus pressure, sneezing, sore throat and trouble swallowing.   Eyes: Negative for photophobia and visual disturbance.  Respiratory: Negative for cough, chest tightness, shortness of breath, wheezing and stridor.   Cardiovascular: Negative for chest pain, palpitations and leg swelling.  Gastrointestinal: Negative for nausea, vomiting, abdominal pain, diarrhea, constipation, blood in stool, abdominal distention and anal bleeding.  Genitourinary: Positive for penile pain. Negative for dysuria, hematuria, flank pain and difficulty urinating.  Musculoskeletal: Negative for arthralgias, back pain, gait problem, joint swelling and myalgias.  Skin: Positive for rash. Negative for color change, pallor and wound.  Neurological: Negative for dizziness, tremors, weakness and light-headedness.  Hematological: Negative for adenopathy. Does not bruise/bleed easily.  Psychiatric/Behavioral: Negative for behavioral problems, confusion, sleep disturbance, dysphoric mood, decreased concentration and agitation.       Objective:   Physical Exam  Constitutional: He is oriented to person, place, and time. He appears  well-developed and well-nourished. No distress.  HENT:  Head: Normocephalic and atraumatic.  Eyes: Conjunctivae and EOM are normal.  Neck: Normal range of motion. Neck supple.  Cardiovascular: Normal rate and regular rhythm.   Pulmonary/Chest: Effort normal. No respiratory distress. He has no wheezes.  Abdominal: He exhibits no distension. There is no tenderness.  Genitourinary:     Musculoskeletal: He exhibits no edema.  Neurological: He is alert and oriented to person, place, and time. Coordination normal.  Skin: Skin is warm and dry. He is not diaphoretic. There is erythema. No pallor.  Psychiatric: He has a normal mood and affect. His behavior is normal. Judgment and thought content normal.          Assessment & Plan:   Furuncle on shaft of penis: could be genital wart as well tha tis superinfected, given persistence  --give doxy for 2 weeks and re-assess  STD screening; check gc, chlamydia and RPR  HIV: go ahead and check labs today and can keep FU appt with Dr. Johnnye Sima in April

## 2013-09-10 LAB — T-HELPER CELL (CD4) - (RCID CLINIC ONLY)
CD4 % Helper T Cell: 35 % (ref 33–55)
CD4 T Cell Abs: 950 /uL (ref 400–2700)

## 2013-09-10 LAB — HIV-1 RNA QUANT-NO REFLEX-BLD: HIV 1 RNA Quant: <20 copies/mL (ref <20)

## 2013-09-10 LAB — RPR

## 2013-09-13 LAB — HLA B*5701: HLA-B*5701 w/rflx HLA-B High: NEGATIVE

## 2013-09-19 ENCOUNTER — Other Ambulatory Visit: Payer: Self-pay | Admitting: *Deleted

## 2013-09-19 DIAGNOSIS — B20 Human immunodeficiency virus [HIV] disease: Secondary | ICD-10-CM

## 2013-09-19 MED ORDER — EMTRICITAB-RILPIVIR-TENOFOV DF 200-25-300 MG PO TABS: 1.0000 | ORAL_TABLET | Freq: Every day | ORAL | Status: DC

## 2013-09-19 NOTE — Addendum Note (Signed)
Addended by: Lorne Skeens D on: 09/19/2013 02:10 PM   Modules accepted: Orders

## 2013-09-23 ENCOUNTER — Ambulatory Visit: Payer: Self-pay

## 2013-10-08 ENCOUNTER — Ambulatory Visit: Payer: Self-pay

## 2013-10-15 ENCOUNTER — Other Ambulatory Visit: Payer: Self-pay

## 2013-10-21 ENCOUNTER — Other Ambulatory Visit: Payer: Self-pay | Admitting: Infectious Diseases

## 2013-10-29 ENCOUNTER — Encounter: Payer: Self-pay | Admitting: Infectious Diseases
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
## 2013-10-30 ENCOUNTER — Telehealth: Payer: Self-pay | Admitting: *Deleted

## 2013-10-30 NOTE — Telephone Encounter (Signed)
Asking Dr. Johnnye Sima to call him with lab numbers, especially if his rxes need changed.

## 2013-11-19 ENCOUNTER — Other Ambulatory Visit: Payer: Self-pay | Admitting: Infectious Diseases

## 2013-11-19 ENCOUNTER — Telehealth: Payer: Self-pay | Admitting: *Deleted

## 2013-11-19 DIAGNOSIS — R197 Diarrhea, unspecified: Secondary | ICD-10-CM

## 2013-11-19 MED ORDER — DIPHENOXYLATE-ATROPINE 2.5-0.025 MG PO TABS: ORAL_TABLET | ORAL | Status: DC

## 2013-11-19 NOTE — Addendum Note (Signed)
Addended by: Lorne Skeens D on: 11/19/2013 02:14 PM   Modules accepted: Orders

## 2013-11-19 NOTE — Telephone Encounter (Signed)
120 is ok  thanks

## 2013-11-19 NOTE — Telephone Encounter (Signed)
Dr. Johnnye Sima please advise about number of Lomotil tablets.

## 2013-12-02 ENCOUNTER — Telehealth: Payer: Self-pay | Admitting: Infectious Diseases

## 2013-12-02 NOTE — Telephone Encounter (Signed)
Called pt to let him know his labs were fine. Will see him next week.

## 2013-12-09 ENCOUNTER — Encounter: Payer: Self-pay | Admitting: Infectious Diseases

## 2013-12-09 ENCOUNTER — Ambulatory Visit (INDEPENDENT_AMBULATORY_CARE_PROVIDER_SITE_OTHER): Payer: Self-pay | Admitting: Infectious Diseases

## 2013-12-09 ENCOUNTER — Telehealth: Payer: Self-pay | Admitting: *Deleted

## 2013-12-09 ENCOUNTER — Other Ambulatory Visit: Payer: Self-pay | Admitting: *Deleted

## 2013-12-09 VITALS — BP 135/89 | HR 72 | Temp 98.5°F | Wt 140.0 lb

## 2013-12-09 DIAGNOSIS — F411 Generalized anxiety disorder: Secondary | ICD-10-CM

## 2013-12-09 DIAGNOSIS — F17200 Nicotine dependence, unspecified, uncomplicated: Secondary | ICD-10-CM

## 2013-12-09 DIAGNOSIS — N489 Disorder of penis, unspecified: Secondary | ICD-10-CM

## 2013-12-09 DIAGNOSIS — B20 Human immunodeficiency virus [HIV] disease: Secondary | ICD-10-CM

## 2013-12-09 DIAGNOSIS — B079 Viral wart, unspecified: Secondary | ICD-10-CM

## 2013-12-09 DIAGNOSIS — N4889 Other specified disorders of penis: Secondary | ICD-10-CM

## 2013-12-09 DIAGNOSIS — J309 Allergic rhinitis, unspecified: Secondary | ICD-10-CM | POA: Insufficient documentation

## 2013-12-09 MED ORDER — DRONABINOL 5 MG PO CAPS: 5.0000 mg | ORAL_CAPSULE | Freq: Two times a day (BID) | ORAL | Status: DC

## 2013-12-09 NOTE — Assessment & Plan Note (Signed)
Appears to be fairly well controlled.

## 2013-12-09 NOTE — Assessment & Plan Note (Signed)
Needs f/u at [facility]

## 2013-12-09 NOTE — Assessment & Plan Note (Signed)
Has resolved.

## 2013-12-09 NOTE — Assessment & Plan Note (Signed)
He is doing very well on new ART. vax are uptodate. Offered/refused condoms. Will see him back in 6 months.

## 2013-12-09 NOTE — Telephone Encounter (Signed)
Patient notified of appointment with [facility] with their resident clinic for 12/23/13 at 11:00 AM. Office note faxed to [phone]. Patient will receive new patient packet. Marvin Macias
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
## 2013-12-09 NOTE — Assessment & Plan Note (Signed)
Advised him to try flonase for 1 week, claritin/zyrtec/allegra (one of these) for next 2 weeks. Asked that he call me back if his cough, voice change does not improve.

## 2013-12-09 NOTE — Assessment & Plan Note (Signed)
Encouraged to quit. 

## 2013-12-18 ENCOUNTER — Other Ambulatory Visit: Payer: Self-pay | Admitting: Infectious Diseases

## 2013-12-25 ENCOUNTER — Other Ambulatory Visit: Payer: Self-pay | Admitting: *Deleted

## 2014-01-27 ENCOUNTER — Telehealth: Payer: Self-pay | Admitting: *Deleted
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
## 2014-01-28 NOTE — Telephone Encounter (Signed)
Patient called back requesting an antibiotic. Offered appt with Dr. Tommy Medal for tomorrow at 1:45 pm. Patient accepted appointment.

## 2014-01-29 ENCOUNTER — Ambulatory Visit (INDEPENDENT_AMBULATORY_CARE_PROVIDER_SITE_OTHER): Payer: Self-pay | Admitting: Infectious Disease

## 2014-01-29 ENCOUNTER — Encounter: Payer: Self-pay | Admitting: Infectious Disease

## 2014-01-29 VITALS — BP 120/81 | HR 80 | Temp 99.2°F | Wt 136.0 lb

## 2014-01-29 DIAGNOSIS — H65199 Other acute nonsuppurative otitis media, unspecified ear: Secondary | ICD-10-CM

## 2014-01-29 DIAGNOSIS — H65192 Other acute nonsuppurative otitis media, left ear: Secondary | ICD-10-CM

## 2014-01-29 DIAGNOSIS — B20 Human immunodeficiency virus [HIV] disease: Secondary | ICD-10-CM

## 2014-01-29 MED ORDER — LEVOFLOXACIN 500 MG PO TABS: 500.0000 mg | ORAL_TABLET | Freq: Every day | ORAL | Status: DC

## 2014-01-29 NOTE — Progress Notes (Signed)
   Subjective:    Patient ID: Marvin Macias, male    DOB: 30-Jan-1971, 43 y.o.   MRN: [id number]  HPI   Marvin Macias is a 43 y.o. male with HIV infection who is doing superbly well on his antiviral regimen, complera with undetectable viral load and health cd4   Lab Results  Component Value Date   HIV1RNAQUANT <20 09/09/2013   Lab Results  Component Value Date   CD4TABS 950 09/09/2013   CD4TABS 1050 04/02/2013   CD4TABS 1080 03/14/2013     He actually tells me that he dislikes taking the complera with food but he is willing to continue for now and I suggested potential switch to [facility] which does not require food.  He comes in with acute complaint of URI ssx with sore throat, cough and ear pain and fullness, low grade fever  Review of Systems  Constitutional: Positive for chills, diaphoresis and fatigue. Negative for fever, activity change, appetite change and unexpected weight change.  HENT: Positive for congestion, ear pain, hearing loss, rhinorrhea, sinus pressure, sneezing and sore throat. Negative for trouble swallowing.   Eyes: Negative for photophobia and visual disturbance.  Respiratory: Positive for cough. Negative for chest tightness, shortness of breath, wheezing and stridor.   Cardiovascular: Negative for chest pain, palpitations and leg swelling.  Gastrointestinal: Negative for nausea, vomiting, abdominal pain, diarrhea, constipation, blood in stool, abdominal distention and anal bleeding.  Genitourinary: Positive for penile pain. Negative for dysuria, hematuria, flank pain and difficulty urinating.  Musculoskeletal: Negative for arthralgias, back pain, gait problem, joint swelling and myalgias.  Skin: Positive for rash. Negative for color change, pallor and wound.  Neurological: Negative for dizziness, tremors, weakness and light-headedness.  Hematological: Negative for adenopathy. Does not bruise/bleed easily.  Psychiatric/Behavioral: Negative for behavioral problems,  confusion, sleep disturbance, dysphoric mood, decreased concentration and agitation.       Objective:   Physical Exam  Nursing note and vitals reviewed. Constitutional: He is oriented to person, place, and time. He appears well-developed and well-nourished. No distress.  HENT:  Head: Normocephalic and atraumatic.  Right Ear: Tympanic membrane is scarred.  Left Ear: There is swelling. A middle ear effusion is present. Decreased hearing is noted.  Eyes: Conjunctivae and EOM are normal.  Neck: Normal range of motion. Neck supple.  Cardiovascular: Normal rate and regular rhythm.   Pulmonary/Chest: Effort normal. No respiratory distress. He has no wheezes.  Abdominal: He exhibits no distension. There is no tenderness.  Musculoskeletal: He exhibits no edema.  Neurological: He is alert and oriented to person, place, and time. Coordination normal.  Skin: Skin is warm and dry. He is not diaphoretic. There is erythema. No pallor.  Psychiatric: He has a normal mood and affect. His behavior is normal. Judgment and thought content normal.          Assessment & Plan:   HIV: continue complera and consider change to [facility] in future. I spent greater than 25 minutes with the patient including greater than 50% of time in face to face counsel of the patient and in coordination of their care.   URI , possible, bacterial superinfection: --give 10 days of levaquin, afrin inh and oral pseudophed

## 2014-01-29 NOTE — Patient Instructions (Signed)
I have written rx for Levaquin x 10 days for you in case this is already a bacterial infection on top of the virus you contracted  Please take also AFrin nose spray at night to open up your sinuses along with your flonase  Please take pseudophed otc to help with congestion  [facility]  Keep you followup appt with Dr. Johnnye Sima

## 2014-02-01 ENCOUNTER — Telehealth: Payer: Self-pay | Admitting: Internal Medicine

## 2014-02-01 NOTE — Telephone Encounter (Signed)
Mr. Marvin Macias called this morning to inform me that a recent sexual partner told him that he had some type of venereal disease. He was treated with an antibiotic injection. Mr. Marvin Macias says that he now has a burning sensation at the tip of his penis. He is recently been on levofloxacin for a sinus infection. He is also taking his Complera. I told him that he would need to be evaluated before deciding what to do any treatments he would need. I told him that his options over the weekend would be to go to the [facility] urgent care Center or wait until Monday to see if he can be seen in our clinic next week or at the health department.

## 2014-02-03 ENCOUNTER — Telehealth: Payer: Self-pay | Admitting: *Deleted

## 2014-02-03 ENCOUNTER — Ambulatory Visit (INDEPENDENT_AMBULATORY_CARE_PROVIDER_SITE_OTHER): Payer: Self-pay | Admitting: Internal Medicine

## 2014-02-03 ENCOUNTER — Encounter: Payer: Self-pay | Admitting: Internal Medicine

## 2014-02-03 VITALS — BP 146/87 | HR 103 | Temp 97.7°F | Wt 134.0 lb

## 2014-02-03 DIAGNOSIS — Z113 Encounter for screening for infections with a predominantly sexual mode of transmission: Secondary | ICD-10-CM

## 2014-02-03 DIAGNOSIS — R82998 Other abnormal findings in urine: Secondary | ICD-10-CM

## 2014-02-03 DIAGNOSIS — R8281 Pyuria: Secondary | ICD-10-CM | POA: Insufficient documentation

## 2014-02-03 MED ORDER — CEFTRIAXONE SODIUM 250 MG IJ SOLR: 250.0000 mg | Freq: Once | INTRAMUSCULAR | Status: DC

## 2014-02-03 MED ORDER — CEFTRIAXONE SODIUM 1 G IJ SOLR
250.0000 mg | Freq: Once | INTRAMUSCULAR | Status: AC
  Administered 2014-02-03: 250 mg via INTRAMUSCULAR

## 2014-02-03 NOTE — Telephone Encounter (Signed)
Tip of penis is burning/ Dr Megan Salon recommended he call for an appt.  Appt given for this afternoon.

## 2014-02-03 NOTE — Addendum Note (Signed)
Addended by: Myrtis Hopping A on: 02/03/2014 03:35 PM   Modules accepted: Orders, Medications

## 2014-02-03 NOTE — Progress Notes (Signed)
   Subjective:    Patient ID: Marvin Macias, male    DOB: 10/07/1970, 43 y.o.   MRN: [id number]  HPI Here for a work in visit.  Recent unprotected sexual encounter and told he was exposed to some type of venereal disease.  Burining with urination.  No discharge, no lesions. Partner says he got a "shot" and it cleared up.     Review of Systems  Constitutional: Negative for fever.  Genitourinary: Positive for dysuria. Negative for frequency, discharge, genital sores and testicular pain.       Objective:   Physical Exam        Assessment & Plan:

## 2014-02-03 NOTE — Assessment & Plan Note (Addendum)
Will check UA, urine cyto, RPR.  Will give empiric ceftriaxone 250 mg IM in anticipation of GC and will call him once results available.   20 minutes spent with 10 minutes face to face counseling

## 2014-02-04 ENCOUNTER — Telehealth: Payer: Self-pay | Admitting: *Deleted

## 2014-02-04 LAB — URINALYSIS, ROUTINE W REFLEX MICROSCOPIC
Bilirubin Urine: NEGATIVE
Glucose, UA: NEGATIVE mg/dL
Hgb urine dipstick: NEGATIVE
LEUKOCYTES UA: NEGATIVE
NITRITE: NEGATIVE
PROTEIN: NEGATIVE mg/dL
Specific Gravity, Urine: 1.028 (ref 1.005–1.030)
Urobilinogen, UA: 0.2 mg/dL (ref 0.0–1.0)
pH: 5 (ref 5.0–8.0)

## 2014-02-04 NOTE — Telephone Encounter (Signed)
Patient called to get his results but they were not in on his RPR. He will call back later or tomorrow to get results as he is very anxious.

## 2014-02-05 ENCOUNTER — Telehealth: Payer: Self-pay | Admitting: *Deleted

## 2014-02-05 LAB — SYPHILIS: RPR W/REFLEX TO RPR TITER AND TREPONEMAL ANTIBODIES, TRADITIONAL SCREENING AND DIAGNOSIS ALGORITHM

## 2014-02-05 NOTE — Telephone Encounter (Signed)
Called the patient as the lab was able to result his RPR and it was negative. Canceled his lab appt set for 02/06/14. The patient was very happy and he is not having any symptoms at this time.

## 2014-02-05 NOTE — Telephone Encounter (Signed)
Message copied by Reggy Eye on Wed Feb 05, 2014  5:02 PM ------      Message from: Thayer Headings      Created: Wed Feb 05, 2014  4:43 PM       Please let him know that all his tests are negative (gonorrhea, syphilis, chlamydia) and no UTI.  thanks ------

## 2014-02-05 NOTE — Telephone Encounter (Signed)
Done already as he has called several times anxious for results.

## 2014-02-05 NOTE — Telephone Encounter (Signed)
Called the patient back after checking with the lab to advise him that he will need to have his RPR redrawn as there was a problem with the sample at the lab. Had to leave a message for him to call the office asap

## 2014-02-06 ENCOUNTER — Other Ambulatory Visit: Payer: Self-pay

## 2014-02-13 ENCOUNTER — Ambulatory Visit: Payer: Self-pay

## 2014-02-27 ENCOUNTER — Other Ambulatory Visit: Payer: Self-pay | Admitting: Infectious Diseases

## 2014-02-27 ENCOUNTER — Telehealth: Payer: Self-pay | Admitting: *Deleted

## 2014-02-27 DIAGNOSIS — F411 Generalized anxiety disorder: Secondary | ICD-10-CM

## 2014-02-27 NOTE — Telephone Encounter (Signed)
Patient requesting refill of klonopin. Please advise. [name], RN

## 2014-03-03 ENCOUNTER — Other Ambulatory Visit: Payer: Self-pay | Admitting: *Deleted

## 2014-03-03 NOTE — Telephone Encounter (Signed)
ok 

## 2014-03-05 ENCOUNTER — Other Ambulatory Visit: Payer: Self-pay | Admitting: *Deleted

## 2014-03-05 DIAGNOSIS — B20 Human immunodeficiency virus [HIV] disease: Secondary | ICD-10-CM

## 2014-03-05 MED ORDER — EMTRICITAB-RILPIVIR-TENOFOV DF 200-25-300 MG PO TABS: 1.0000 | ORAL_TABLET | Freq: Every day | ORAL | Status: DC

## 2014-03-05 NOTE — Telephone Encounter (Signed)
ADAP Application 

## 2014-03-26 ENCOUNTER — Other Ambulatory Visit (INDEPENDENT_AMBULATORY_CARE_PROVIDER_SITE_OTHER): Payer: Self-pay

## 2014-03-26 DIAGNOSIS — Z113 Encounter for screening for infections with a predominantly sexual mode of transmission: Secondary | ICD-10-CM

## 2014-03-26 DIAGNOSIS — Z79899 Other long term (current) drug therapy: Secondary | ICD-10-CM

## 2014-03-26 DIAGNOSIS — B20 Human immunodeficiency virus [HIV] disease: Secondary | ICD-10-CM

## 2014-03-26 LAB — CBC WITH DIFFERENTIAL/PLATELET
Basophils Absolute: 0 10*3/uL (ref 0.0–0.1)
Basophils Relative: 0 % (ref 0–1)
EOS ABS: 0.1 10*3/uL (ref 0.0–0.7)
Eosinophils Relative: 1 % (ref 0–5)
HEMATOCRIT: 49.1 % (ref 39.0–52.0)
HEMOGLOBIN: 16.7 g/dL (ref 13.0–17.0)
Lymphocytes Relative: 30 % (ref 12–46)
Lymphs Abs: 2.6 10*3/uL (ref 0.7–4.0)
MCH: 31.9 pg (ref 26.0–34.0)
MCHC: 34 g/dL (ref 30.0–36.0)
MCV: 93.7 fL (ref 78.0–100.0)
MONO ABS: 0.5 10*3/uL (ref 0.1–1.0)
MONOS PCT: 6 % (ref 3–12)
Neutro Abs: 5.5 10*3/uL (ref 1.7–7.7)
Neutrophils Relative %: 63 % (ref 43–77)
Platelets: 242 10*3/uL (ref 150–400)
RBC: 5.24 MIL/uL (ref 4.22–5.81)
RDW: 13.5 % (ref 11.5–15.5)
WBC: 8.8 10*3/uL (ref 4.0–10.5)

## 2014-03-26 LAB — LIPID PANEL
Cholesterol: 162 mg/dL (ref 0–200)
HDL: 31 mg/dL — AB (ref >39)
LDL CALC: 117 mg/dL — AB (ref 0–99)
Total CHOL/HDL Ratio: 5.2 Ratio
Triglycerides: 70 mg/dL (ref <150)
VLDL: 14 mg/dL (ref 0–40)

## 2014-03-26 LAB — COMPLETE METABOLIC PANEL WITH GFR
ALBUMIN: 4.2 g/dL (ref 3.5–5.2)
ALT: 17 U/L (ref 0–53)
AST: 18 U/L (ref 0–37)
Alkaline Phosphatase: 60 U/L (ref 39–117)
BUN: 13 mg/dL (ref 6–23)
CO2: 27 mEq/L (ref 19–32)
Calcium: 9.5 mg/dL (ref 8.4–10.5)
Chloride: 101 mEq/L (ref 96–112)
Creat: 0.92 mg/dL (ref 0.50–1.35)
GFR, Est African American: >89 mL/min
GLUCOSE: 86 mg/dL (ref 70–99)
POTASSIUM: 4.6 meq/L (ref 3.5–5.3)
Sodium: 139 mEq/L (ref 135–145)
Total Bilirubin: 0.3 mg/dL (ref 0.2–1.2)
Total Protein: 6.8 g/dL (ref 6.0–8.3)

## 2014-03-26 LAB — RPR

## 2014-03-27 ENCOUNTER — Telehealth: Payer: Self-pay | Admitting: *Deleted

## 2014-03-27 LAB — T-HELPER CELL (CD4) - (RCID CLINIC ONLY)
CD4 T CELL HELPER: 36 % (ref 33–55)
CD4 T Cell Abs: 960 /uL (ref 400–2700)

## 2014-03-27 LAB — HIV-1 RNA QUANT-NO REFLEX-BLD: HIV 1 RNA Quant: <20 copies/mL (ref <20)

## 2014-03-28 NOTE — Telephone Encounter (Signed)
Patient called asking for his RPR results.  Patient had a known exposure 9/19.  RN explained that while this most recent test was negative, he is still in the incubation period.  Patient wants to know if he should go ahead and receive treatment.  RN advised that we would need an order from Dr. Johnnye Sima regarding treatment, especially since he is allergic to penicillin.  Patient agreed, but is very anxious. Through discussion, patient holds many misconceptions about STD transmission.  He recently has had the same conversation with other multiple other nurses in this office.  RN advised the patient that he needed to have a frank discussion regarding this with Dr. Johnnye Sima at his next appointment. [name], RN

## 2014-04-02 ENCOUNTER — Other Ambulatory Visit: Payer: Self-pay | Admitting: Infectious Diseases

## 2014-04-02 ENCOUNTER — Other Ambulatory Visit: Payer: Self-pay | Admitting: Licensed Clinical Social Worker

## 2014-04-02 DIAGNOSIS — Z202 Contact with and (suspected) exposure to infections with a predominantly sexual mode of transmission: Secondary | ICD-10-CM

## 2014-04-02 MED ORDER — DOXYCYCLINE HYCLATE 100 MG PO TABS: 100.0000 mg | ORAL_TABLET | Freq: Two times a day (BID) | ORAL | Status: DC

## 2014-04-02 NOTE — Telephone Encounter (Signed)
[facility] notifying patient that the rx was written.

## 2014-04-02 NOTE — Telephone Encounter (Signed)
rx written for doxy 100mg  bid for 2 weeks.

## 2014-05-26 ENCOUNTER — Other Ambulatory Visit: Payer: Self-pay

## 2014-05-27 ENCOUNTER — Other Ambulatory Visit: Payer: Self-pay | Admitting: *Deleted

## 2014-05-27 MED ORDER — DIPHENOXYLATE-ATROPINE 2.5-0.025 MG PO TABS: 1.0000 | ORAL_TABLET | Freq: Four times a day (QID) | ORAL | Status: DC | PRN

## 2014-06-03 ENCOUNTER — Other Ambulatory Visit (INDEPENDENT_AMBULATORY_CARE_PROVIDER_SITE_OTHER): Payer: Self-pay

## 2014-06-03 DIAGNOSIS — B20 Human immunodeficiency virus [HIV] disease: Secondary | ICD-10-CM

## 2014-06-03 LAB — CBC WITH DIFFERENTIAL/PLATELET
Basophils Absolute: 0.1 10*3/uL (ref 0.0–0.1)
Basophils Relative: 1 % (ref 0–1)
Eosinophils Absolute: 0.1 10*3/uL (ref 0.0–0.7)
Eosinophils Relative: 1 % (ref 0–5)
HEMATOCRIT: 46.7 % (ref 39.0–52.0)
Hemoglobin: 16.6 g/dL (ref 13.0–17.0)
LYMPHS PCT: 35 % (ref 12–46)
Lymphs Abs: 3.3 10*3/uL (ref 0.7–4.0)
MCH: 31.7 pg (ref 26.0–34.0)
MCHC: 35.5 g/dL (ref 30.0–36.0)
MCV: 89.1 fL (ref 78.0–100.0)
MONOS PCT: 7 % (ref 3–12)
MPV: 11 fL (ref 9.4–12.4)
Monocytes Absolute: 0.7 10*3/uL (ref 0.1–1.0)
NEUTROS ABS: 5.2 10*3/uL (ref 1.7–7.7)
Neutrophils Relative %: 56 % (ref 43–77)
Platelets: 211 10*3/uL (ref 150–400)
RBC: 5.24 MIL/uL (ref 4.22–5.81)
RDW: 13.9 % (ref 11.5–15.5)
WBC: 9.3 10*3/uL (ref 4.0–10.5)

## 2014-06-04 LAB — COMPLETE METABOLIC PANEL WITH GFR
ALT: 32 U/L (ref 0–53)
AST: 33 U/L (ref 0–37)
Albumin: 4 g/dL (ref 3.5–5.2)
Alkaline Phosphatase: 59 U/L (ref 39–117)
BUN: 13 mg/dL (ref 6–23)
CALCIUM: 9.1 mg/dL (ref 8.4–10.5)
CHLORIDE: 103 meq/L (ref 96–112)
CO2: 26 mEq/L (ref 19–32)
Creat: 0.88 mg/dL (ref 0.50–1.35)
GFR, Est African American: >89 mL/min
GFR, Est Non African American: >89 mL/min
GLUCOSE: 94 mg/dL (ref 70–99)
POTASSIUM: 4.8 meq/L (ref 3.5–5.3)
Sodium: 138 mEq/L (ref 135–145)
Total Bilirubin: 0.5 mg/dL (ref 0.2–1.2)
Total Protein: 6.3 g/dL (ref 6.0–8.3)

## 2014-06-05 LAB — T-HELPER CELLS (CD4) COUNT (NOT AT ARMC)
CD4 % Helper T Cell: 36 % (ref 33–55)
CD4 T Cell Abs: 1130 /uL (ref 400–2700)

## 2014-06-05 LAB — HIV-1 RNA QUANT-NO REFLEX-BLD
HIV 1 RNA Quant: <20 copies/mL (ref <20)
HIV-1 RNA Quant, Log: <1.3 {Log} (ref <1.30)

## 2014-06-11 ENCOUNTER — Ambulatory Visit: Payer: Self-pay | Admitting: Infectious Diseases

## 2014-06-19 ENCOUNTER — Encounter: Payer: Self-pay | Admitting: Infectious Diseases

## 2014-06-19 ENCOUNTER — Ambulatory Visit (INDEPENDENT_AMBULATORY_CARE_PROVIDER_SITE_OTHER): Payer: Self-pay | Admitting: Infectious Diseases

## 2014-06-19 ENCOUNTER — Ambulatory Visit (INDEPENDENT_AMBULATORY_CARE_PROVIDER_SITE_OTHER): Payer: Self-pay | Admitting: *Deleted

## 2014-06-19 VITALS — BP 122/88 | HR 89 | Temp 98.1°F | Wt 137.0 lb

## 2014-06-19 DIAGNOSIS — F17200 Nicotine dependence, unspecified, uncomplicated: Secondary | ICD-10-CM

## 2014-06-19 DIAGNOSIS — Z79899 Other long term (current) drug therapy: Secondary | ICD-10-CM

## 2014-06-19 DIAGNOSIS — Z720 Tobacco use: Secondary | ICD-10-CM

## 2014-06-19 DIAGNOSIS — Z113 Encounter for screening for infections with a predominantly sexual mode of transmission: Secondary | ICD-10-CM

## 2014-06-19 DIAGNOSIS — Z23 Encounter for immunization: Secondary | ICD-10-CM

## 2014-06-19 DIAGNOSIS — F411 Generalized anxiety disorder: Secondary | ICD-10-CM

## 2014-06-19 DIAGNOSIS — B079 Viral wart, unspecified: Secondary | ICD-10-CM

## 2014-06-19 DIAGNOSIS — B20 Human immunodeficiency virus [HIV] disease: Secondary | ICD-10-CM

## 2014-06-19 NOTE — Progress Notes (Signed)
   Subjective:    Patient ID: Marvin Macias, male    DOB: 23-Feb-1971, 43 y.o.   MRN: [id number]  HPI 43 yo M with HIV+, rectal warts, anxiety d/o. R radial fracture with ORIF October 2011. On atripla --> complera.  Has been feeling well.  HIV 1 RNA QUANT (copies/mL)  Date Value  06/03/2014 <20  03/26/2014 <20  09/09/2013 <20   CD4 T CELL ABS (/uL)  Date Value  06/03/2014 1130  03/26/2014 960  09/09/2013 950    Review of Systems  Constitutional: Negative for appetite change and unexpected weight change.  Gastrointestinal: Negative for diarrhea and constipation.  Genitourinary: Negative for difficulty urinating.       Objective:   Physical Exam  Constitutional: He appears well-developed and well-nourished.  HENT:  Mouth/Throat: No oropharyngeal exudate.  Eyes: EOM are normal. Pupils are equal, round, and reactive to light.  Neck: Neck supple.  Cardiovascular: Normal rate, regular rhythm and normal heart sounds.   Pulmonary/Chest: Effort normal and breath sounds normal.  Abdominal: Soft. Bowel sounds are normal. He exhibits no distension. There is no tenderness.  Lymphadenopathy:    He has no cervical adenopathy.  Psychiatric: He has a normal mood and affect.          Assessment & Plan:

## 2014-06-19 NOTE — Assessment & Plan Note (Signed)
Ask him to consider anoscopy

## 2014-06-19 NOTE — Assessment & Plan Note (Signed)
He's doing very well. Will continue his current art. Will see him back in 6 months. Flu shot today. offered/refused condoms. Hep B immune 2008.

## 2014-06-19 NOTE — Assessment & Plan Note (Signed)
Encouraged him to quit 

## 2014-06-19 NOTE — Assessment & Plan Note (Signed)
His mood appears stable. Will continue to watch.

## 2014-07-18 ENCOUNTER — Other Ambulatory Visit: Payer: Self-pay | Admitting: *Deleted

## 2014-07-18 ENCOUNTER — Ambulatory Visit (INDEPENDENT_AMBULATORY_CARE_PROVIDER_SITE_OTHER): Payer: Self-pay | Admitting: Infectious Diseases

## 2014-07-18 VITALS — BP 124/84 | HR 75 | Temp 98.2°F

## 2014-07-18 DIAGNOSIS — B079 Viral wart, unspecified: Secondary | ICD-10-CM

## 2014-07-18 DIAGNOSIS — A609 Anogenital herpesviral infection, unspecified: Secondary | ICD-10-CM

## 2014-07-18 MED ORDER — VALACYCLOVIR HCL 1 G PO TABS: 1000.0000 mg | ORAL_TABLET | Freq: Two times a day (BID) | ORAL | Status: DC

## 2014-07-18 NOTE — Assessment & Plan Note (Signed)
Had HRA at [facility].  Will have him f/u with them in 1 yr from procedure (June 2016)

## 2014-07-18 NOTE — Progress Notes (Signed)
   Subjective:    Patient ID: Marvin Macias, male    DOB: 01/19/1971, 44 y.o.   MRN: [id number]  HPI 44 yo M with HIV+ here for anoscopy. Review of records, and discussion with pt reveals he had this in June 2015 at [facility]. 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 He has been having pain and irritation after having had rectal intercourse lately.    Review of Systems See above    Objective:   Physical Exam  Constitutional: He appears well-developed and well-nourished.  Genitourinary:             Assessment & Plan:

## 2014-07-18 NOTE — Assessment & Plan Note (Signed)
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 Will give him trial of valtrex. Will see him back as regularly scheduled.

## 2014-08-19 ENCOUNTER — Other Ambulatory Visit: Payer: Self-pay | Admitting: Infectious Diseases

## 2014-08-19 DIAGNOSIS — R197 Diarrhea, unspecified: Secondary | ICD-10-CM

## 2014-08-21 ENCOUNTER — Ambulatory Visit: Payer: Self-pay

## 2014-08-25 ENCOUNTER — Telehealth: Payer: Self-pay | Admitting: *Deleted

## 2014-08-25 NOTE — Telephone Encounter (Signed)
Pt has noted improvement in anal area after starting Valtrex.  Requesting ongoing prescription.  Please advise.

## 2014-08-26 ENCOUNTER — Other Ambulatory Visit: Payer: Self-pay | Admitting: *Deleted

## 2014-08-26 DIAGNOSIS — A609 Anogenital herpesviral infection, unspecified: Secondary | ICD-10-CM

## 2014-08-26 DIAGNOSIS — B20 Human immunodeficiency virus [HIV] disease: Secondary | ICD-10-CM

## 2014-08-26 MED ORDER — EMTRICITAB-RILPIVIR-TENOFOV DF 200-25-300 MG PO TABS: 1.0000 | ORAL_TABLET | Freq: Every day | ORAL | Status: DC

## 2014-08-26 MED ORDER — VALACYCLOVIR HCL 1 G PO TABS: 1000.0000 mg | ORAL_TABLET | Freq: Two times a day (BID) | ORAL | Status: DC

## 2014-08-26 NOTE — Telephone Encounter (Signed)
Per Dr. Johnnye Sima ok to refill, Rx sent and patient notified.

## 2014-08-26 NOTE — Telephone Encounter (Signed)
ADAP Application 

## 2014-08-26 NOTE — Telephone Encounter (Signed)
If not better on valtrex, needs appt

## 2014-09-15 ENCOUNTER — Telehealth: Payer: Self-pay | Admitting: *Deleted

## 2014-09-15 NOTE — Telephone Encounter (Addendum)
Continuing anal irritation/pain.  Requesting additional refills until appt that was scheduled with Dr. Johnnye Sima for April 6th.  MD please advise.  Pt is coming to [facility] this morning.  He would appreciate a response so that he can pick up rx at [facility] today.  Pt called back.  He is back in [place] now.  Pt requesting that refill be sent to [facility] [place] when available.  His regular delivery is coming up soon.

## 2014-09-17 ENCOUNTER — Other Ambulatory Visit: Payer: Self-pay | Admitting: Infectious Disease

## 2014-09-17 DIAGNOSIS — B20 Human immunodeficiency virus [HIV] disease: Secondary | ICD-10-CM

## 2014-09-29 ENCOUNTER — Other Ambulatory Visit: Payer: Self-pay | Admitting: *Deleted

## 2014-09-29 DIAGNOSIS — A609 Anogenital herpesviral infection, unspecified: Secondary | ICD-10-CM

## 2014-09-29 MED ORDER — VALACYCLOVIR HCL 1 G PO TABS: 1000.0000 mg | ORAL_TABLET | Freq: Two times a day (BID) | ORAL | Status: DC

## 2014-10-08 ENCOUNTER — Ambulatory Visit: Payer: Self-pay | Admitting: Infectious Diseases

## 2014-10-20 ENCOUNTER — Other Ambulatory Visit: Payer: Self-pay

## 2014-10-20 DIAGNOSIS — A609 Anogenital herpesviral infection, unspecified: Secondary | ICD-10-CM

## 2014-10-20 MED ORDER — VALACYCLOVIR HCL 1 G PO TABS: 1000.0000 mg | ORAL_TABLET | Freq: Two times a day (BID) | ORAL | Status: DC

## 2014-10-20 NOTE — Telephone Encounter (Signed)
Patient requesting maintenance dose of valtrex. Medications sent to pharmacy .   Laverle Patter, RN

## 2014-12-08 ENCOUNTER — Other Ambulatory Visit: Payer: Self-pay

## 2014-12-08 DIAGNOSIS — B20 Human immunodeficiency virus [HIV] disease: Secondary | ICD-10-CM

## 2014-12-08 DIAGNOSIS — Z113 Encounter for screening for infections with a predominantly sexual mode of transmission: Secondary | ICD-10-CM

## 2014-12-08 DIAGNOSIS — Z79899 Other long term (current) drug therapy: Secondary | ICD-10-CM

## 2014-12-08 LAB — CBC
HEMATOCRIT: 46.8 % (ref 39.0–52.0)
Hemoglobin: 16.1 g/dL (ref 13.0–17.0)
MCH: 33.6 pg (ref 26.0–34.0)
MCHC: 34.4 g/dL (ref 30.0–36.0)
MCV: 97.7 fL (ref 78.0–100.0)
MPV: 9.9 fL (ref 8.6–12.4)
PLATELETS: 240 10*3/uL (ref 150–400)
RBC: 4.79 MIL/uL (ref 4.22–5.81)
RDW: 15.6 % — ABNORMAL HIGH (ref 11.5–15.5)
WBC: 10.8 10*3/uL — ABNORMAL HIGH (ref 4.0–10.5)

## 2014-12-08 LAB — COMPREHENSIVE METABOLIC PANEL
ALT: 15 U/L (ref 0–53)
AST: 18 U/L (ref 0–37)
Albumin: 4.2 g/dL (ref 3.5–5.2)
Alkaline Phosphatase: 59 U/L (ref 39–117)
BUN: 13 mg/dL (ref 6–23)
CO2: 28 meq/L (ref 19–32)
Calcium: 9.4 mg/dL (ref 8.4–10.5)
Chloride: 101 mEq/L (ref 96–112)
Creat: 0.89 mg/dL (ref 0.50–1.35)
Glucose, Bld: 92 mg/dL (ref 70–99)
POTASSIUM: 4.5 meq/L (ref 3.5–5.3)
SODIUM: 138 meq/L (ref 135–145)
Total Bilirubin: 0.6 mg/dL (ref 0.2–1.2)
Total Protein: 6.7 g/dL (ref 6.0–8.3)

## 2014-12-08 LAB — LIPID PANEL
CHOLESTEROL: 174 mg/dL (ref 0–200)
HDL: 43 mg/dL (ref >=40)
LDL Cholesterol: 104 mg/dL — ABNORMAL HIGH (ref 0–99)
TRIGLYCERIDES: 133 mg/dL (ref <150)
Total CHOL/HDL Ratio: 4 Ratio
VLDL: 27 mg/dL (ref 0–40)

## 2014-12-09 LAB — RPR

## 2014-12-10 LAB — T-HELPER CELL (CD4) - (RCID CLINIC ONLY)
CD4 T CELL HELPER: 33 % (ref 33–55)
CD4 T Cell Abs: 1280 /uL (ref 400–2700)

## 2014-12-10 LAB — HIV-1 RNA QUANT-NO REFLEX-BLD: HIV-1 RNA Quant, Log: <1.3 {Log} (ref <1.30)

## 2014-12-22 ENCOUNTER — Ambulatory Visit: Payer: Self-pay | Admitting: Infectious Diseases

## 2015-01-15 ENCOUNTER — Other Ambulatory Visit: Payer: Self-pay | Admitting: Infectious Diseases

## 2015-01-16 ENCOUNTER — Other Ambulatory Visit: Payer: Self-pay | Admitting: Licensed Clinical Social Worker

## 2015-01-16 DIAGNOSIS — R197 Diarrhea, unspecified: Secondary | ICD-10-CM

## 2015-01-16 MED ORDER — DIPHENOXYLATE-ATROPINE 2.5-0.025 MG PO TABS: 1.0000 | ORAL_TABLET | Freq: Four times a day (QID) | ORAL | Status: DC | PRN

## 2015-01-19 ENCOUNTER — Encounter: Payer: Self-pay | Admitting: Infectious Diseases

## 2015-01-19 ENCOUNTER — Ambulatory Visit (INDEPENDENT_AMBULATORY_CARE_PROVIDER_SITE_OTHER): Payer: Self-pay | Admitting: Infectious Diseases

## 2015-01-19 VITALS — BP 132/91 | HR 98 | Temp 97.7°F | Wt 138.0 lb

## 2015-01-19 DIAGNOSIS — Z113 Encounter for screening for infections with a predominantly sexual mode of transmission: Secondary | ICD-10-CM

## 2015-01-19 DIAGNOSIS — A609 Anogenital herpesviral infection, unspecified: Secondary | ICD-10-CM

## 2015-01-19 DIAGNOSIS — F411 Generalized anxiety disorder: Secondary | ICD-10-CM

## 2015-01-19 DIAGNOSIS — B20 Human immunodeficiency virus [HIV] disease: Secondary | ICD-10-CM

## 2015-01-19 DIAGNOSIS — Z79899 Other long term (current) drug therapy: Secondary | ICD-10-CM

## 2015-01-19 DIAGNOSIS — R64 Cachexia: Secondary | ICD-10-CM

## 2015-01-19 DIAGNOSIS — F17200 Nicotine dependence, unspecified, uncomplicated: Secondary | ICD-10-CM

## 2015-01-19 DIAGNOSIS — Z720 Tobacco use: Secondary | ICD-10-CM

## 2015-01-19 NOTE — Assessment & Plan Note (Signed)
Continues on marinol.

## 2015-01-19 NOTE — Assessment & Plan Note (Signed)
Working on [employer]

## 2015-01-19 NOTE — Assessment & Plan Note (Signed)
At baseline 

## 2015-01-19 NOTE — Progress Notes (Signed)
   Subjective:    Patient ID: Marvin Macias, male    DOB: 1970-12-04, 44 y.o.   MRN: [id number]  HPI 44 yo M with HIV+, rectal warts, anxiety d/o. R radial fracture with ORIF October 2011. On atripla --> complera. 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 Has had f/u wt [facility] as well (10-2014).   HIV 1 RNA QUANT (copies/mL)  Date Value  12/08/2014 <20  06/03/2014 <20  03/26/2014 <20   CD4 T CELL ABS (/uL)  Date Value  12/08/2014 1280  06/03/2014 1130  03/26/2014 960   Review of Systems  Constitutional: Negative for appetite change and unexpected weight change.  Gastrointestinal: Negative for diarrhea and constipation.  Genitourinary: Negative for difficulty urinating.       Objective:   Physical Exam  Constitutional: He appears well-developed and well-nourished.  Eyes: EOM are normal. Pupils are equal, round, and reactive to light.  Neck: Neck supple.  Cardiovascular: Normal rate, regular rhythm and normal heart sounds.   Pulmonary/Chest: Effort normal and breath sounds normal.  Abdominal: Soft. Bowel sounds are normal. There is no tenderness.  Lymphadenopathy:    He has no cervical adenopathy.      Assessment & Plan:

## 2015-01-19 NOTE — Assessment & Plan Note (Signed)
On prophylactic VTX

## 2015-01-19 NOTE — Assessment & Plan Note (Signed)
Doing very well.  Offered/refused condoms.  Will continue complera, take with food.  rtc in 6 months.

## 2015-02-04 ENCOUNTER — Telehealth: Payer: Self-pay | Admitting: *Deleted

## 2015-02-04 NOTE — Telephone Encounter (Signed)
Called the patient an left a message for him to call the office and schedule an appt for ADAP renewal.

## 2015-03-18 ENCOUNTER — Other Ambulatory Visit: Payer: Self-pay | Admitting: Infectious Diseases

## 2015-04-15 ENCOUNTER — Other Ambulatory Visit: Payer: Self-pay | Admitting: Infectious Diseases

## 2015-04-15 DIAGNOSIS — R197 Diarrhea, unspecified: Secondary | ICD-10-CM

## 2015-05-15 ENCOUNTER — Other Ambulatory Visit: Payer: Self-pay | Admitting: Infectious Diseases

## 2015-06-16 ENCOUNTER — Other Ambulatory Visit: Payer: Self-pay | Admitting: Infectious Diseases

## 2015-06-16 DIAGNOSIS — B20 Human immunodeficiency virus [HIV] disease: Secondary | ICD-10-CM

## 2015-07-14 ENCOUNTER — Other Ambulatory Visit (INDEPENDENT_AMBULATORY_CARE_PROVIDER_SITE_OTHER): Payer: Self-pay

## 2015-07-14 DIAGNOSIS — Z79899 Other long term (current) drug therapy: Secondary | ICD-10-CM

## 2015-07-14 DIAGNOSIS — Z113 Encounter for screening for infections with a predominantly sexual mode of transmission: Secondary | ICD-10-CM

## 2015-07-14 DIAGNOSIS — B20 Human immunodeficiency virus [HIV] disease: Secondary | ICD-10-CM

## 2015-07-14 LAB — COMPREHENSIVE METABOLIC PANEL
ALBUMIN: 4.3 g/dL (ref 3.6–5.1)
ALK PHOS: 55 U/L (ref 40–115)
ALT: 26 U/L (ref 9–46)
AST: 24 U/L (ref 10–40)
BUN: 14 mg/dL (ref 7–25)
CALCIUM: 9.5 mg/dL (ref 8.6–10.3)
CO2: 23 mmol/L (ref 20–31)
Chloride: 102 mmol/L (ref 98–110)
Creat: 0.9 mg/dL (ref 0.60–1.35)
Glucose, Bld: 105 mg/dL — ABNORMAL HIGH (ref 65–99)
POTASSIUM: 5.1 mmol/L (ref 3.5–5.3)
Sodium: 139 mmol/L (ref 135–146)
TOTAL PROTEIN: 6.7 g/dL (ref 6.1–8.1)
Total Bilirubin: 0.3 mg/dL (ref 0.2–1.2)

## 2015-07-14 LAB — CBC
HCT: 50.7 % (ref 39.0–52.0)
HEMOGLOBIN: 17.6 g/dL — AB (ref 13.0–17.0)
MCH: 34.4 pg — AB (ref 26.0–34.0)
MCHC: 34.7 g/dL (ref 30.0–36.0)
MCV: 99.2 fL (ref 78.0–100.0)
MPV: 10.3 fL (ref 8.6–12.4)
Platelets: 255 10*3/uL (ref 150–400)
RBC: 5.11 MIL/uL (ref 4.22–5.81)
RDW: 14.1 % (ref 11.5–15.5)
WBC: 10 10*3/uL (ref 4.0–10.5)

## 2015-07-14 LAB — LIPID PANEL
Cholesterol: 169 mg/dL (ref 125–200)
HDL: 45 mg/dL (ref >=40)
LDL Cholesterol: 111 mg/dL (ref <130)
Total CHOL/HDL Ratio: 3.8 Ratio (ref <=5.0)
Triglycerides: 66 mg/dL (ref <150)
VLDL: 13 mg/dL (ref <30)

## 2015-07-14 NOTE — Addendum Note (Signed)
Addended by: Dolan Amen D on: 07/14/2015 02:43 PM   Modules accepted: Orders

## 2015-07-15 ENCOUNTER — Other Ambulatory Visit: Payer: Self-pay | Admitting: Infectious Diseases

## 2015-07-15 LAB — T-HELPER CELL (CD4) - (RCID CLINIC ONLY)
CD4 % Helper T Cell: 36 % (ref 33–55)
CD4 T CELL ABS: 890 /uL (ref 400–2700)

## 2015-07-15 LAB — RPR

## 2015-07-16 LAB — HIV-1 RNA QUANT-NO REFLEX-BLD

## 2015-07-27 ENCOUNTER — Ambulatory Visit (INDEPENDENT_AMBULATORY_CARE_PROVIDER_SITE_OTHER): Payer: Self-pay | Admitting: Infectious Diseases

## 2015-07-27 ENCOUNTER — Encounter: Payer: Self-pay | Admitting: Infectious Diseases

## 2015-07-27 VITALS — BP 138/98 | HR 108 | Temp 98.0°F | Wt 141.2 lb

## 2015-07-27 DIAGNOSIS — Z79899 Other long term (current) drug therapy: Secondary | ICD-10-CM

## 2015-07-27 DIAGNOSIS — B20 Human immunodeficiency virus [HIV] disease: Secondary | ICD-10-CM

## 2015-07-27 DIAGNOSIS — Z113 Encounter for screening for infections with a predominantly sexual mode of transmission: Secondary | ICD-10-CM

## 2015-07-27 DIAGNOSIS — B079 Viral wart, unspecified: Secondary | ICD-10-CM

## 2015-07-27 DIAGNOSIS — Z23 Encounter for immunization: Secondary | ICD-10-CM

## 2015-07-27 MED ORDER — EMTRICITAB-RILPIVIR-TENOFOV AF 200-25-25 MG PO TABS: 1.0000 | ORAL_TABLET | Freq: Every day | ORAL | Status: DC

## 2015-07-27 NOTE — Addendum Note (Signed)
Addended by: Lorne Skeens D on: 07/27/2015 09:04 AM   Modules accepted: Medications

## 2015-07-27 NOTE — Assessment & Plan Note (Signed)
Well controlled by his report.  Will have him f/u at [facility] prn.

## 2015-07-27 NOTE — Progress Notes (Signed)
   Subjective:    Patient ID: Marvin Macias, male    DOB: 07-Oct-1970, 45 y.o.   MRN: [id number]  HPI 45 yo M with HIV+, rectal warts, anxiety d/o. R radial fracture with ORIF October 2011. On atripla --> complera. 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 Has had f/u wt [facility] as well (10-2014).   HIV 1 RNA QUANT (copies/mL)  Date Value  07/14/2015 <20  12/08/2014 <20  06/03/2014 <20   CD4 T CELL ABS (/uL)  Date Value  07/14/2015 890  12/08/2014 1280  06/03/2014 1130    No problems with ART.  Has been feeling well.  No issues with his rectum- no lumps, bumps, warts. No recent f/u at [facility].   Review of Systems Please see HPI. 12 point ROS o/w (-)     Objective:   Physical Exam  Constitutional: He appears well-developed and well-nourished.  HENT:  Mouth/Throat: No oropharyngeal exudate.  Eyes: EOM are normal. Pupils are equal, round, and reactive to light.  Neck: Neck supple.  Cardiovascular: Normal rate, regular rhythm and normal heart sounds.   Pulmonary/Chest: Effort normal and breath sounds normal.  Abdominal: Soft. Bowel sounds are normal. There is no tenderness. There is no rebound.  Lymphadenopathy:    He has no cervical adenopathy.          Assessment & Plan:

## 2015-07-27 NOTE — Assessment & Plan Note (Signed)
Will change him to Marvin Macias.  Offered/refused condoms.  Gets flu shot today.  Will see him back in 6 months.

## 2015-08-17 ENCOUNTER — Other Ambulatory Visit: Payer: Self-pay | Admitting: Infectious Diseases

## 2015-08-17 DIAGNOSIS — B029 Zoster without complications: Secondary | ICD-10-CM

## 2015-08-17 DIAGNOSIS — R197 Diarrhea, unspecified: Secondary | ICD-10-CM

## 2015-08-20 ENCOUNTER — Telehealth: Payer: Self-pay | Admitting: *Deleted

## 2015-08-20 NOTE — Telephone Encounter (Signed)
Generalized body aches, chills, fever, nasal drainage and cough started this morning.  Pt wondering what he should take.  If a prescription is needed he would like it sent to [facility] in [place], [place].  Pt received Flu Vaccine 07/27/15.   MD please advise about medications if appropriate.

## 2015-08-21 ENCOUNTER — Other Ambulatory Visit: Payer: Self-pay | Admitting: *Deleted

## 2015-08-21 DIAGNOSIS — J111 Influenza due to unidentified influenza virus with other respiratory manifestations: Secondary | ICD-10-CM

## 2015-08-21 MED ORDER — OSELTAMIVIR PHOSPHATE 75 MG PO CAPS: 75.0000 mg | ORAL_CAPSULE | Freq: Two times a day (BID) | ORAL | Status: AC

## 2015-08-21 MED ORDER — OSELTAMIVIR PHOSPHATE 75 MG PO CAPS: 75.0000 mg | ORAL_CAPSULE | Freq: Two times a day (BID) | ORAL | Status: DC

## 2015-08-21 NOTE — Telephone Encounter (Signed)
Sorry, now there are three choices of tamiflu. Which one do I add to med list

## 2015-08-21 NOTE — Telephone Encounter (Signed)
done

## 2015-08-21 NOTE — Telephone Encounter (Signed)
Please call in rx for tamiflu thanks

## 2015-08-21 NOTE — Telephone Encounter (Signed)
Patient could not afford; not covered under ADAP. He is feeling better today, thinks its sinus. Marvin Macias

## 2015-09-25 ENCOUNTER — Telehealth: Payer: Self-pay | Admitting: *Deleted

## 2015-09-25 DIAGNOSIS — Z113 Encounter for screening for infections with a predominantly sexual mode of transmission: Secondary | ICD-10-CM

## 2015-09-25 NOTE — Telephone Encounter (Signed)
Needs to get GC/chlamydia, rpr done asap

## 2015-09-25 NOTE — Telephone Encounter (Signed)
Patient on valtrex 1000 mg twice daily. He states that this is not helping the "patchy areas" on the shaft of his penis.  It is still painful and itchy.  He had been on medication consistently when this came up, it has not changed in 2+ weeks.  He does endorse 1 unprotected encounter since last STD testing. Patient also reporting bright red blood per rectum with a hard stool, small amount on the paper when he wipes.  He will continue to watch this, will improve his diet to avoid constipation. [name], RN

## 2015-09-28 ENCOUNTER — Telehealth: Payer: Self-pay | Admitting: *Deleted

## 2015-09-28 NOTE — Addendum Note (Signed)
Addended by: [name] on: 09/28/2015 09:41 AM   Modules accepted: Orders

## 2015-09-28 NOTE — Telephone Encounter (Signed)
Patient will come Friday 3/31 for labs.  Thanks.

## 2015-09-28 NOTE — Telephone Encounter (Signed)
error 

## 2015-10-02 ENCOUNTER — Other Ambulatory Visit (INDEPENDENT_AMBULATORY_CARE_PROVIDER_SITE_OTHER): Payer: Self-pay

## 2015-10-02 DIAGNOSIS — Z113 Encounter for screening for infections with a predominantly sexual mode of transmission: Secondary | ICD-10-CM

## 2015-10-02 DIAGNOSIS — B20 Human immunodeficiency virus [HIV] disease: Secondary | ICD-10-CM

## 2015-10-02 DIAGNOSIS — Z79899 Other long term (current) drug therapy: Secondary | ICD-10-CM

## 2015-10-02 LAB — COMPREHENSIVE METABOLIC PANEL
ALBUMIN: 4.5 g/dL (ref 3.6–5.1)
ALT: 17 U/L (ref 9–46)
AST: 17 U/L (ref 10–40)
Alkaline Phosphatase: 63 U/L (ref 40–115)
BUN: 8 mg/dL (ref 7–25)
CHLORIDE: 104 mmol/L (ref 98–110)
CO2: 25 mmol/L (ref 20–31)
CREATININE: 0.93 mg/dL (ref 0.60–1.35)
Calcium: 9.2 mg/dL (ref 8.6–10.3)
Glucose, Bld: 113 mg/dL — ABNORMAL HIGH (ref 65–99)
POTASSIUM: 4 mmol/L (ref 3.5–5.3)
SODIUM: 139 mmol/L (ref 135–146)
TOTAL PROTEIN: 6.7 g/dL (ref 6.1–8.1)
Total Bilirubin: 0.7 mg/dL (ref 0.2–1.2)

## 2015-10-02 LAB — CBC
HEMATOCRIT: 48.9 % (ref 39.0–52.0)
HEMOGLOBIN: 16.5 g/dL (ref 13.0–17.0)
MCH: 35 pg — AB (ref 26.0–34.0)
MCHC: 33.7 g/dL (ref 30.0–36.0)
MCV: 103.8 fL — AB (ref 78.0–100.0)
MPV: 10.5 fL (ref 8.6–12.4)
Platelets: 266 10*3/uL (ref 150–400)
RBC: 4.71 MIL/uL (ref 4.22–5.81)
RDW: 13.1 % (ref 11.5–15.5)
WBC: 8.9 10*3/uL (ref 4.0–10.5)

## 2015-10-02 LAB — LIPID PANEL
Cholesterol: 210 mg/dL — ABNORMAL HIGH (ref 125–200)
HDL: 48 mg/dL
LDL Cholesterol: 140 mg/dL — ABNORMAL HIGH
Total CHOL/HDL Ratio: 4.4 ratio
Triglycerides: 110 mg/dL
VLDL: 22 mg/dL

## 2015-10-02 LAB — T-HELPER CELL (CD4) - (RCID CLINIC ONLY)
CD4 % Helper T Cell: 36 % (ref 33–55)
CD4 T Cell Abs: 900 /uL (ref 400–2700)

## 2015-10-03 LAB — RPR

## 2015-10-05 LAB — HIV-1 RNA QUANT-NO REFLEX-BLD
HIV 1 RNA Quant: <20 copies/mL (ref <20)
HIV-1 RNA Quant, Log: <1.3 Log copies/mL (ref <1.30)

## 2015-10-05 LAB — URINE CYTOLOGY ANCILLARY ONLY
CHLAMYDIA, DNA PROBE: NEGATIVE
Neisseria Gonorrhea: NEGATIVE

## 2015-10-13 ENCOUNTER — Telehealth: Payer: Self-pay | Admitting: *Deleted

## 2015-10-13 DIAGNOSIS — L0292 Furuncle, unspecified: Secondary | ICD-10-CM

## 2015-10-13 NOTE — Telephone Encounter (Signed)
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  Previously treated with doxycycline.  Pt requesting refill for this medication.  Please send to [facility].

## 2015-10-14 ENCOUNTER — Other Ambulatory Visit: Payer: Self-pay | Admitting: Infectious Diseases

## 2015-10-14 DIAGNOSIS — R630 Anorexia: Secondary | ICD-10-CM

## 2015-10-14 MED ORDER — DOXYCYCLINE HYCLATE 100 MG PO TABS: 100.0000 mg | ORAL_TABLET | Freq: Two times a day (BID) | ORAL | Status: DC

## 2015-10-14 NOTE — Addendum Note (Signed)
Addended by: Michel Bickers on: 10/14/2015 09:48 AM   Modules accepted: Orders

## 2015-10-14 NOTE — Telephone Encounter (Signed)
I sent a refill request for doxycycline to [facility].

## 2015-10-15 NOTE — Telephone Encounter (Signed)
Patient notified

## 2015-11-16 ENCOUNTER — Other Ambulatory Visit: Payer: Self-pay | Admitting: Internal Medicine

## 2015-11-16 DIAGNOSIS — L0292 Furuncle, unspecified: Secondary | ICD-10-CM

## 2015-11-16 NOTE — Telephone Encounter (Signed)
Pt wanting to refill antibiotic for infected hair follicle previously filled by Dr. Megan Salon.  Dr. Johnnye Sima, Abie to refill Doxycycline?  How many refills?

## 2015-11-17 ENCOUNTER — Other Ambulatory Visit: Payer: Self-pay | Admitting: Internal Medicine

## 2015-11-17 MED ORDER — DOXYCYCLINE HYCLATE 100 MG PO TABS: 100.0000 mg | ORAL_TABLET | Freq: Two times a day (BID) | ORAL | Status: DC

## 2015-11-17 NOTE — Telephone Encounter (Signed)
Ok to refill,      20 tab, 0 refills per Dr. Johnnye Sima

## 2015-11-17 NOTE — Addendum Note (Signed)
Addended by: Lorne Skeens D on: 11/17/2015 05:11 PM   Modules accepted: Orders

## 2015-12-17 ENCOUNTER — Other Ambulatory Visit: Payer: Self-pay | Admitting: Infectious Diseases

## 2015-12-17 DIAGNOSIS — R197 Diarrhea, unspecified: Secondary | ICD-10-CM

## 2016-01-21 ENCOUNTER — Encounter: Payer: Self-pay | Admitting: Infectious Diseases

## 2016-02-02 ENCOUNTER — Other Ambulatory Visit: Payer: Self-pay | Admitting: Infectious Diseases

## 2016-02-02 DIAGNOSIS — B029 Zoster without complications: Secondary | ICD-10-CM

## 2016-02-24 ENCOUNTER — Other Ambulatory Visit: Payer: Self-pay | Admitting: Infectious Diseases

## 2016-02-24 DIAGNOSIS — R630 Anorexia: Secondary | ICD-10-CM

## 2016-02-24 DIAGNOSIS — R197 Diarrhea, unspecified: Secondary | ICD-10-CM

## 2016-03-25 ENCOUNTER — Other Ambulatory Visit: Payer: Self-pay | Admitting: Infectious Diseases

## 2016-03-25 DIAGNOSIS — B029 Zoster without complications: Secondary | ICD-10-CM

## 2016-04-07 ENCOUNTER — Other Ambulatory Visit (INDEPENDENT_AMBULATORY_CARE_PROVIDER_SITE_OTHER): Payer: Self-pay

## 2016-04-07 DIAGNOSIS — Z79899 Other long term (current) drug therapy: Secondary | ICD-10-CM

## 2016-04-07 DIAGNOSIS — B20 Human immunodeficiency virus [HIV] disease: Secondary | ICD-10-CM

## 2016-04-07 LAB — COMPLETE METABOLIC PANEL WITH GFR
ALBUMIN: 4.2 g/dL (ref 3.6–5.1)
ALK PHOS: 54 U/L (ref 40–115)
ALT: 16 U/L (ref 9–46)
AST: 17 U/L (ref 10–40)
BUN: 12 mg/dL (ref 7–25)
CALCIUM: 9.4 mg/dL (ref 8.6–10.3)
CO2: 25 mmol/L (ref 20–31)
CREATININE: 1.05 mg/dL (ref 0.60–1.35)
Chloride: 104 mmol/L (ref 98–110)
GFR, Est African American: >89 mL/min (ref >=60)
GFR, Est Non African American: 84 mL/min (ref >=60)
GLUCOSE: 92 mg/dL (ref 65–99)
POTASSIUM: 4.3 mmol/L (ref 3.5–5.3)
SODIUM: 140 mmol/L (ref 135–146)
TOTAL PROTEIN: 6.4 g/dL (ref 6.1–8.1)
Total Bilirubin: 0.6 mg/dL (ref 0.2–1.2)

## 2016-04-07 LAB — LIPID PANEL
CHOL/HDL RATIO: 4 ratio (ref <=5.0)
Cholesterol: 171 mg/dL (ref 125–200)
HDL: 43 mg/dL (ref >=40)
LDL Cholesterol: 105 mg/dL (ref <130)
Triglycerides: 116 mg/dL (ref <150)
VLDL: 23 mg/dL (ref <30)

## 2016-04-08 LAB — HIV-1 RNA QUANT-NO REFLEX-BLD
HIV 1 RNA QUANT: 170 {copies}/mL — AB (ref <20)
HIV-1 RNA QUANT, LOG: 2.23 {Log_copies}/mL — AB (ref <1.30)

## 2016-04-08 LAB — CBC WITH DIFFERENTIAL/PLATELET
BASOS ABS: 82 {cells}/uL (ref 0–200)
BASOS PCT: 1 %
EOS ABS: 82 {cells}/uL (ref 15–500)
Eosinophils Relative: 1 %
HEMATOCRIT: 50.9 % — AB (ref 38.5–50.0)
HEMOGLOBIN: 17.8 g/dL — AB (ref 13.2–17.1)
LYMPHS PCT: 33 %
Lymphs Abs: 2706 cells/uL (ref 850–3900)
MCH: 35.4 pg — ABNORMAL HIGH (ref 27.0–33.0)
MCHC: 35 g/dL (ref 32.0–36.0)
MCV: 101.2 fL — ABNORMAL HIGH (ref 80.0–100.0)
MONOS PCT: 7 %
MPV: 10.2 fL (ref 7.5–12.5)
Monocytes Absolute: 574 cells/uL (ref 200–950)
Neutro Abs: 4756 cells/uL (ref 1500–7800)
Neutrophils Relative %: 58 %
Platelets: 206 10*3/uL (ref 140–400)
RBC: 5.03 MIL/uL (ref 4.20–5.80)
RDW: 13.7 % (ref 11.0–15.0)
WBC: 8.2 10*3/uL (ref 3.8–10.8)

## 2016-04-08 LAB — HEPATITIS C ANTIBODY: HCV Ab: NEGATIVE

## 2016-04-08 LAB — T-HELPER CELL (CD4) - (RCID CLINIC ONLY)
CD4 T CELL HELPER: 34 % (ref 33–55)
CD4 T Cell Abs: 860 /uL (ref 400–2700)

## 2016-04-18 ENCOUNTER — Other Ambulatory Visit: Payer: Self-pay | Admitting: Infectious Diseases

## 2016-04-18 DIAGNOSIS — R630 Anorexia: Secondary | ICD-10-CM

## 2016-04-18 DIAGNOSIS — R197 Diarrhea, unspecified: Secondary | ICD-10-CM

## 2016-04-20 ENCOUNTER — Ambulatory Visit (INDEPENDENT_AMBULATORY_CARE_PROVIDER_SITE_OTHER): Payer: Self-pay | Admitting: Infectious Diseases

## 2016-04-20 ENCOUNTER — Encounter: Payer: Self-pay | Admitting: Infectious Diseases

## 2016-04-20 VITALS — BP 144/98 | HR 84 | Temp 98.8°F | Ht 67.0 in | Wt 148.5 lb

## 2016-04-20 DIAGNOSIS — F17200 Nicotine dependence, unspecified, uncomplicated: Secondary | ICD-10-CM

## 2016-04-20 DIAGNOSIS — R64 Cachexia: Secondary | ICD-10-CM

## 2016-04-20 DIAGNOSIS — I158 Other secondary hypertension: Secondary | ICD-10-CM

## 2016-04-20 DIAGNOSIS — I10 Essential (primary) hypertension: Secondary | ICD-10-CM | POA: Insufficient documentation

## 2016-04-20 DIAGNOSIS — B20 Human immunodeficiency virus [HIV] disease: Secondary | ICD-10-CM

## 2016-04-20 DIAGNOSIS — F411 Generalized anxiety disorder: Secondary | ICD-10-CM

## 2016-04-20 DIAGNOSIS — Z23 Encounter for immunization: Secondary | ICD-10-CM

## 2016-04-20 LAB — TSH: TSH: 2.52 m[IU]/L (ref 0.40–4.50)

## 2016-04-20 NOTE — Assessment & Plan Note (Addendum)
Will recheck at f/u.  Encouraged to stop smoking.  He will call if he has further chest dyscomfort.  He significant family stress.

## 2016-04-20 NOTE — Assessment & Plan Note (Signed)
Encouraged to quit smoking.  

## 2016-04-20 NOTE — Assessment & Plan Note (Signed)
Wants to defer his anal pap for next visit.  Gets flu shot today.  Offered/refused condoms.  Will recheck his VL today.  Encouraged him to take his ART with food (he swears adherence).

## 2016-04-20 NOTE — Assessment & Plan Note (Signed)
Has significant family stress, work stress.  Offered, jodi, he defers.

## 2016-04-20 NOTE — Progress Notes (Signed)
   Subjective:    Patient ID: Marvin Macias, male    DOB: 1970/10/25, 45 y.o.   MRN: [id number]  HPI 45 yo M with HIV+, rectal warts, anxiety d/o. R radial fracture with ORIF October 2011. On atripla --> complera --> odefsy (07-2015). 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 Has had f/u wt [facility] as well (10-2014).    HIV 1 RNA Quant (copies/mL)  Date Value  04/07/2016 170 (H)  10/02/2015 <20  07/14/2015 <20   CD4 T Cell Abs (/uL)  Date Value  04/07/2016 860  10/02/2015 900  07/14/2015 890   He is hypertensive today. work has been very busy of recent.  Denies headaches, shortness of breath. He is not able to clearly say whether or not he has had CP.   Review of Systems  Constitutional: Negative for appetite change and unexpected weight change.  Respiratory: Negative for shortness of breath.   Gastrointestinal: Negative for constipation and diarrhea.  Genitourinary: Negative for difficulty urinating.  Neurological: Negative for headaches.       Objective:   Physical Exam  Constitutional: He appears well-developed and well-nourished.  HENT:  Mouth/Throat: No oropharyngeal exudate.  Eyes: EOM are normal. Pupils are equal, round, and reactive to light.  Neck: Neck supple.  Cardiovascular: Normal rate, regular rhythm and normal heart sounds.   Pulmonary/Chest: Effort normal and breath sounds normal.  Abdominal: Soft. Bowel sounds are normal. There is no tenderness. There is no rebound.  Musculoskeletal: He exhibits no edema.  Lymphadenopathy:    He has no cervical adenopathy.      Assessment & Plan:

## 2016-04-20 NOTE — Assessment & Plan Note (Signed)
Gaining wt, doing well.  Will resolve

## 2016-04-21 ENCOUNTER — Telehealth: Payer: Self-pay | Admitting: Infectious Diseases

## 2016-04-21 NOTE — Telephone Encounter (Signed)
Patietn returning [name] call, states "I don't really know what to say about it."  He describes his pain as Intermittent dull chest pain, "a weird feeling." Pt states this does not occur every day. Patient does not notice any thing in particular that makes it better or worse.  He states he knows when to call 911 for chest pain. Patient was anxious to get off the phone, did not want to discuss this further. [name], RN

## 2016-04-21 NOTE — Telephone Encounter (Signed)
Called pt to f/u on vague chest sx he described at last visit.  left message for him to call back (as he was told in his visit).

## 2016-04-22 LAB — HIV-1 RNA ULTRAQUANT REFLEX TO GENTYP+
HIV 1 RNA QUANT: 84 {copies}/mL — AB (ref <20)
HIV-1 RNA QUANT, LOG: 1.92 {Log_copies}/mL — AB (ref <1.30)

## 2016-04-27 ENCOUNTER — Telehealth: Payer: Self-pay | Admitting: Infectious Diseases

## 2016-04-27 ENCOUNTER — Telehealth: Payer: Self-pay | Admitting: *Deleted

## 2016-04-27 NOTE — Telephone Encounter (Signed)
Shared VL and TSH results with the patient.  Reinforced taking his HIV medication with his largest meal of the day to enhance absorption.  Patient verbalized understanding.

## 2016-04-27 NOTE — Telephone Encounter (Signed)
Called pt about his vague sx of chest dyscomfort at previous visit.  Left message for him to return call.

## 2016-05-18 ENCOUNTER — Other Ambulatory Visit: Payer: Self-pay | Admitting: Infectious Diseases

## 2016-05-18 DIAGNOSIS — R197 Diarrhea, unspecified: Secondary | ICD-10-CM

## 2016-05-30 ENCOUNTER — Telehealth: Payer: Self-pay | Admitting: Infectious Diseases

## 2016-05-30 NOTE — Telephone Encounter (Signed)
Called pt.  He still is having intermint CP. Weekly. Caused by ? Relieved by ? Feels in his arms, both. And legs.  No SOB, no nausea.  Will send him for stress test.  He wishes to defer til January.  He call if he has any change in his sx.  He believes it is related to shoulder strain.

## 2016-06-13 ENCOUNTER — Telehealth: Payer: Self-pay | Admitting: *Deleted

## 2016-06-13 NOTE — Telephone Encounter (Signed)
Lomotil rx has been recalled by the manufacturer, needs substitute.  Pt currently taking Imodium which is not a effective at Lomotil.  Is there a substitute for Lomotil that can be sent to [facility] in [place]?

## 2016-06-14 ENCOUNTER — Other Ambulatory Visit: Payer: Self-pay | Admitting: Pharmacist Clinician (PhC)/ Clinical Pharmacy Specialist

## 2016-06-15 ENCOUNTER — Other Ambulatory Visit: Payer: Self-pay | Admitting: Pharmacist Clinician (PhC)/ Clinical Pharmacy Specialist

## 2016-06-15 ENCOUNTER — Other Ambulatory Visit: Payer: Self-pay | Admitting: Infectious Diseases

## 2016-06-15 DIAGNOSIS — B20 Human immunodeficiency virus [HIV] disease: Secondary | ICD-10-CM

## 2016-06-15 DIAGNOSIS — B029 Zoster without complications: Secondary | ICD-10-CM

## 2016-06-15 MED ORDER — CROFELEMER 125 MG PO TBEC: 125.0000 mg | DELAYED_RELEASE_TABLET | Freq: Two times a day (BID) | ORAL | 3 refills | Status: DC

## 2016-06-15 NOTE — Progress Notes (Signed)
thanks

## 2016-06-15 NOTE — Progress Notes (Signed)
Since there is a recall issue with Lomotil, we'll try Mytesi (crofelemer) since it's on the ADAP formulary. He said that he still have issue with diarrhea.

## 2016-06-16 ENCOUNTER — Other Ambulatory Visit: Payer: Self-pay | Admitting: Infectious Diseases

## 2016-06-16 DIAGNOSIS — R197 Diarrhea, unspecified: Secondary | ICD-10-CM

## 2016-06-20 NOTE — Telephone Encounter (Signed)
Pharmacy notified for lomotil being discontinued and new rx for [place] [facility] has been sent.

## 2016-06-20 NOTE — Addendum Note (Signed)
Addended by: Lorne Skeens D on: 06/20/2016 11:07 AM   Modules accepted: Orders

## 2016-07-06 ENCOUNTER — Other Ambulatory Visit: Payer: Self-pay

## 2016-07-06 ENCOUNTER — Encounter: Payer: Self-pay | Admitting: Infectious Diseases

## 2016-07-06 ENCOUNTER — Ambulatory Visit (INDEPENDENT_AMBULATORY_CARE_PROVIDER_SITE_OTHER): Payer: Self-pay | Admitting: Infectious Diseases

## 2016-07-06 VITALS — BP 155/108 | HR 90 | Temp 99.0°F | Ht 67.0 in | Wt 150.0 lb

## 2016-07-06 DIAGNOSIS — B20 Human immunodeficiency virus [HIV] disease: Secondary | ICD-10-CM

## 2016-07-06 DIAGNOSIS — Z23 Encounter for immunization: Secondary | ICD-10-CM

## 2016-07-06 DIAGNOSIS — Z79899 Other long term (current) drug therapy: Secondary | ICD-10-CM

## 2016-07-06 DIAGNOSIS — I158 Other secondary hypertension: Secondary | ICD-10-CM

## 2016-07-06 DIAGNOSIS — A630 Anogenital (venereal) warts: Secondary | ICD-10-CM

## 2016-07-06 DIAGNOSIS — Z113 Encounter for screening for infections with a predominantly sexual mode of transmission: Secondary | ICD-10-CM

## 2016-07-06 LAB — CBC
HCT: 51.7 % — ABNORMAL HIGH (ref 38.5–50.0)
HEMOGLOBIN: 17.6 g/dL — AB (ref 13.2–17.1)
MCH: 35.3 pg — AB (ref 27.0–33.0)
MCHC: 34 g/dL (ref 32.0–36.0)
MCV: 103.6 fL — AB (ref 80.0–100.0)
MPV: 10.4 fL (ref 7.5–12.5)
Platelets: 228 10*3/uL (ref 140–400)
RBC: 4.99 MIL/uL (ref 4.20–5.80)
RDW: 13.9 % (ref 11.0–15.0)
WBC: 8.7 10*3/uL (ref 3.8–10.8)

## 2016-07-06 LAB — COMPREHENSIVE METABOLIC PANEL
ALBUMIN: 4.4 g/dL (ref 3.6–5.1)
ALT: 17 U/L (ref 9–46)
AST: 19 U/L (ref 10–40)
Alkaline Phosphatase: 59 U/L (ref 40–115)
BUN: 10 mg/dL (ref 7–25)
CALCIUM: 9.3 mg/dL (ref 8.6–10.3)
CHLORIDE: 102 mmol/L (ref 98–110)
CO2: 24 mmol/L (ref 20–31)
Creat: 0.96 mg/dL (ref 0.60–1.35)
Glucose, Bld: 101 mg/dL — ABNORMAL HIGH (ref 65–99)
POTASSIUM: 4.9 mmol/L (ref 3.5–5.3)
Sodium: 138 mmol/L (ref 135–146)
TOTAL PROTEIN: 6.8 g/dL (ref 6.1–8.1)
Total Bilirubin: 0.4 mg/dL (ref 0.2–1.2)

## 2016-07-06 LAB — LIPID PANEL
CHOL/HDL RATIO: 3.7 ratio (ref <5.0)
CHOLESTEROL: 187 mg/dL (ref <200)
HDL: 50 mg/dL (ref >40)
LDL Cholesterol: 122 mg/dL — ABNORMAL HIGH (ref <100)
TRIGLYCERIDES: 77 mg/dL (ref <150)
VLDL: 15 mg/dL (ref <30)

## 2016-07-06 MED ORDER — DIPHENOXYLATE-ATROPINE 2.5-0.025 MG PO TABS: 1.0000 | ORAL_TABLET | Freq: Four times a day (QID) | ORAL | 3 refills | Status: DC | PRN

## 2016-07-06 MED ORDER — CROFELEMER 125 MG PO TBEC: 0.2500 mg | DELAYED_RELEASE_TABLET | Freq: Four times a day (QID) | ORAL | 3 refills | Status: DC | PRN

## 2016-07-06 NOTE — Assessment & Plan Note (Signed)
Will give him mening today He has gotten flu Will check labs today Will see him back in 6 months Offered/refused condoms.

## 2016-07-06 NOTE — Assessment & Plan Note (Signed)
Related to stress. Will continue to watch.  CP related to stress, panic?

## 2016-07-06 NOTE — Assessment & Plan Note (Signed)
Offered to have him back to [facility] for [place].  Would like to have done here. Will put him on list

## 2016-07-06 NOTE — Progress Notes (Signed)
   Subjective:    Patient ID: Marvin Macias, male    DOB: 1970/07/13, 46 y.o.   MRN: [id number]  HPI 46 yo M with HIV+, rectal warts, anxiety d/o. R radial fracture with ORIF October 2011. On atripla --> complera --> odefsy (07-2015). 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 Has had f/u wt [facility] as well (10-2014).  Has been doing ok. Still some work stress.  Had lots of diarrhea over the weekend, this has been more common with odefsy. Was prev taking imodium, states it was recalled.   HIV 1 RNA Quant (copies/mL)  Date Value  04/20/2016 84 (H)  04/07/2016 170 (H)  10/02/2015 <20   CD4 T Cell Abs (/uL)  Date Value  04/07/2016 860  10/02/2015 900  07/14/2015 890   Still occas episodes of CP, denies assoc sx.   Review of Systems  Constitutional: Negative for appetite change and unexpected weight change.  Respiratory: Negative for shortness of breath.   Cardiovascular: Positive for chest pain.  Gastrointestinal: Positive for diarrhea. Negative for constipation and nausea.  Genitourinary: Negative for difficulty urinating.       Objective:   Physical Exam  Constitutional: He appears well-developed and well-nourished.  HENT:  Mouth/Throat: No oropharyngeal exudate.  Eyes: EOM are normal. Pupils are equal, round, and reactive to light.  Neck: Neck supple.  Cardiovascular: Normal rate, regular rhythm and normal heart sounds.   Pulmonary/Chest: Effort normal and breath sounds normal.  Abdominal: Soft. Bowel sounds are normal. There is no tenderness. There is no rebound.  Musculoskeletal: He exhibits no edema.  Lymphadenopathy:    He has no cervical adenopathy.      Assessment & Plan:

## 2016-07-07 LAB — T-HELPER CELL (CD4) - (RCID CLINIC ONLY)
CD4 % Helper T Cell: 38 % (ref 33–55)
CD4 T Cell Abs: 720 /uL (ref 400–2700)

## 2016-07-07 LAB — URINE CYTOLOGY ANCILLARY ONLY
Chlamydia: NEGATIVE
Neisseria Gonorrhea: NEGATIVE

## 2016-07-07 LAB — RPR

## 2016-07-08 LAB — HIV-1 RNA ULTRAQUANT REFLEX TO GENTYP+: HIV 1 RNA Quant: <20 copies/mL (ref <20)

## 2016-07-12 ENCOUNTER — Telehealth: Payer: Self-pay

## 2016-07-12 MED ORDER — CROFELEMER 125 MG PO TBEC: 125.0000 mg | DELAYED_RELEASE_TABLET | Freq: Two times a day (BID) | ORAL | 3 refills | Status: DC

## 2016-07-12 NOTE — Telephone Encounter (Signed)
Script sent at last visit for [facility] dose is incorrect.(.25 mg twice daily.   Please give order for correct dose.   Per Dr Pamelia Hoit should be doses at 125 mg  twice daily . Medication dose changed on Medication list.   Laverle Patter, RN

## 2016-07-18 ENCOUNTER — Telehealth: Payer: Self-pay | Admitting: *Deleted

## 2016-07-18 NOTE — Telephone Encounter (Signed)
Patient called stating he woke up with a scratchy throat and runny nose. He bought some Mucinex and Tylenol and wanted to know if that was ok. Advised him that is good to take and he should get plenty of rest and drink a lot of fluid; water and juice.

## 2016-07-19 ENCOUNTER — Telehealth: Payer: Self-pay

## 2016-07-23 ENCOUNTER — Telehealth: Payer: Self-pay | Admitting: *Deleted

## 2016-07-23 NOTE — Telephone Encounter (Signed)
Patient left message on triage line, [facility] was closed due to weather.  Patient states "nothing is helping, I'm still spewing like a fountain." RN returned call Saturday, left message asking patient if he was still experiencing these symptoms, if he had been evaluated for infection or for dehydration, asked him to call back Monday with an update. [name], RN

## 2016-07-25 ENCOUNTER — Telehealth: Payer: Self-pay | Admitting: *Deleted

## 2016-07-25 NOTE — Telephone Encounter (Signed)
Patient requesting to be seen for potential flu, upper respiratory infection, states he needs to be seen today.  RN explained to patient that [facility] is closed due to water damage,  That we are closed to patients for the week. RN advised patient to be assessed at an urgent care. Patient upset that he cannot be seen today, that he will have to pay out of pocket for care.   [name], RN

## 2016-07-28 ENCOUNTER — Encounter: Payer: Self-pay | Admitting: Infectious Diseases

## 2016-09-27 ENCOUNTER — Other Ambulatory Visit: Payer: Self-pay | Admitting: Infectious Diseases

## 2016-09-27 DIAGNOSIS — R630 Anorexia: Secondary | ICD-10-CM

## 2016-09-28 ENCOUNTER — Other Ambulatory Visit: Payer: Self-pay | Admitting: *Deleted

## 2016-09-28 DIAGNOSIS — R630 Anorexia: Secondary | ICD-10-CM

## 2016-09-28 MED ORDER — DRONABINOL 5 MG PO CAPS: ORAL_CAPSULE | ORAL | 0 refills | Status: DC

## 2016-10-24 ENCOUNTER — Other Ambulatory Visit: Payer: Self-pay | Admitting: Infectious Diseases

## 2016-10-24 DIAGNOSIS — R630 Anorexia: Secondary | ICD-10-CM

## 2016-10-26 ENCOUNTER — Other Ambulatory Visit: Payer: Self-pay | Admitting: Infectious Diseases

## 2016-11-14 ENCOUNTER — Telehealth: Payer: Self-pay | Admitting: *Deleted

## 2016-11-14 ENCOUNTER — Other Ambulatory Visit: Payer: Self-pay | Admitting: Infectious Diseases

## 2016-11-14 DIAGNOSIS — B20 Human immunodeficiency virus [HIV] disease: Secondary | ICD-10-CM

## 2016-11-14 NOTE — Telephone Encounter (Signed)
Patient called c/o headache and elevated b/p of 155/105. He requested an appt with Dr. Johnnye Sima and the next available is June. Asked patient if he is drinking plenty of water and watching his diet, (salt, fried food, etc.). He said yes; he does not have a PCP and I offered to schedule him an appt with [facility] or [facility]. Because he lives in [place] he said he would have to go to a "doc in the box" or Urgent Care. Scheduled him an appt with Dr. Johnnye Sima for July with labs at the end of June. First available appt with a clinic MD is June also. Myrtis Hopping

## 2016-11-17 ENCOUNTER — Other Ambulatory Visit: Payer: Self-pay | Admitting: Infectious Diseases

## 2016-11-17 DIAGNOSIS — B029 Zoster without complications: Secondary | ICD-10-CM

## 2016-12-14 ENCOUNTER — Other Ambulatory Visit: Payer: Self-pay | Admitting: Infectious Diseases

## 2016-12-14 DIAGNOSIS — B029 Zoster without complications: Secondary | ICD-10-CM

## 2016-12-14 DIAGNOSIS — R197 Diarrhea, unspecified: Secondary | ICD-10-CM

## 2016-12-28 ENCOUNTER — Other Ambulatory Visit: Payer: Self-pay

## 2016-12-28 DIAGNOSIS — B20 Human immunodeficiency virus [HIV] disease: Secondary | ICD-10-CM

## 2016-12-28 LAB — COMPREHENSIVE METABOLIC PANEL
ALBUMIN: 4.2 g/dL (ref 3.6–5.1)
ALT: 28 U/L (ref 9–46)
AST: 24 U/L (ref 10–40)
Alkaline Phosphatase: 54 U/L (ref 40–115)
BUN: 10 mg/dL (ref 7–25)
CHLORIDE: 105 mmol/L (ref 98–110)
CO2: 26 mmol/L (ref 20–31)
Calcium: 9.2 mg/dL (ref 8.6–10.3)
Creat: 0.9 mg/dL (ref 0.60–1.35)
Glucose, Bld: 88 mg/dL (ref 65–99)
POTASSIUM: 4 mmol/L (ref 3.5–5.3)
Sodium: 139 mmol/L (ref 135–146)
TOTAL PROTEIN: 6.6 g/dL (ref 6.1–8.1)
Total Bilirubin: 0.7 mg/dL (ref 0.2–1.2)

## 2016-12-28 LAB — CBC WITH DIFFERENTIAL/PLATELET
BASOS ABS: 0 {cells}/uL (ref 0–200)
BASOS PCT: 0 %
EOS ABS: 75 {cells}/uL (ref 15–500)
Eosinophils Relative: 1 %
HEMATOCRIT: 46.4 % (ref 38.5–50.0)
HEMOGLOBIN: 15.5 g/dL (ref 13.2–17.1)
LYMPHS ABS: 2550 {cells}/uL (ref 850–3900)
Lymphocytes Relative: 34 %
MCH: 34.7 pg — AB (ref 27.0–33.0)
MCHC: 33.4 g/dL (ref 32.0–36.0)
MCV: 103.8 fL — AB (ref 80.0–100.0)
MONO ABS: 600 {cells}/uL (ref 200–950)
MPV: 10.4 fL (ref 7.5–12.5)
Monocytes Relative: 8 %
NEUTROS ABS: 4275 {cells}/uL (ref 1500–7800)
Neutrophils Relative %: 57 %
PLATELETS: 240 10*3/uL (ref 140–400)
RBC: 4.47 MIL/uL (ref 4.20–5.80)
RDW: 13.4 % (ref 11.0–15.0)
WBC: 7.5 10*3/uL (ref 3.8–10.8)

## 2016-12-28 LAB — T-HELPER CELL (CD4) - (RCID CLINIC ONLY)
CD4 % Helper T Cell: 35 % (ref 33–55)
CD4 T CELL ABS: 950 /uL (ref 400–2700)

## 2016-12-31 LAB — HIV-1 RNA QUANT-NO REFLEX-BLD
HIV 1 RNA Quant: <20 copies/mL
HIV-1 RNA Quant, Log: <1.3 Log copies/mL

## 2017-01-10 ENCOUNTER — Other Ambulatory Visit: Payer: Self-pay | Admitting: Infectious Diseases

## 2017-01-10 DIAGNOSIS — B029 Zoster without complications: Secondary | ICD-10-CM

## 2017-01-11 ENCOUNTER — Ambulatory Visit: Payer: Self-pay | Admitting: Infectious Diseases

## 2017-01-16 ENCOUNTER — Ambulatory Visit (INDEPENDENT_AMBULATORY_CARE_PROVIDER_SITE_OTHER): Payer: Self-pay | Admitting: Infectious Diseases

## 2017-01-16 ENCOUNTER — Ambulatory Visit: Payer: Self-pay

## 2017-01-16 ENCOUNTER — Other Ambulatory Visit: Payer: Self-pay | Admitting: Infectious Diseases

## 2017-01-16 ENCOUNTER — Encounter: Payer: Self-pay | Admitting: Infectious Diseases

## 2017-01-16 VITALS — BP 160/113 | HR 92 | Temp 98.7°F | Ht 67.0 in | Wt 150.0 lb

## 2017-01-16 DIAGNOSIS — B20 Human immunodeficiency virus [HIV] disease: Secondary | ICD-10-CM

## 2017-01-16 DIAGNOSIS — Z113 Encounter for screening for infections with a predominantly sexual mode of transmission: Secondary | ICD-10-CM

## 2017-01-16 DIAGNOSIS — R197 Diarrhea, unspecified: Secondary | ICD-10-CM

## 2017-01-16 NOTE — Assessment & Plan Note (Signed)
He is doing well Will send stool studies, he has been active. Need to check GC/Chlamydia as well.  If negative, onto GI.  Will see him back in 2 months.

## 2017-01-16 NOTE — Addendum Note (Signed)
Addended by: [name] on: 01/16/2017 03:22 PM   Modules accepted: Orders

## 2017-01-16 NOTE — Addendum Note (Signed)
Addended by: Dolan Amen D on: 01/16/2017 02:48 PM   Modules accepted: Orders

## 2017-01-16 NOTE — Addendum Note (Signed)
Addended by: Dolan Amen D on: 01/16/2017 03:29 PM   Modules accepted: Orders

## 2017-01-16 NOTE — Progress Notes (Signed)
   Subjective:    Patient ID: Marvin Macias, male    DOB: 10/03/1970, 46 y.o.   MRN: [id number]  HPI 46 yo M with HIV+, rectal warts, anxiety d/o. R radial fracture with ORIF October 2011. On atripla -->complera --> odefsy (07-2015). 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 Has had f/u wt [facility] as well (10-2014).   Has been waking up with diarrhea, incontinence. Has not had change in wt.  No fever or chills.  Soreness from wiping, no tenderness otherwise.  Sexually active.  Has been taking mytesi without improvement.   HIV 1 RNA Quant (copies/mL)  Date Value  12/28/2016 <20 NOT DETECTED  07/06/2016 <20  04/20/2016 84 (H)   CD4 T Cell Abs (/uL)  Date Value  12/28/2016 950  07/06/2016 720  04/07/2016 860    Review of Systems Please see HPI. 12 point ROS o/w (-)     Objective:   Physical Exam  Constitutional: He appears well-developed and well-nourished.  HENT:  Mouth/Throat: No oropharyngeal exudate.  Eyes: Pupils are equal, round, and reactive to light. EOM are normal.  Neck: Neck supple.  Cardiovascular: Normal rate, regular rhythm and normal heart sounds.   Pulmonary/Chest: Effort normal and breath sounds normal.  Abdominal: Soft. Bowel sounds are normal. There is no tenderness. There is no rebound.  Musculoskeletal: He exhibits no edema.  Lymphadenopathy:    He has no cervical adenopathy.  Psychiatric: He has a normal mood and affect.       Assessment & Plan:

## 2017-01-16 NOTE — Assessment & Plan Note (Signed)
Will check stool O & P, GC/Chlamydia, pathogens panel.

## 2017-01-17 LAB — OVA AND PARASITE EXAMINATION: OP: NONE SEEN

## 2017-01-17 LAB — CYTOLOGY, (ORAL, ANAL, URETHRAL) ANCILLARY ONLY
CHLAMYDIA, DNA PROBE: NEGATIVE
NEISSERIA GONORRHEA: NEGATIVE

## 2017-01-19 ENCOUNTER — Telehealth: Payer: Self-pay | Admitting: *Deleted

## 2017-01-19 NOTE — Telephone Encounter (Signed)
Should go to PCP or urgent care for BP check and med adjustment thanks

## 2017-01-19 NOTE — Telephone Encounter (Signed)
Patient called with concerns about elevated b/p. He stated that it was 132/102 and he is feeling lightheaded. It was high at the office visit on 01/16/17 at 160/113. He said that it has remained consistently high. I offered him a nurse visit to recheck it against his cuff; but he is not feeling well. Advised I would route this message to Dr. Johnnye Sima. Please advise Marvin Macias

## 2017-01-19 NOTE — Telephone Encounter (Signed)
Patient advised and offered to help him schedule with [facility]. Because he lives in [place], he is going to call [facility] and see what the wait time is for a new patient appointment. He will call back if he decides to be seen here in [place].

## 2017-01-20 LAB — GIARDIA/CRYPTOSPORIDIUM (EIA)

## 2017-01-26 ENCOUNTER — Encounter: Payer: Self-pay | Admitting: Infectious Diseases

## 2017-02-07 ENCOUNTER — Other Ambulatory Visit: Payer: Self-pay | Admitting: Infectious Diseases

## 2017-02-07 DIAGNOSIS — B20 Human immunodeficiency virus [HIV] disease: Secondary | ICD-10-CM

## 2017-02-07 DIAGNOSIS — R630 Anorexia: Secondary | ICD-10-CM

## 2017-02-07 DIAGNOSIS — R197 Diarrhea, unspecified: Secondary | ICD-10-CM

## 2017-02-21 NOTE — Telephone Encounter (Signed)
Error/tkk

## 2017-03-21 ENCOUNTER — Telehealth: Payer: Self-pay | Admitting: *Deleted

## 2017-03-21 ENCOUNTER — Encounter: Payer: Self-pay | Admitting: Infectious Diseases

## 2017-03-21 ENCOUNTER — Ambulatory Visit (INDEPENDENT_AMBULATORY_CARE_PROVIDER_SITE_OTHER): Payer: Self-pay | Admitting: Infectious Diseases

## 2017-03-21 VITALS — BP 132/89 | HR 97 | Temp 98.7°F

## 2017-03-21 DIAGNOSIS — F17200 Nicotine dependence, unspecified, uncomplicated: Secondary | ICD-10-CM

## 2017-03-21 DIAGNOSIS — B20 Human immunodeficiency virus [HIV] disease: Secondary | ICD-10-CM

## 2017-03-21 DIAGNOSIS — I10 Essential (primary) hypertension: Secondary | ICD-10-CM

## 2017-03-21 MED ORDER — LOSARTAN POTASSIUM-HCTZ 50-12.5 MG PO TABS: 1.0000 | ORAL_TABLET | Freq: Every day | ORAL | 10 refills | Status: DC

## 2017-03-21 MED ORDER — LOSARTAN POTASSIUM-HCTZ 50-12.5 MG PO TABS: 1.0000 | ORAL_TABLET | Freq: Every day | ORAL | 0 refills | Status: DC

## 2017-03-21 MED ORDER — VALSARTAN-HYDROCHLOROTHIAZIDE 80-12.5 MG PO TABS: 1.0000 | ORAL_TABLET | Freq: Every day | ORAL | 0 refills | Status: DC

## 2017-03-21 MED ORDER — VALSARTAN-HYDROCHLOROTHIAZIDE 80-12.5 MG PO TABS: 1.0000 | ORAL_TABLET | Freq: Every day | ORAL | 10 refills | Status: DC

## 2017-03-21 NOTE — Telephone Encounter (Signed)
Patient states he was given a prescription for lisinopril/hctz by another physician. He would like for Dr Johnnye Sima to write this prescription for him.  Patient's blood pressure is still not yet controlled (130's over 90's per patient).  He states he cannot get a refill from the other provider unless he is seen there, but he is charged $110 each visit. He states he cannot afford a primary care physician and lives too far away for one of the [facility] clinics for uninsured/underinsured patients. As today is his last pill, he would like to come here instead, discuss having Dr Johnnye Sima manage his blood pressure. RN stated that he would have to talk with Dr Johnnye Sima about primary care, but offered an appointment with Janene Madeira, FNP today at 3:00.  Patient accepted the appointment. [name], RN

## 2017-03-21 NOTE — Patient Instructions (Addendum)
Continue taking your blood pressure medication once a day.   Look at lowering the salt in your diet to help control your blood pressure. Think more about stopping smoking also.   Return to talk with Dr. Johnnye Sima at your next appointment.   Nice to meet you!   DASH Eating Plan DASH stands for "Dietary Approaches to Stop Hypertension." The DASH eating plan is a healthy eating plan that has been shown to reduce high blood pressure (hypertension). It may also reduce your risk for type 2 diabetes, heart disease, and stroke. The DASH eating plan may also help with weight loss. What are tips for following this plan? General guidelines  Avoid eating more than 2,300 mg (milligrams) of salt (sodium) a day. If you have hypertension, you may need to reduce your sodium intake to 1,500 mg a day.  Limit alcohol intake to no more than 1 drink a day for nonpregnant women and 2 drinks a day for men. One drink equals 12 oz of beer, 5 oz of wine, or 1 oz of hard liquor.  Work with your health care provider to maintain a healthy body weight or to lose weight. Ask what an ideal weight is for you.  Get at least 30 minutes of exercise that causes your heart to beat faster (aerobic exercise) most days of the week. Activities may include walking, swimming, or biking.  Work with your health care provider or diet and nutrition specialist (dietitian) to adjust your eating plan to your individual calorie needs. Reading food labels  Check food labels for the amount of sodium per serving. Choose foods with less than 5 percent of the Daily Value of sodium. Generally, foods with less than 300 mg of sodium per serving fit into this eating plan.  To find whole grains, look for the word "whole" as the first word in the ingredient list. Shopping  Buy products labeled as "low-sodium" or "no salt added."  Buy fresh foods. Avoid canned foods and premade or frozen meals. Cooking  Avoid adding salt when cooking. Use  salt-free seasonings or herbs instead of table salt or sea salt. Check with your health care provider or pharmacist before using salt substitutes.  Do not fry foods. Cook foods using healthy methods such as baking, boiling, grilling, and broiling instead.  Cook with heart-healthy oils, such as olive, canola, soybean, or sunflower oil. Meal planning   Eat a balanced diet that includes: ? 5 or more servings of fruits and vegetables each day. At each meal, try to fill half of your plate with fruits and vegetables. ? Up to 6-8 servings of whole grains each day. ? Less than 6 oz of lean meat, poultry, or fish each day. A 3-oz serving of meat is about the same size as a deck of cards. One egg equals 1 oz. ? 2 servings of low-fat dairy each day. ? A serving of nuts, seeds, or beans 5 times each week. ? Heart-healthy fats. Healthy fats called Omega-3 fatty acids are found in foods such as flaxseeds and coldwater fish, like sardines, salmon, and mackerel.  Limit how much you eat of the following: ? Canned or prepackaged foods. ? Food that is high in trans fat, such as fried foods. ? Food that is high in saturated fat, such as fatty meat. ? Sweets, desserts, sugary drinks, and other foods with added sugar. ? Full-fat dairy products.  Do not salt foods before eating.  Try to eat at least 2 vegetarian meals each week.  Eat more home-cooked food and less restaurant, buffet, and fast food.  When eating at a restaurant, ask that your food be prepared with less salt or no salt, if possible. What foods are recommended? The items listed may not be a complete list. Talk with your dietitian about what dietary choices are best for you. Grains Whole-grain or whole-wheat bread. Whole-grain or whole-wheat pasta. Brown rice. [place]. Bulgur. Whole-grain and low-sodium cereals. Pita bread. Low-fat, low-sodium crackers. Whole-wheat flour tortillas. Vegetables Fresh or frozen vegetables (raw, steamed,  roasted, or grilled). Low-sodium or reduced-sodium tomato and vegetable juice. Low-sodium or reduced-sodium tomato sauce and tomato paste. Low-sodium or reduced-sodium canned vegetables. Fruits All fresh, dried, or frozen fruit. Canned fruit in natural juice (without added sugar). Meat and other protein foods Skinless chicken or [place]. Ground chicken or [place]. Pork with fat trimmed off. Fish and seafood. Egg whites. Dried beans, peas, or lentils. Unsalted nuts, nut butters, and seeds. Unsalted canned beans. Lean cuts of beef with fat trimmed off. Low-sodium, lean deli meat. Dairy Low-fat (1%) or fat-free (skim) milk. Fat-free, low-fat, or reduced-fat cheeses. Nonfat, low-sodium ricotta or cottage cheese. Low-fat or nonfat yogurt. Low-fat, low-sodium cheese. Fats and oils Soft margarine without trans fats. Vegetable oil. Low-fat, reduced-fat, or light mayonnaise and salad dressings (reduced-sodium). Canola, safflower, olive, soybean, and sunflower oils. Avocado. Seasoning and other foods Herbs. Spices. Seasoning mixes without salt. Unsalted popcorn and pretzels. Fat-free sweets. What foods are not recommended? The items listed may not be a complete list. Talk with your dietitian about what dietary choices are best for you. Grains Baked goods made with fat, such as croissants, muffins, or some breads. Dry pasta or rice meal packs. Vegetables Creamed or fried vegetables. Vegetables in a cheese sauce. Regular canned vegetables (not low-sodium or reduced-sodium). Regular canned tomato sauce and paste (not low-sodium or reduced-sodium). Regular tomato and vegetable juice (not low-sodium or reduced-sodium). Angie Fava. Olives. Fruits Canned fruit in a light or heavy syrup. Fried fruit. Fruit in cream or butter sauce. Meat and other protein foods Fatty cuts of meat. Ribs. Fried meat. Berniece Salines. Sausage. Bologna and other processed lunch meats. Salami. Fatback. Hotdogs. Bratwurst. Salted nuts and seeds. Canned  beans with added salt. Canned or smoked fish. Whole eggs or egg yolks. Chicken or [place] with skin. Dairy Whole or 2% milk, cream, and half-and-half. Whole or full-fat cream cheese. Whole-fat or sweetened yogurt. Full-fat cheese. Nondairy creamers. Whipped toppings. Processed cheese and cheese spreads. Fats and oils Butter. Stick margarine. Lard. Shortening. Ghee. Bacon fat. Tropical oils, such as coconut, palm kernel, or palm oil. Seasoning and other foods Salted popcorn and pretzels. Onion salt, garlic salt, seasoned salt, table salt, and sea salt. Worcestershire sauce. Tartar sauce. Barbecue sauce. Teriyaki sauce. Soy sauce, including reduced-sodium. Steak sauce. Canned and packaged gravies. Fish sauce. Oyster sauce. Cocktail sauce. Horseradish that you find on the shelf. Ketchup. Mustard. Meat flavorings and tenderizers. Bouillon cubes. Hot sauce and Tabasco sauce. Premade or packaged marinades. Premade or packaged taco seasonings. Relishes. Regular salad dressings. Where to find more information:  National Heart, Lung, and [place]: https://wilson-eaton.com/  American Heart Association: www.heart.org Summary  The DASH eating plan is a healthy eating plan that has been shown to reduce high blood pressure (hypertension). It may also reduce your risk for type 2 diabetes, heart disease, and stroke.  With the DASH eating plan, you should limit salt (sodium) intake to 2,300 mg a day. If you have hypertension, you may need to reduce your sodium intake  to 1,500 mg a day.  When on the DASH eating plan, aim to eat more fresh fruits and vegetables, whole grains, lean proteins, low-fat dairy, and heart-healthy fats.  Work with your health care provider or diet and nutrition specialist (dietitian) to adjust your eating plan to your individual calorie needs. This information is not intended to replace advice given to you by your health care provider. Make sure you discuss any questions you have with your  health care provider. Document Released: 06/09/2011 Document Revised: 06/13/2016 Document Reviewed: 06/13/2016 Elsevier Interactive Patient Education  2017 [employer].

## 2017-03-21 NOTE — Assessment & Plan Note (Signed)
Smoking 1ppd currently. Counseled to quit smoking to help control BP without more meds. He failed chantix in the past as it increased depression. Encouraged him to think about options (Wellbutrin? Although I worry it will worsen his anxiety...).

## 2017-03-21 NOTE — Assessment & Plan Note (Signed)
Overall BP looks pretty good today - diastolic a bit over target. Losartan/HCTZ not on ADAP so will send in Valsartan/HCTZ 80/12.5 mg. Rx sent to local [facility] for pick up as he is out of medication and sent to specialty pharmacy per his request for future dispenses. Encouraged to look closely at his diet and decrease the sodium intake to help with BP control. Offered to check electrolytes today with his cramping as potassium may be a little low. He will follow up with this at his next visit in October with Dr. Johnnye Sima.

## 2017-03-21 NOTE — Progress Notes (Signed)
PCP - Patient, No Pcp Per    Patient's Medications  New Prescriptions   VALSARTAN-HYDROCHLOROTHIAZIDE (DIOVAN HCT) 80-12.5 MG TABLET    Take 1 tablet by mouth daily.   VALSARTAN-HYDROCHLOROTHIAZIDE (DIOVAN-HCT) 80-12.5 MG TABLET    Take 1 tablet by mouth daily.  Previous Medications   CLONAZEPAM (KLONOPIN) 0.5 MG TABLET    TAKE 1 TABLET BY MOUTH TWICE DAILY AS NEEDED   DIPHENOXYLATE-ATROPINE (LOMOTIL) 2.5-0.025 MG TABLET    Take 1 tablet by mouth 4 (four) times daily as needed for diarrhea or loose stools.   DRONABINOL (MARINOL) 5 MG CAPSULE    TAKE ONE CAPSULE BY MOUTH TWICE DAILY BEFORE MEALS   FLUTICASONE (FLONASE) 50 MCG/ACT NASAL SPRAY    Place 2 sprays into the nose daily.    MYTESI 125 MG TBEC    TAKE 1 TABLET BY MOUTH TWICE DAILY AT 10 AM AND AT 5 PM   ODEFSEY 200-25-25 MG TABS TABLET    TAKE 1 TABLET BY MOUTH DAILY WITH BREAKFAST   VALACYCLOVIR (VALTREX) 1000 MG TABLET    TAKE 1 TABLET BY MOUTH TWICE DAILY  Modified Medications   No medications on file  Discontinued Medications   LOSARTAN-HYDROCHLOROTHIAZIDE (HYZAAR) 50-12.5 MG TABLET    Take 1 tablet by mouth daily.    Subjective: Marvin Macias presents to clinic today for follow up on his hypertension. He is a HIV patient of [name].   HPI:  HIV =  Currently on a regimen of Odefsey and is tolerating it well. Has not missed any doses lately. VL 4m ago was undetectable and CD4 count adequate.   Hypertension = Went to urgent care and diagnosed with hypertension about 2 months ago and started on losartan/hctz 50/12.5mg  daily. Reports his BP was in the 150s/110s. Needs refill for this medication but no insurance outside of ADAP and not on formulary. Also does not have PCP and cannot afford to make appt with them. Reports he is doing well with this medication and has no side effects aside from some cramping in calves. Thinks the effects are not lasting throughout the day because he "feels as if it is elevated" in the  evenings. He is a smoker and eats a lot of processed / salt prepared foods (lunch meat, canned beans, pickles, etc).   Review of Systems  Constitutional: Negative for chills, fever and weight loss.  Respiratory: Negative for cough and shortness of breath.   Cardiovascular: Negative for chest pain, palpitations and leg swelling.  Gastrointestinal: Negative for abdominal pain, nausea and vomiting.  Neurological: Negative for dizziness and headaches.   Past Medical History:  Diagnosis Date  . Anxiety   . Depression   . HIV infection ([place])    Allergies  Allergen Reactions  . Codeine     REACTION: vomiting  . Erythromycin   . Hydrocodone-Acetaminophen   . Niacin     REACTION: swelling  . Penicillins     Objective:  Vitals:   03/21/17 1535 03/21/17 1538  BP: (!) 127/91 132/89  Pulse: 95 97  Temp: 98.7 F (37.1 C)   TempSrc: Oral    There is no height or weight on file to calculate BMI.  Physical Exam  Constitutional: He is oriented to person, place, and time and well-developed, well-nourished, and in no distress.  Cardiovascular: Normal rate, regular rhythm, normal heart sounds and intact distal pulses.   Pulmonary/Chest: Breath sounds normal. No respiratory distress. He has no wheezes.  Abdominal: Soft. Bowel sounds  are normal. He exhibits no distension.  Neurological: He is alert and oriented to person, place, and time.  Skin: Skin is warm and dry.  Psychiatric: Mood and affect normal.    Lab Results  Lab Results  Component Value Date   CREATININE 0.90 12/28/2016   BUN 10 12/28/2016   NA 139 12/28/2016   K 4.0 12/28/2016   CL 105 12/28/2016   CO2 26 12/28/2016   HIV 1 RNA Quant (copies/mL)  Date Value  12/28/2016 <20 NOT DETECTED  07/06/2016 <20  04/20/2016 84 (H)   CD4 T Cell Abs (/uL)  Date Value  12/28/2016 950  07/06/2016 720  04/07/2016 860    Problem List Items Addressed This Visit      Cardiovascular and Mediastinum   HTN (hypertension) -  Primary    Overall BP looks pretty good today - diastolic a bit over target. Losartan/HCTZ not on ADAP so will send in Valsartan/HCTZ 80/12.5 mg. Rx sent [facility] local [facility] up as he is out of medication and sent to specialty pharmacy per his request for future dispenses. Encouraged to look closely at his diet and decrease the sodium intake to help with BP control. Offered to check electrolytes today with his cramping as potassium may be a little low. He will follow up with this at his next visit in October with Dr. Johnnye Sima.       Relevant Medications   valsartan-hydrochlorothiazide (DIOVAN HCT) 80-12.5 MG tablet   valsartan-hydrochlorothiazide (DIOVAN-HCT) 80-12.5 MG tablet (Start on 04/20/2017)     Other   Human immunodeficiency virus (HIV) disease    Well controlled. No changes in regimen and will follow up in October for regularly scheduled visit.       TOBACCO USER    Smoking 1ppd currently. Counseled to quit smoking to help control BP without more meds. He failed chantix in the past as it increased depression. Encouraged him to think about options (Wellbutrin? Although I worry it will worsen his anxiety...).         I spent 25 minutes face to face with the patient 50% of which was dedicated to counseling re: sodium reduction, tobacco cessation, stress reduction and medications/side effects related to his hypertension.   Janene Madeira, MSN, NP-C [facility] [place] Pager: [phone]  03/21/17 4:59 PM

## 2017-03-21 NOTE — Assessment & Plan Note (Signed)
Well controlled. No changes in regimen and will follow up in October for regularly scheduled visit.

## 2017-03-23 NOTE — Telephone Encounter (Signed)
Happy to write this thanks

## 2017-03-27 ENCOUNTER — Other Ambulatory Visit: Payer: Self-pay

## 2017-03-27 DIAGNOSIS — R197 Diarrhea, unspecified: Secondary | ICD-10-CM

## 2017-03-27 MED ORDER — CROFELEMER 125 MG PO TBEC: DELAYED_RELEASE_TABLET | ORAL | 2 refills | Status: DC

## 2017-04-05 ENCOUNTER — Ambulatory Visit: Payer: Self-pay | Admitting: Infectious Diseases

## 2017-05-03 ENCOUNTER — Ambulatory Visit (INDEPENDENT_AMBULATORY_CARE_PROVIDER_SITE_OTHER): Payer: Self-pay | Admitting: Infectious Diseases

## 2017-05-03 ENCOUNTER — Encounter: Payer: Self-pay | Admitting: Infectious Diseases

## 2017-05-03 VITALS — BP 119/80 | HR 79 | Temp 98.5°F | Ht 67.0 in | Wt 145.0 lb

## 2017-05-03 DIAGNOSIS — F17200 Nicotine dependence, unspecified, uncomplicated: Secondary | ICD-10-CM

## 2017-05-03 DIAGNOSIS — Z23 Encounter for immunization: Secondary | ICD-10-CM

## 2017-05-03 DIAGNOSIS — Z113 Encounter for screening for infections with a predominantly sexual mode of transmission: Secondary | ICD-10-CM

## 2017-05-03 DIAGNOSIS — B20 Human immunodeficiency virus [HIV] disease: Secondary | ICD-10-CM

## 2017-05-03 DIAGNOSIS — Z79899 Other long term (current) drug therapy: Secondary | ICD-10-CM

## 2017-05-03 DIAGNOSIS — N529 Male erectile dysfunction, unspecified: Secondary | ICD-10-CM

## 2017-05-03 DIAGNOSIS — I158 Other secondary hypertension: Secondary | ICD-10-CM

## 2017-05-03 NOTE — Assessment & Plan Note (Signed)
Encouraged to quit.  He is "contemplative"

## 2017-05-03 NOTE — Progress Notes (Signed)
   Subjective:    Patient ID: Marvin Macias, male    DOB: [birth year], 46 y.o.   MRN: [id number]  HPI 46yo M with HIV+, rectal warts, anxiety d/o. R radial fracture with ORIF October 2011. On atripla -->complera -->odefsy (07-2015). 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 Has had f/u wt [facility] as well (10-2014).  Dx with HTN over the summer and started on ACE-HCTZ.  Has been feeling well. No problems with BP. Wt down 5#.   HIV 1 RNA Quant (copies/mL)  Date Value  12/28/2016 <20 NOT DETECTED  07/06/2016 <20  04/20/2016 84 (H)   CD4 T Cell Abs (/uL)  Date Value  12/28/2016 950  07/06/2016 720  04/07/2016 860     Review of Systems  Constitutional: Negative for appetite change and unexpected weight change.  Respiratory: Negative for cough and shortness of breath.   Gastrointestinal: Positive for diarrhea. Negative for constipation.  Genitourinary: Negative for difficulty urinating.  Psychiatric/Behavioral: Negative for dysphoric mood and sleep disturbance.  diarrhea unchanged, noted small amt of blood with wiping recently.  Please see HPI. All other systems reviewed and negative.     Objective:   Physical Exam  Constitutional: He appears well-developed and well-nourished.  HENT:  Mouth/Throat: No oropharyngeal exudate.  Eyes: Pupils are equal, round, and reactive to light. EOM are normal.  Neck: Neck supple.  Cardiovascular: Normal rate, regular rhythm and normal heart sounds.   Pulmonary/Chest: Effort normal and breath sounds normal.  Abdominal: Soft. Bowel sounds are normal. There is no tenderness. There is no rebound.  Musculoskeletal: He exhibits no edema.  Lymphadenopathy:    He has no cervical adenopathy.  Psychiatric: He has a normal mood and affect.       Assessment & Plan:

## 2017-05-03 NOTE — Assessment & Plan Note (Signed)
He is doing well Wishes to defer labs Flu shot today Offered/refused condoms.  rtc in 6 months.

## 2017-05-03 NOTE — Assessment & Plan Note (Signed)
He is doing well Improved.  No sx.

## 2017-05-03 NOTE — Assessment & Plan Note (Signed)
We discussed possible ER rx.  He wishes to defer to next visit.  He attributes to his HTN rx.

## 2017-05-15 ENCOUNTER — Other Ambulatory Visit: Payer: Self-pay | Admitting: Infectious Diseases

## 2017-05-15 DIAGNOSIS — R630 Anorexia: Secondary | ICD-10-CM

## 2017-05-23 ENCOUNTER — Telehealth: Payer: Self-pay | Admitting: *Deleted

## 2017-05-23 DIAGNOSIS — F5221 Male erectile disorder: Secondary | ICD-10-CM

## 2017-05-23 MED ORDER — SILDENAFIL CITRATE 25 MG PO TABS: 25.0000 mg | ORAL_TABLET | ORAL | 3 refills | Status: DC | PRN

## 2017-05-23 NOTE — Telephone Encounter (Signed)
He can have viagra 25mg  po q48h prn #10 3 refills He needs to go to ER or call MD if erection lasts > 6 hours Must use condoms

## 2017-05-23 NOTE — Telephone Encounter (Signed)
Patient notified, Rx printed and patient advised to contact Caryl Pina, patient assistance advocate.

## 2017-05-23 NOTE — Telephone Encounter (Signed)
Patient notified, Rx printed.

## 2017-05-23 NOTE — Telephone Encounter (Signed)
Patient called c/o side effects from his diovan/hctz and that he discussed this at his last office visit. Spoke to Pierce, Software engineer and he stated that unfortunately this is a side effect of many b/p meds. Advised patient that his b/p was very well controlled at his last two visits and that I would send a message to Dr. Johnnye Sima. Minh suggested that the patient may benefit from an ED medication. Patient asked if this is covered under ADAP and I told him although it is not, there is patient assistance if the MD approves it. Patient said this issue is very embarrassing to him. Please advise

## 2017-06-12 ENCOUNTER — Other Ambulatory Visit: Payer: Self-pay | Admitting: Infectious Diseases

## 2017-06-12 DIAGNOSIS — B029 Zoster without complications: Secondary | ICD-10-CM

## 2017-06-14 ENCOUNTER — Other Ambulatory Visit: Payer: Self-pay | Admitting: Infectious Diseases

## 2017-06-14 DIAGNOSIS — R197 Diarrhea, unspecified: Secondary | ICD-10-CM

## 2017-08-03 ENCOUNTER — Other Ambulatory Visit: Payer: Self-pay | Admitting: Infectious Diseases

## 2017-08-03 DIAGNOSIS — B20 Human immunodeficiency virus [HIV] disease: Secondary | ICD-10-CM

## 2017-08-25 ENCOUNTER — Other Ambulatory Visit: Payer: Self-pay | Admitting: Infectious Diseases

## 2017-08-25 DIAGNOSIS — R197 Diarrhea, unspecified: Secondary | ICD-10-CM

## 2017-08-28 ENCOUNTER — Ambulatory Visit: Payer: Self-pay

## 2017-08-28 ENCOUNTER — Encounter: Payer: Self-pay | Admitting: Infectious Diseases

## 2017-09-18 ENCOUNTER — Other Ambulatory Visit: Payer: Self-pay | Admitting: Infectious Diseases

## 2017-09-18 DIAGNOSIS — R630 Anorexia: Secondary | ICD-10-CM

## 2017-10-11 ENCOUNTER — Other Ambulatory Visit: Payer: Self-pay | Admitting: *Deleted

## 2017-10-11 DIAGNOSIS — I10 Essential (primary) hypertension: Secondary | ICD-10-CM

## 2017-10-11 MED ORDER — VALSARTAN-HYDROCHLOROTHIAZIDE 80-12.5 MG PO TABS: 1.0000 | ORAL_TABLET | Freq: Every day | ORAL | 10 refills | Status: DC

## 2017-10-11 NOTE — Progress Notes (Signed)
Sent refill to [facility] per patient request. [name], RN

## 2017-10-20 ENCOUNTER — Other Ambulatory Visit: Payer: Self-pay | Admitting: Infectious Diseases

## 2017-10-20 DIAGNOSIS — R197 Diarrhea, unspecified: Secondary | ICD-10-CM

## 2017-11-03 ENCOUNTER — Other Ambulatory Visit: Payer: Self-pay

## 2017-11-03 DIAGNOSIS — B20 Human immunodeficiency virus [HIV] disease: Secondary | ICD-10-CM

## 2017-11-03 DIAGNOSIS — Z79899 Other long term (current) drug therapy: Secondary | ICD-10-CM

## 2017-11-03 DIAGNOSIS — Z113 Encounter for screening for infections with a predominantly sexual mode of transmission: Secondary | ICD-10-CM

## 2017-11-03 LAB — COMPREHENSIVE METABOLIC PANEL
AG Ratio: 1.9 (calc) (ref 1.0–2.5)
ALBUMIN MSPROF: 4.4 g/dL (ref 3.6–5.1)
ALT: 17 U/L (ref 9–46)
AST: 20 U/L (ref 10–40)
Alkaline phosphatase (APISO): 46 U/L (ref 40–115)
BILIRUBIN TOTAL: 0.7 mg/dL (ref 0.2–1.2)
BUN: 14 mg/dL (ref 7–25)
CALCIUM: 9.8 mg/dL (ref 8.6–10.3)
CO2: 28 mmol/L (ref 20–32)
Chloride: 103 mmol/L (ref 98–110)
Creat: 1.09 mg/dL (ref 0.60–1.35)
Globulin: 2.3 g/dL (calc) (ref 1.9–3.7)
Glucose, Bld: 97 mg/dL (ref 65–99)
POTASSIUM: 4.6 mmol/L (ref 3.5–5.3)
SODIUM: 138 mmol/L (ref 135–146)
TOTAL PROTEIN: 6.7 g/dL (ref 6.1–8.1)

## 2017-11-03 LAB — LIPID PANEL
CHOL/HDL RATIO: 3.6 (calc) (ref <5.0)
CHOLESTEROL: 195 mg/dL (ref <200)
HDL: 54 mg/dL (ref >40)
LDL Cholesterol (Calc): 117 mg/dL (calc) — ABNORMAL HIGH
Non-HDL Cholesterol (Calc): 141 mg/dL (calc) — ABNORMAL HIGH (ref <130)
TRIGLYCERIDES: 127 mg/dL (ref <150)

## 2017-11-03 LAB — CBC
HEMATOCRIT: 45.9 % (ref 38.5–50.0)
HEMOGLOBIN: 16.6 g/dL (ref 13.2–17.1)
MCH: 36.7 pg — AB (ref 27.0–33.0)
MCHC: 36.2 g/dL — ABNORMAL HIGH (ref 32.0–36.0)
MCV: 101.5 fL — AB (ref 80.0–100.0)
MPV: 10.1 fL (ref 7.5–12.5)
Platelets: 259 10*3/uL (ref 140–400)
RBC: 4.52 10*6/uL (ref 4.20–5.80)
RDW: 12.7 % (ref 11.0–15.0)
WBC: 12.5 10*3/uL — ABNORMAL HIGH (ref 3.8–10.8)

## 2017-11-03 LAB — T-HELPER CELL (CD4) - (RCID CLINIC ONLY)
CD4 % Helper T Cell: 34 % (ref 33–55)
CD4 T CELL ABS: 920 /uL (ref 400–2700)

## 2017-11-06 ENCOUNTER — Other Ambulatory Visit: Payer: Self-pay

## 2017-11-06 LAB — URINE CYTOLOGY ANCILLARY ONLY
Chlamydia: NEGATIVE
Neisseria Gonorrhea: NEGATIVE

## 2017-11-06 LAB — RPR: RPR Ser Ql: NONREACTIVE

## 2017-11-08 ENCOUNTER — Telehealth: Payer: Self-pay | Admitting: Behavioral Health

## 2017-11-08 LAB — HIV-1 RNA QUANT-NO REFLEX-BLD
HIV 1 RNA Quant: 46 copies/mL — ABNORMAL HIGH
HIV-1 RNA QUANT, LOG: 1.66 {Log_copies}/mL — AB

## 2017-11-08 NOTE — Telephone Encounter (Signed)
Patient called this morning stating he woke up have extreme flank plain 10/10 and vomiting x4 since he woke up this morning.  Patient states, "I feel awful."  Writer advised him to go to urgent care or the Emergency room for further evaluation.  Patient was receptive and verbalized understanding.  Patient states he will call back to make Dr. Johnnye Macias aware of result. Pricilla Riffle RN

## 2017-11-15 ENCOUNTER — Other Ambulatory Visit: Payer: Self-pay | Admitting: Infectious Diseases

## 2017-11-15 DIAGNOSIS — R197 Diarrhea, unspecified: Secondary | ICD-10-CM

## 2017-11-15 DIAGNOSIS — B20 Human immunodeficiency virus [HIV] disease: Secondary | ICD-10-CM

## 2017-11-20 ENCOUNTER — Ambulatory Visit (INDEPENDENT_AMBULATORY_CARE_PROVIDER_SITE_OTHER): Payer: Self-pay | Admitting: Infectious Diseases

## 2017-11-20 ENCOUNTER — Encounter: Payer: Self-pay | Admitting: Infectious Diseases

## 2017-11-20 VITALS — BP 120/82 | HR 82 | Temp 99.3°F | Ht 67.0 in | Wt 144.0 lb

## 2017-11-20 DIAGNOSIS — Z113 Encounter for screening for infections with a predominantly sexual mode of transmission: Secondary | ICD-10-CM

## 2017-11-20 DIAGNOSIS — Z79899 Other long term (current) drug therapy: Secondary | ICD-10-CM

## 2017-11-20 DIAGNOSIS — F5221 Male erectile disorder: Secondary | ICD-10-CM

## 2017-11-20 DIAGNOSIS — F17200 Nicotine dependence, unspecified, uncomplicated: Secondary | ICD-10-CM

## 2017-11-20 DIAGNOSIS — I158 Other secondary hypertension: Secondary | ICD-10-CM

## 2017-11-20 DIAGNOSIS — A630 Anogenital (venereal) warts: Secondary | ICD-10-CM

## 2017-11-20 DIAGNOSIS — B20 Human immunodeficiency virus [HIV] disease: Secondary | ICD-10-CM

## 2017-11-20 MED ORDER — SILDENAFIL CITRATE 25 MG PO TABS: 25.0000 mg | ORAL_TABLET | ORAL | 3 refills | Status: DC | PRN

## 2017-11-20 NOTE — Progress Notes (Signed)
   Subjective:    Patient ID: Marvin Macias, male    DOB: 09-12-1970, 47 y.o.   MRN: [id number]  HPI 47yo M with HIV+, rectal warts, anxiety d/o. R radial fracture with ORIF October 2011. On atripla -->complera -->odefsy (07-2015). 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 Has had f/u wt [facility] as well (10-2014).  Dx with HTN 2018 and started on ACE-HCTZ. Was in ED 2 weeks ago, n/v, back pain. He had CT scan which no abn except for a 49mm RML nodule.  Still smoking 1ppd.  Has been having joint aches, hips/knees. No swelling. Takes no OTCs for this except CBD lotion, bath balm.   Mood has been "ok". Job is ok.    HIV 1 RNA Quant (copies/mL)  Date Value  11/03/2017 46 (H)  12/28/2016 <20 NOT DETECTED  07/06/2016 <20   CD4 T Cell Abs (/uL)  Date Value  11/03/2017 920  12/28/2016 950  07/06/2016 720    Review of Systems  Constitutional: Negative for appetite change, chills, fever and unexpected weight change.  Gastrointestinal: Negative for constipation, diarrhea, nausea and vomiting.  Genitourinary: Negative for difficulty urinating and genital sores.  Psychiatric/Behavioral: Negative for dysphoric mood.  Please see HPI. All other systems reviewed and negative.      Objective:   Physical Exam  Constitutional: He is oriented to person, place, and time. He appears well-developed and well-nourished.  HENT:  Head: Normocephalic and atraumatic.  Eyes: Pupils are equal, round, and reactive to light. EOM are normal.  Neck: Normal range of motion. Neck supple.  Cardiovascular: Normal rate, regular rhythm and normal heart sounds.  Pulmonary/Chest: Effort normal and breath sounds normal.  Abdominal: Soft. Bowel sounds are normal. He exhibits no distension. There is no tenderness.  Musculoskeletal: Normal range of motion. He exhibits no edema.  Neurological: He is alert and oriented to person, place, and time.  Psychiatric: He has a normal mood and affect.           Assessment & Plan:

## 2017-11-20 NOTE — Assessment & Plan Note (Signed)
Well controlled.  Doing well.

## 2017-11-20 NOTE — Assessment & Plan Note (Signed)
Quiescent

## 2017-11-20 NOTE — Assessment & Plan Note (Signed)
He is doing well Has blip. cd4 stable.  Offered/refused condoms.  Offered prevnar.  rtc in 9 months

## 2017-11-20 NOTE — Assessment & Plan Note (Signed)
Encouraged to quit. 

## 2017-12-13 ENCOUNTER — Other Ambulatory Visit: Payer: Self-pay | Admitting: Infectious Diseases

## 2017-12-13 DIAGNOSIS — R197 Diarrhea, unspecified: Secondary | ICD-10-CM

## 2017-12-13 DIAGNOSIS — B029 Zoster without complications: Secondary | ICD-10-CM

## 2017-12-13 DIAGNOSIS — B20 Human immunodeficiency virus [HIV] disease: Secondary | ICD-10-CM

## 2018-01-05 ENCOUNTER — Other Ambulatory Visit: Payer: Self-pay | Admitting: Infectious Diseases

## 2018-01-05 DIAGNOSIS — R630 Anorexia: Secondary | ICD-10-CM

## 2018-01-12 ENCOUNTER — Other Ambulatory Visit: Payer: Self-pay

## 2018-01-12 DIAGNOSIS — R630 Anorexia: Secondary | ICD-10-CM

## 2018-01-26 ENCOUNTER — Ambulatory Visit (INDEPENDENT_AMBULATORY_CARE_PROVIDER_SITE_OTHER): Payer: Self-pay

## 2018-01-29 ENCOUNTER — Encounter: Payer: Self-pay | Admitting: Infectious Diseases

## 2018-04-05 ENCOUNTER — Other Ambulatory Visit: Payer: Self-pay | Admitting: Infectious Diseases

## 2018-04-05 DIAGNOSIS — I10 Essential (primary) hypertension: Secondary | ICD-10-CM

## 2018-04-13 ENCOUNTER — Other Ambulatory Visit: Payer: Self-pay | Admitting: Behavioral Health

## 2018-04-13 DIAGNOSIS — I10 Essential (primary) hypertension: Secondary | ICD-10-CM

## 2018-04-13 MED ORDER — VALSARTAN-HYDROCHLOROTHIAZIDE 80-12.5 MG PO TABS: 1.0000 | ORAL_TABLET | Freq: Every day | ORAL | 3 refills | Status: DC

## 2018-04-17 ENCOUNTER — Ambulatory Visit (INDEPENDENT_AMBULATORY_CARE_PROVIDER_SITE_OTHER): Payer: Self-pay | Admitting: *Deleted

## 2018-04-17 DIAGNOSIS — Z23 Encounter for immunization: Secondary | ICD-10-CM

## 2018-04-17 DIAGNOSIS — B20 Human immunodeficiency virus [HIV] disease: Secondary | ICD-10-CM

## 2018-04-17 DIAGNOSIS — S4991XA Unspecified injury of right shoulder and upper arm, initial encounter: Secondary | ICD-10-CM

## 2018-04-17 NOTE — Progress Notes (Signed)
Patient reports right shoulder injury about 1 week ago, still with consistent pain. He was lifting furniture and "heard a pop." He states it "grinds and pops" when ever he moves it. He will go to urgent care if the pain worsens or if he develops any numbness/tingling in his arm or hand. Please advise if he should be referred for evaluation and where. [name], RN

## 2018-04-17 NOTE — Progress Notes (Signed)
I'm having a hard time finding it!  Did they change the name? I could only find Pediatric Sports Medicine.

## 2018-04-20 NOTE — Progress Notes (Signed)
Spoke with Threasa Beards at Sports Medicine.  Alok is scheduled for 10/23 3:00 Dr Barbaraann Barthel [place]. [phone] if he needs to reschedule, or if he has questions about charges.

## 2018-04-20 NOTE — Addendum Note (Signed)
Addended by: [name] on: 04/20/2018 10:54 AM   Modules accepted: Orders

## 2018-04-25 ENCOUNTER — Ambulatory Visit: Payer: Self-pay | Admitting: Family Medicine

## 2018-06-08 ENCOUNTER — Other Ambulatory Visit: Payer: Self-pay | Admitting: Infectious Diseases

## 2018-06-08 DIAGNOSIS — B20 Human immunodeficiency virus [HIV] disease: Secondary | ICD-10-CM

## 2018-06-08 DIAGNOSIS — B029 Zoster without complications: Secondary | ICD-10-CM

## 2018-06-08 DIAGNOSIS — R197 Diarrhea, unspecified: Secondary | ICD-10-CM

## 2018-06-08 DIAGNOSIS — R630 Anorexia: Secondary | ICD-10-CM

## 2018-08-06 ENCOUNTER — Ambulatory Visit (INDEPENDENT_AMBULATORY_CARE_PROVIDER_SITE_OTHER): Payer: Self-pay | Admitting: Infectious Diseases

## 2018-08-06 ENCOUNTER — Encounter: Payer: Self-pay | Admitting: Infectious Diseases

## 2018-08-06 VITALS — BP 124/87 | HR 88 | Temp 98.7°F | Ht 67.0 in | Wt 148.5 lb

## 2018-08-06 DIAGNOSIS — B20 Human immunodeficiency virus [HIV] disease: Secondary | ICD-10-CM

## 2018-08-06 DIAGNOSIS — R197 Diarrhea, unspecified: Secondary | ICD-10-CM

## 2018-08-06 DIAGNOSIS — I158 Other secondary hypertension: Secondary | ICD-10-CM

## 2018-08-06 DIAGNOSIS — F17200 Nicotine dependence, unspecified, uncomplicated: Secondary | ICD-10-CM

## 2018-08-06 NOTE — Progress Notes (Signed)
Name: Marvin Macias  DOB: 06/11/1971 MRN: [id number] PCP: Patient, No Pcp Per    Patient Active Problem List   Diagnosis Date Noted  . Impotence 05/03/2017  . HTN (hypertension) 04/20/2016  . HSV (herpes simplex virus) anogenital infection 07/18/2014  . Pyuria 02/03/2014  . Allergic sinusitis 12/09/2013  . Furuncle 09/09/2013  . Skin cancer, basal cell 04/16/2013  . TOBACCO USER 02/05/2007  . 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 DIARRHEA 10/11/2006  . Human immunodeficiency virus (HIV) disease 08/09/2006  . Condyloma 08/09/2006  . Anxiety state 08/09/2006  . DEPRESSION 08/09/2006  . INJURY NOS, SITE NOS 10/15/2004     Brief Narrative:  Marvin Macias is a 48 y.o. male with HIV disease, Dx 2008. History of OIs: none known. HIV RIsk: MSM   Previous Regimens: . Atripla  . Stribild  . Odefsey >> suppressed   Genotypes: . Sensitive   Subjective:  CC:  HIV follow up   HPI: Feeling well today and no complaints. He has been feeling well and no interval illnesses or hospitalizations since last OV. He has continue his BP medications and feels it is under good control. Looking for a new job and feels that he can come off BP meds if less stressful environment as it causes sexual dysfunction s/e that are unpleasant. He is still smoking but desires to quit.  Just turned 48 today - no colonoscopy yet.   Review of Systems  Constitutional: Negative for chills, fever, malaise/fatigue and weight loss.  HENT: Negative for sore throat.        No dental problems  Respiratory: Negative for cough and sputum production.   Cardiovascular: Negative for chest pain and leg swelling.  Gastrointestinal: Negative for abdominal pain, diarrhea and vomiting.  Genitourinary: Negative for dysuria and flank pain.  Musculoskeletal: Negative for joint pain, myalgias and neck pain.  Skin: Negative for rash.  Neurological: Negative for dizziness, tingling and headaches.  Psychiatric/Behavioral: Negative for  depression and substance abuse. The patient is not nervous/anxious and does not have insomnia.     Past Medical History:  Diagnosis Date  . Anxiety   . Depression   . HIV infection [facility])     Outpatient Medications Prior to Visit  Medication Sig Dispense Refill  . clonazePAM (KLONOPIN) 0.5 MG tablet TAKE 1 TABLET BY MOUTH TWICE DAILY AS NEEDED 60 tablet 3  . diphenoxylate-atropine (LOMOTIL) 2.5-0.025 MG tablet Take 1 tablet by mouth 4 (four) times daily as needed for diarrhea or loose stools. 30 tablet 3  . fluticasone (FLONASE) 50 MCG/ACT nasal spray Place 2 sprays into the nose daily.     Marland Kitchen MYTESI 125 MG TBEC TAKE 1 TABLET BY MOUTH TWICE DAILY AT 10 AM AND AT 5 PM 60 tablet 5  . ODEFSEY 200-25-25 MG TABS tablet TAKE 1 TABLET BY MOUTH DAILY WITH BREAKFAST 30 tablet 5  . sildenafil (VIAGRA) 25 MG tablet Take 1 tablet (25 mg total) by mouth every other day as needed for erectile dysfunction. 10 tablet 3  . valACYclovir (VALTREX) 1000 MG tablet TAKE 1 TABLET BY MOUTH TWICE DAILY 60 tablet 5  . valsartan-hydrochlorothiazide (DIOVAN-HCT) 80-12.5 MG tablet Take 1 tablet by mouth daily. 30 tablet 3  . dronabinol (MARINOL) 5 MG capsule TAKE ONE CAPSULE BY MOUTH TWICE DAILY (Patient not taking: Reported on 08/06/2018) 60 capsule 5   No facility-administered medications prior to visit.      Allergies  Allergen Reactions  . Codeine     REACTION: vomiting  .  Erythromycin   . Hydrocodone-Acetaminophen   . Niacin     REACTION: swelling  . Penicillins     Social History   Tobacco Use  . Smoking status: Current Every Day Smoker    Packs/day: 0.75    Years: 25.00    Pack years: 18.75    Types: Cigarettes  . Smokeless tobacco: Never Used  . Tobacco comment: cutting back  Substance Use Topics  . Alcohol use: Yes    Alcohol/week: 7.0 standard drinks    Types: 7 Standard drinks or equivalent per week    Comment: 2 glasses of wine a day  . Drug use: Yes    Frequency: 4.0 times per week     Types: Marijuana    Social History   Substance and Sexual Activity  Sexual Activity Yes  . Partners: Male   Comment: pt. given condoms     Objective:   Vitals:   08/06/18 1553  BP: 124/87  Pulse: 88  Temp: 98.7 F (37.1 C)  TempSrc: Oral  Weight: 148 lb 8 oz (67.4 kg)  Height: 5\' 7"  (1.702 m)   Body mass index is 23.26 kg/m.  Physical Exam Constitutional:      Appearance: He is well-developed.     Comments: Seated comfortably in chair during visit.   HENT:     Mouth/Throat:     Dentition: Normal dentition. No dental abscesses.  Cardiovascular:     Rate and Rhythm: Normal rate and regular rhythm.     Heart sounds: Normal heart sounds.  Pulmonary:     Effort: Pulmonary effort is normal.     Breath sounds: Normal breath sounds.  Abdominal:     General: There is no distension.     Palpations: Abdomen is soft.     Tenderness: There is no abdominal tenderness.  Lymphadenopathy:     Cervical: No cervical adenopathy.  Skin:    General: Skin is warm and dry.     Findings: No rash.  Neurological:     Mental Status: He is alert and oriented to person, place, and time.  Psychiatric:        Judgment: Judgment normal.     Comments: In good spirits today and engaged in care discussion.      Lab Results Lab Results  Component Value Date   WBC 12.5 (H) 11/03/2017   HGB 16.6 11/03/2017   HCT 45.9 11/03/2017   MCV 101.5 (H) 11/03/2017   PLT 259 11/03/2017    Lab Results  Component Value Date   CREATININE 1.09 11/03/2017   BUN 14 11/03/2017   NA 138 11/03/2017   K 4.6 11/03/2017   CL 103 11/03/2017   CO2 28 11/03/2017    Lab Results  Component Value Date   ALT 17 11/03/2017   AST 20 11/03/2017   ALKPHOS 54 12/28/2016   BILITOT 0.7 11/03/2017    Lab Results  Component Value Date   CHOL 195 11/03/2017   HDL 54 11/03/2017   LDLCALC 117 (H) 11/03/2017   TRIG 127 11/03/2017   CHOLHDL 3.6 11/03/2017   HIV 1 RNA Quant (copies/mL)  Date Value    11/03/2017 46 (H)  12/28/2016 <20 NOT DETECTED  07/06/2016 <20   CD4 T Cell Abs (/uL)  Date Value  11/03/2017 920  12/28/2016 950  07/06/2016 720     Assessment & Plan:   Problem List Items Addressed This Visit      Unprioritized   Human immunodeficiency virus (HIV) disease -  Primary    Has been very well controlled. Will continue [place] with food daily.  Needs to meet with Juliann Pulse today for ADAP re-enrollment. Discussed intervals again.  Declined condoms and STI testing.  Hep B immune, Hep C Ab negative (2017).  RTC in 1 year.       Relevant Orders   HIV-1 RNA quant-no reflex-bld   T-helper cell (CD4)- ([facility] only)   RPR   COMPLETE METABOLIC PANEL WITH GFR   CBC with Differential/Platelet   Lipid panel   PSA   CBC   T-helper cell (CD4)- ([facility] clinic only)   Comprehensive metabolic panel   Lipid panel   RPR   HIV-1 RNA quant-no reflex-bld   Urine cytology ancillary only   Urinalysis   TOBACCO USER    Discussed cessation today. Offered Wellbutrin. Information provided and will send MyChart to see if he wants to start this.       DIARRHEA    Some improvement with adding Mytesi BID - can continue this.       HTN (hypertension)    He would like to get off BP medications - discussed reducing dose once he is ready and finds a new job. Smoking cessation also discussed.          Janene Madeira, MSN, NP-C [facility] for Infectious [place] Pager: [phone] Office: [phone]  08/06/18  4:48 PM

## 2018-08-06 NOTE — Patient Instructions (Addendum)
Please continue your Odefsey every day with food.   Labs today and then you are good to come back in 1 year.   ADAP re-enrollment again in July of this year.   Buproprion (Wellbutrin) - look into this for smoking cessation and if you think this is right for you   Bupropion sustained-release tablets (smoking cessation) What is this medicine? BUPROPION (byoo PROE pee on) is used to help people quit smoking. This medicine may be used for other purposes; ask your health care provider or pharmacist if you have questions. COMMON BRAND NAME(S): Buproban, Zyban What should I tell my health care provider before I take this medicine? They need to know if you have any of these conditions: -an eating disorder, such as anorexia or bulimia -bipolar disorder or psychosis -diabetes or high blood sugar, treated with medication -glaucoma -head injury or brain tumor -heart disease, previous heart attack, or irregular heart beat -high blood pressure -kidney or liver disease -seizures -suicidal thoughts or a previous suicide attempt -Tourette's syndrome -weight loss -an unusual or allergic reaction to bupropion, other medicines, foods, dyes, or preservatives -breast-feeding -pregnant or trying to become pregnant How should I use this medicine? Take this medicine by mouth with a glass of water. Follow the directions on the prescription label. You can take it with or without food. If it upsets your stomach, take it with food. Do not cut, crush or chew this medicine. Take your medicine at regular intervals. If you take this medicine more than once a day, take your second dose at least 8 hours after you take your first dose. To limit difficulty in sleeping, avoid taking this medicine at bedtime. Do not take your medicine more often than directed. Do not stop taking this medicine suddenly except upon the advice of your doctor. Stopping this medicine too quickly may cause serious side effects. A special  MedGuide will be given to you by the pharmacist with each prescription and refill. Be sure to read this information carefully each time. Talk to your pediatrician regarding the use of this medicine in children. Special care may be needed. Overdosage: If you think you have taken too much of this medicine contact a poison control center or emergency room at once. NOTE: This medicine is only for you. Do not share this medicine with others. What if I miss a dose? If you miss a dose, skip the missed dose and take your next tablet at the regular time. There should be at least 8 hours between doses. Do not take double or extra doses. What may interact with this medicine? Do not take this medicine with any of the following medications: -linezolid -MAOIs like Azilect, Carbex, Eldepryl, Marplan, Nardil, and Parnate -methylene blue (injected into a vein) -other medicines that contain bupropion like Wellbutrin This medicine may also interact with the following medications: -alcohol -certain medicines for anxiety or sleep -certain medicines for blood pressure like metoprolol, propranolol -certain medicines for depression or psychotic disturbances -certain medicines for HIV or AIDS like efavirenz, lopinavir, nelfinavir, ritonavir -certain medicines for irregular heart beat like propafenone, flecainide -certain medicines for Parkinson's disease like amantadine, levodopa -certain medicines for seizures like carbamazepine, phenytoin, phenobarbital -cimetidine -clopidogrel -cyclophosphamide -digoxin -furazolidone -isoniazid -nicotine -orphenadrine -procarbazine -steroid medicines like prednisone or cortisone -stimulant medicines for attention disorders, weight loss, or to stay awake -tamoxifen -theophylline -thiotepa -ticlopidine -tramadol -warfarin This list may not describe all possible interactions. Give your health care provider a list of all the medicines, herbs, non-prescription drugs,  or  dietary supplements you use. Also tell them if you smoke, drink alcohol, or use illegal drugs. Some items may interact with your medicine. What should I watch for while using this medicine? Visit your doctor or health care professional for regular checks on your progress. This medicine should be used together with a patient support program. It is important to participate in a behavioral program, counseling, or other support program that is recommended by your health care professional. Patients and their families should watch out for new or worsening thoughts of suicide or depression. Also watch out for sudden changes in feelings such as feeling anxious, agitated, panicky, irritable, hostile, aggressive, impulsive, severely restless, overly excited and hyperactive, or not being able to sleep. If this happens, especially at the beginning of treatment or after a change in dose, call your health care professional. Avoid alcoholic drinks while taking this medicine. Drinking excessive alcoholic beverages, using sleeping or anxiety medicines, or quickly stopping the use of these agents while taking this medicine may increase your risk for a seizure. Do not drive or use heavy machinery until you know how this medicine affects you. This medicine can impair your ability to perform these tasks. Do not take this medicine close to bedtime. It may prevent you from sleeping. Your mouth may get dry. Chewing sugarless gum or sucking hard candy, and drinking plenty of water may help. Contact your doctor if the problem does not go away or is severe. Do not use nicotine patches or chewing gum without the advice of your doctor or health care professional while taking this medicine. You may need to have your blood pressure taken regularly if your doctor recommends that you use both nicotine and this medicine together. What side effects may I notice from receiving this medicine? Side effects that you should report to your doctor  or health care professional as soon as possible: -allergic reactions like skin rash, itching or hives, swelling of the face, lips, or tongue -breathing problems -changes in vision -confusion -elevated mood, decreased need for sleep, racing thoughts, impulsive behavior -fast or irregular heartbeat -hallucinations, loss of contact with reality -increased blood pressure -redness, blistering, peeling or loosening of the skin, including inside the mouth -seizures -suicidal thoughts or other mood changes -unusually weak or tired -vomiting Side effects that usually do not require medical attention (report to your doctor or health care professional if they continue or are bothersome): -constipation -headache -loss of appetite -nausea -tremors -weight loss This list may not describe all possible side effects. Call your doctor for medical advice about side effects. You may report side effects to FDA at 1-800-FDA-1088. Where should I keep my medicine? Keep out of the reach of children. Store at room temperature between 20 and 25 degrees C (68 and 77 degrees F). Protect from light. Keep container tightly closed. Throw away any unused medicine after the expiration date. NOTE: This sheet is a summary. It may not cover all possible information. If you have questions about this medicine, talk to your doctor, pharmacist, or health care provider.  2019 Elsevier/Gold Standard (2015-12-11 13:49:28)

## 2018-08-06 NOTE — Assessment & Plan Note (Signed)
Discussed cessation today. Offered Wellbutrin. Information provided and will send MyChart to see if he wants to start this.

## 2018-08-06 NOTE — Assessment & Plan Note (Addendum)
Has been very well controlled. Will continue [place] with food daily.  Needs to meet with Juliann Pulse today for ADAP re-enrollment. Discussed intervals again.  Declined condoms and STI testing.  Hep B immune, Hep C Ab negative (2017).  RTC in 1 year.

## 2018-08-06 NOTE — Assessment & Plan Note (Signed)
Some improvement with adding Mytesi BID - can continue this.

## 2018-08-06 NOTE — Assessment & Plan Note (Signed)
He would like to get off BP medications - discussed reducing dose once he is ready and finds a new job. Smoking cessation also discussed.

## 2018-08-08 LAB — COMPLETE METABOLIC PANEL WITH GFR
AG Ratio: 2 (calc) (ref 1.0–2.5)
ALBUMIN MSPROF: 4.5 g/dL (ref 3.6–5.1)
ALT: 19 U/L (ref 9–46)
AST: 20 U/L (ref 10–35)
Alkaline phosphatase (APISO): 55 U/L (ref 35–144)
BUN: 15 mg/dL (ref 7–25)
CALCIUM: 9.9 mg/dL (ref 8.6–10.3)
CO2: 28 mmol/L (ref 20–32)
CREATININE: 0.94 mg/dL (ref 0.70–1.33)
Chloride: 101 mmol/L (ref 98–110)
GFR, EST AFRICAN AMERICAN: 109 mL/min/{1.73_m2} (ref >=60)
GFR, EST NON AFRICAN AMERICAN: 94 mL/min/{1.73_m2} (ref >=60)
GLOBULIN: 2.3 g/dL (ref 1.9–3.7)
Glucose, Bld: 97 mg/dL (ref 65–99)
POTASSIUM: 4 mmol/L (ref 3.5–5.3)
SODIUM: 137 mmol/L (ref 135–146)
Total Bilirubin: 0.8 mg/dL (ref 0.2–1.2)
Total Protein: 6.8 g/dL (ref 6.1–8.1)

## 2018-08-08 LAB — CBC WITH DIFFERENTIAL/PLATELET
Absolute Monocytes: 940 cells/uL (ref 200–950)
Basophils Absolute: 65 cells/uL (ref 0–200)
Basophils Relative: 0.6 %
Eosinophils Absolute: 119 cells/uL (ref 15–500)
Eosinophils Relative: 1.1 %
HCT: 50.2 % — ABNORMAL HIGH (ref 38.5–50.0)
Hemoglobin: 18 g/dL — ABNORMAL HIGH (ref 13.2–17.1)
Lymphs Abs: 3208 cells/uL (ref 850–3900)
MCH: 36.8 pg — ABNORMAL HIGH (ref 27.0–33.0)
MCHC: 35.9 g/dL (ref 32.0–36.0)
MCV: 102.7 fL — ABNORMAL HIGH (ref 80.0–100.0)
MONOS PCT: 8.7 %
MPV: 10.6 fL (ref 7.5–12.5)
Neutro Abs: 6469 cells/uL (ref 1500–7800)
Neutrophils Relative %: 59.9 %
Platelets: 251 10*3/uL (ref 140–400)
RBC: 4.89 10*6/uL (ref 4.20–5.80)
RDW: 12.2 % (ref 11.0–15.0)
Total Lymphocyte: 29.7 %
WBC: 10.8 10*3/uL (ref 3.8–10.8)

## 2018-08-08 LAB — RPR: RPR Ser Ql: NONREACTIVE

## 2018-08-08 LAB — T-HELPER CELL (CD4) - (RCID CLINIC ONLY)
CD4 T CELL HELPER: 38 % (ref 33–55)
CD4 T Cell Abs: 1290 /uL (ref 400–2700)

## 2018-08-08 LAB — LIPID PANEL
CHOLESTEROL: 203 mg/dL — AB (ref <200)
HDL: 54 mg/dL (ref >=40)
LDL Cholesterol (Calc): 121 mg/dL (calc) — ABNORMAL HIGH
Non-HDL Cholesterol (Calc): 149 mg/dL (calc) — ABNORMAL HIGH (ref <130)
Total CHOL/HDL Ratio: 3.8 (calc) (ref <5.0)
Triglycerides: 162 mg/dL — ABNORMAL HIGH (ref <150)

## 2018-08-08 LAB — PSA: PSA: 0.6 ng/mL (ref <=4.0)

## 2018-08-08 LAB — HIV-1 RNA QUANT-NO REFLEX-BLD
HIV 1 RNA QUANT: 69 {copies}/mL — AB
HIV-1 RNA Quant, Log: 1.84 Log copies/mL — ABNORMAL HIGH

## 2018-08-09 ENCOUNTER — Other Ambulatory Visit: Payer: Self-pay | Admitting: Infectious Diseases

## 2018-08-09 ENCOUNTER — Encounter: Payer: Self-pay | Admitting: Infectious Diseases

## 2018-08-09 ENCOUNTER — Other Ambulatory Visit: Payer: Self-pay

## 2018-08-09 DIAGNOSIS — I10 Essential (primary) hypertension: Secondary | ICD-10-CM

## 2018-08-09 MED ORDER — VALSARTAN-HYDROCHLOROTHIAZIDE 80-12.5 MG PO TABS: 1.0000 | ORAL_TABLET | Freq: Every day | ORAL | 3 refills | Status: DC

## 2018-11-27 ENCOUNTER — Other Ambulatory Visit: Payer: Self-pay | Admitting: Infectious Diseases

## 2018-11-27 DIAGNOSIS — B029 Zoster without complications: Secondary | ICD-10-CM

## 2018-11-27 DIAGNOSIS — B20 Human immunodeficiency virus [HIV] disease: Secondary | ICD-10-CM

## 2018-11-27 DIAGNOSIS — R197 Diarrhea, unspecified: Secondary | ICD-10-CM

## 2018-11-27 DIAGNOSIS — I10 Essential (primary) hypertension: Secondary | ICD-10-CM

## 2019-01-07 ENCOUNTER — Other Ambulatory Visit: Payer: Self-pay

## 2019-01-07 ENCOUNTER — Ambulatory Visit: Payer: Self-pay

## 2019-02-07 ENCOUNTER — Encounter: Payer: Self-pay | Admitting: Infectious Diseases

## 2019-02-12 ENCOUNTER — Other Ambulatory Visit: Payer: Self-pay

## 2019-02-12 ENCOUNTER — Encounter: Payer: Self-pay | Admitting: Family

## 2019-02-12 ENCOUNTER — Ambulatory Visit: Payer: Self-pay | Admitting: Family

## 2019-02-12 DIAGNOSIS — H579 Unspecified disorder of eye and adnexa: Secondary | ICD-10-CM | POA: Insufficient documentation

## 2019-02-12 MED ORDER — DOXYCYCLINE HYCLATE 100 MG PO TABS: 100.0000 mg | ORAL_TABLET | Freq: Two times a day (BID) | ORAL | 0 refills | Status: DC

## 2019-02-12 NOTE — Progress Notes (Signed)
Subjective:    Patient ID: Marvin Macias, male    DOB: [birth year], 48 y.o.   MRN: [id number]  Chief Complaint  Patient presents with  . Follow-up    Stye on eye     HPI:  Marvin Macias is a 48 y.o. male with well controlled HIV disease presenting today for an acute office visit.  [name] has been experiencing a stye located in his upper left eye going on for about 2 weeks and has been refractory to over the counter stye drops as well as warm compresses. Has had previous stye in the past but not in this location and have previously responded to warm compresses.    Allergies  Allergen Reactions  . Codeine     REACTION: vomiting  . Erythromycin   . Hydrocodone-Acetaminophen   . Niacin     REACTION: swelling  . Penicillins       Outpatient Medications Prior to Visit  Medication Sig Dispense Refill  . clonazePAM (KLONOPIN) 0.5 MG tablet TAKE 1 TABLET BY MOUTH TWICE DAILY AS NEEDED 60 tablet 3  . diphenoxylate-atropine (LOMOTIL) 2.5-0.025 MG tablet Take 1 tablet by mouth 4 (four) times daily as needed for diarrhea or loose stools. 30 tablet 3  . dronabinol (MARINOL) 5 MG capsule TAKE ONE CAPSULE BY MOUTH TWICE DAILY 60 capsule 5  . fluticasone (FLONASE) 50 MCG/ACT nasal spray Place 2 sprays into the nose daily.     Marland Kitchen MYTESI 125 MG TBEC TAKE 1 TABLET BY MOUTH TWICE DAILY AT 10 AM AND AT 5 PM 60 tablet 5  . ODEFSEY 200-25-25 MG TABS tablet TAKE 1 TABLET BY MOUTH DAILY WITH BREAKFAST 30 tablet 5  . sildenafil (VIAGRA) 25 MG tablet Take 1 tablet (25 mg total) by mouth every other day as needed for erectile dysfunction. 10 tablet 3  . valACYclovir (VALTREX) 1000 MG tablet TAKE 1 TABLET BY MOUTH TWICE DAILY 60 tablet 5  . valsartan-hydrochlorothiazide (DIOVAN-HCT) 80-12.5 MG tablet TAKE 1 TABLET BY MOUTH DAILY 30 tablet 3   No facility-administered medications prior to visit.      Past Medical History:  Diagnosis Date  . Anxiety   . Depression   . HIV infection ([facility])       History reviewed. No pertinent surgical history.     Review of Systems  Constitutional: Negative for appetite change, chills, fatigue, fever and unexpected weight change.  Eyes: Positive for redness. Negative for visual disturbance.  Respiratory: Negative for cough, chest tightness, shortness of breath and wheezing.   Cardiovascular: Negative for chest pain and leg swelling.  Gastrointestinal: Negative for abdominal pain, constipation, diarrhea, nausea and vomiting.  Genitourinary: Negative for dysuria, flank pain, frequency, genital sores, hematuria and urgency.  Skin: Negative for rash.  Allergic/Immunologic: Negative for immunocompromised state.  Neurological: Negative for dizziness and headaches.      Objective:    BP 120/88   Pulse 86  Nursing note and vital signs reviewed.  Physical Exam Constitutional:      General: He is not in acute distress.    Appearance: He is well-developed.  Eyes:     Conjunctiva/sclera: Conjunctivae normal.     Comments: Red area located on the lateral left upper eyelid appears consistent with chalazion although is tender concerning for stye.  Neck:     Musculoskeletal: Neck supple.  Cardiovascular:     Rate and Rhythm: Normal rate and regular rhythm.     Heart sounds: Normal heart sounds. No murmur. No  friction rub. No gallop.   Pulmonary:     Effort: Pulmonary effort is normal. No respiratory distress.     Breath sounds: Normal breath sounds. No wheezing or rales.  Chest:     Chest wall: No tenderness.  Abdominal:     General: Bowel sounds are normal.     Palpations: Abdomen is soft.     Tenderness: There is no abdominal tenderness.  Lymphadenopathy:     Cervical: No cervical adenopathy.  Skin:    General: Skin is warm and dry.     Findings: No rash.  Neurological:     Mental Status: He is alert and oriented to person, place, and time.  Psychiatric:        Behavior: Behavior normal.        Thought Content: Thought content  normal.        Judgment: Judgment normal.      Depression screen [facility] 2/9 02/12/2019 08/06/2018 11/20/2017 05/03/2017 04/20/2016  Decreased Interest 0 0 0 0 0  Down, Depressed, Hopeless 0 0 0 0 0  PHQ - 2 Score 0 0 0 0 0  Altered sleeping - - - - -  Tired, decreased energy - - - - -  Change in appetite - - - - -  Feeling bad or failure about yourself  - - - - -  Trouble concentrating - - - - -  Moving slowly or fidgety/restless - - - - -  Suicidal thoughts - - - - -  PHQ-9 Score - - - - -       Assessment & Plan:   Problem List Items Addressed This Visit      Other   Eye lesion    Mr. Hollett appears to have a chalazion although cannot rule out stye based on location that has been refractory to OTC treatment. Will start doxycycline. If not improved will need to follow up with ophthalmology for further evaluation and treatment.           I am having Marvin Macias start on doxycycline. I am also having him maintain his fluticasone, clonazePAM, diphenoxylate-atropine, sildenafil, dronabinol, Odefsey, valACYclovir, Mytesi, and valsartan-hydrochlorothiazide.   Meds ordered this encounter  Medications  . doxycycline (VIBRA-TABS) 100 MG tablet    Sig: Take 1 tablet (100 mg total) by mouth 2 (two) times daily.    Dispense:  14 tablet    Refill:  0    Order Specific Question:   Supervising Provider    Answer:   Carlyle Basques [[id number]]     Follow-up: Return if symptoms worsen or fail to improve.   Terri Piedra, MSN, FNP-C Nurse Practitioner [facility] for Infectious Disease [place] number: [phone]

## 2019-02-12 NOTE — Assessment & Plan Note (Addendum)
Marvin Macias appears to have a chalazion although cannot rule out stye based on location that has been refractory to OTC treatment. Will start doxycycline. If not improved will need to follow up with ophthalmology for further evaluation and treatment.

## 2019-02-12 NOTE — Patient Instructions (Addendum)
Nice to see you.  We will send in doxycycline for your stye.  Please continue to provide warm compresses.   If your symptoms do not improve we may need to refer you to ophthalmology.   Have a great day and stay safe!

## 2019-02-26 DIAGNOSIS — H0014 Chalazion left upper eyelid: Secondary | ICD-10-CM

## 2019-03-19 ENCOUNTER — Other Ambulatory Visit: Payer: Self-pay | Admitting: Infectious Diseases

## 2019-03-19 DIAGNOSIS — I10 Essential (primary) hypertension: Secondary | ICD-10-CM

## 2019-04-02 ENCOUNTER — Ambulatory Visit (INDEPENDENT_AMBULATORY_CARE_PROVIDER_SITE_OTHER): Payer: Self-pay | Admitting: *Deleted

## 2019-04-02 ENCOUNTER — Other Ambulatory Visit: Payer: Self-pay

## 2019-04-02 DIAGNOSIS — Z23 Encounter for immunization: Secondary | ICD-10-CM

## 2019-06-06 DIAGNOSIS — H0014 Chalazion left upper eyelid: Secondary | ICD-10-CM

## 2019-06-06 NOTE — Telephone Encounter (Signed)
Marvin Macias, can you please help with this referral?  He has a recurrent stye with vision changes. Thank you! Sharyn Lull

## 2019-06-10 ENCOUNTER — Other Ambulatory Visit: Payer: Self-pay | Admitting: *Deleted

## 2019-06-10 DIAGNOSIS — B029 Zoster without complications: Secondary | ICD-10-CM

## 2019-06-10 DIAGNOSIS — R197 Diarrhea, unspecified: Secondary | ICD-10-CM

## 2019-06-10 DIAGNOSIS — B20 Human immunodeficiency virus [HIV] disease: Secondary | ICD-10-CM

## 2019-06-10 MED ORDER — VALACYCLOVIR HCL 1 G PO TABS: 1000.0000 mg | ORAL_TABLET | Freq: Two times a day (BID) | ORAL | 3 refills | Status: DC

## 2019-06-10 MED ORDER — MYTESI 125 MG PO TBEC: DELAYED_RELEASE_TABLET | ORAL | 3 refills | Status: DC

## 2019-06-10 MED ORDER — ODEFSEY 200-25-25 MG PO TABS: 1.0000 | ORAL_TABLET | Freq: Every day | ORAL | 3 refills | Status: DC

## 2019-07-15 ENCOUNTER — Telehealth: Payer: Self-pay | Admitting: *Deleted

## 2019-07-15 NOTE — Telephone Encounter (Signed)
Prior authorization received for patient's odefsey for 365 days, effective 07/15/19. Pharmacy notified. [name], RN

## 2019-07-16 ENCOUNTER — Other Ambulatory Visit: Payer: Self-pay | Admitting: Infectious Diseases

## 2019-07-16 DIAGNOSIS — I10 Essential (primary) hypertension: Secondary | ICD-10-CM

## 2019-07-29 ENCOUNTER — Other Ambulatory Visit: Payer: Self-pay

## 2019-07-29 ENCOUNTER — Ambulatory Visit: Payer: PRIVATE HEALTH INSURANCE

## 2019-08-09 ENCOUNTER — Other Ambulatory Visit: Payer: Self-pay

## 2019-08-10 ENCOUNTER — Other Ambulatory Visit: Payer: Self-pay | Admitting: Infectious Diseases

## 2019-08-10 DIAGNOSIS — I10 Essential (primary) hypertension: Secondary | ICD-10-CM

## 2019-08-12 ENCOUNTER — Encounter: Payer: Self-pay | Admitting: Infectious Diseases

## 2019-08-13 ENCOUNTER — Other Ambulatory Visit: Payer: Self-pay

## 2019-08-13 ENCOUNTER — Other Ambulatory Visit: Payer: Self-pay | Admitting: Infectious Diseases

## 2019-08-13 DIAGNOSIS — I10 Essential (primary) hypertension: Secondary | ICD-10-CM

## 2019-08-13 DIAGNOSIS — Z113 Encounter for screening for infections with a predominantly sexual mode of transmission: Secondary | ICD-10-CM

## 2019-08-13 DIAGNOSIS — B20 Human immunodeficiency virus [HIV] disease: Secondary | ICD-10-CM

## 2019-08-13 DIAGNOSIS — Z79899 Other long term (current) drug therapy: Secondary | ICD-10-CM

## 2019-08-13 MED ORDER — VALSARTAN-HYDROCHLOROTHIAZIDE 80-12.5 MG PO TABS: 1.0000 | ORAL_TABLET | Freq: Every day | ORAL | 3 refills | Status: DC

## 2019-08-14 LAB — URINE CYTOLOGY ANCILLARY ONLY
Chlamydia: NEGATIVE
Comment: NEGATIVE
Comment: NORMAL
Neisseria Gonorrhea: NEGATIVE

## 2019-08-14 LAB — T-HELPER CELL (CD4) - (RCID CLINIC ONLY)
CD4 % Helper T Cell: 36 % (ref 33–65)
CD4 T Cell Abs: 998 /uL (ref 400–1790)

## 2019-08-16 LAB — LIPID PANEL
Cholesterol: 198 mg/dL (ref <200)
HDL: 49 mg/dL (ref >=40)
LDL Cholesterol (Calc): 124 mg/dL (calc) — ABNORMAL HIGH
Non-HDL Cholesterol (Calc): 149 mg/dL (calc) — ABNORMAL HIGH (ref <130)
Total CHOL/HDL Ratio: 4 (calc) (ref <5.0)
Triglycerides: 131 mg/dL (ref <150)

## 2019-08-16 LAB — CBC WITH DIFFERENTIAL/PLATELET
Absolute Monocytes: 616 cells/uL (ref 200–950)
Basophils Absolute: 47 cells/uL (ref 0–200)
Basophils Relative: 0.6 %
Eosinophils Absolute: 79 cells/uL (ref 15–500)
Eosinophils Relative: 1 %
HCT: 48.3 % (ref 38.5–50.0)
Hemoglobin: 16.9 g/dL (ref 13.2–17.1)
Lymphs Abs: 2852 cells/uL (ref 850–3900)
MCH: 36.7 pg — ABNORMAL HIGH (ref 27.0–33.0)
MCHC: 35 g/dL (ref 32.0–36.0)
MCV: 105 fL — ABNORMAL HIGH (ref 80.0–100.0)
MPV: 10.7 fL (ref 7.5–12.5)
Monocytes Relative: 7.8 %
Neutro Abs: 4306 cells/uL (ref 1500–7800)
Neutrophils Relative %: 54.5 %
Platelets: 257 10*3/uL (ref 140–400)
RBC: 4.6 10*6/uL (ref 4.20–5.80)
RDW: 12.3 % (ref 11.0–15.0)
Total Lymphocyte: 36.1 %
WBC: 7.9 10*3/uL (ref 3.8–10.8)

## 2019-08-16 LAB — COMPREHENSIVE METABOLIC PANEL
AG Ratio: 1.9 (calc) (ref 1.0–2.5)
ALT: 15 U/L (ref 9–46)
AST: 18 U/L (ref 10–35)
Albumin: 4.4 g/dL (ref 3.6–5.1)
Alkaline phosphatase (APISO): 47 U/L (ref 35–144)
BUN: 10 mg/dL (ref 7–25)
CO2: 28 mmol/L (ref 20–32)
Calcium: 9.5 mg/dL (ref 8.6–10.3)
Chloride: 103 mmol/L (ref 98–110)
Creat: 0.9 mg/dL (ref 0.70–1.33)
Globulin: 2.3 g/dL (calc) (ref 1.9–3.7)
Glucose, Bld: 82 mg/dL (ref 65–99)
Potassium: 4.2 mmol/L (ref 3.5–5.3)
Sodium: 138 mmol/L (ref 135–146)
Total Bilirubin: 0.8 mg/dL (ref 0.2–1.2)
Total Protein: 6.7 g/dL (ref 6.1–8.1)

## 2019-08-16 LAB — HIV-1 RNA QUANT-NO REFLEX-BLD
HIV 1 RNA Quant: 26 copies/mL — ABNORMAL HIGH
HIV-1 RNA Quant, Log: 1.41 Log copies/mL — ABNORMAL HIGH

## 2019-08-16 LAB — RPR: RPR Ser Ql: NONREACTIVE

## 2019-08-22 ENCOUNTER — Other Ambulatory Visit: Payer: Self-pay

## 2019-08-23 ENCOUNTER — Encounter: Payer: Self-pay | Admitting: Infectious Diseases

## 2019-09-02 ENCOUNTER — Encounter: Payer: Self-pay | Admitting: Infectious Diseases

## 2019-09-09 ENCOUNTER — Telehealth: Payer: Self-pay

## 2019-09-09 NOTE — Telephone Encounter (Signed)
COVID-19 Pre-Screening Questions:09/09/19  Do you currently have a fever (>100 F), chills or unexplained body aches?NO  Are you currently experiencing new cough, shortness of breath, sore throat, runny nose?NO .  . Have you recently travelled outside the state of [place] in the last 14 days?NO .  Have you been in contact with someone that is currently pending confirmation of Covid19 testing or has been confirmed to have the Covid19 virus?  NO   **If the patient answers NO to ALL questions -  advise the patient to please call the clinic before coming to the office should any symptoms develop.     

## 2019-09-10 ENCOUNTER — Ambulatory Visit (HOSPITAL_COMMUNITY)
Admission: RE | Admit: 2019-09-10 | Discharge: 2019-09-10 | Disposition: A | Discharge status: Home / Self Care | Payer: PRIVATE HEALTH INSURANCE | Source: Ambulatory Visit | Attending: Infectious Diseases | Admitting: Infectious Diseases

## 2019-09-10 ENCOUNTER — Ambulatory Visit (INDEPENDENT_AMBULATORY_CARE_PROVIDER_SITE_OTHER): Payer: PRIVATE HEALTH INSURANCE | Admitting: Infectious Diseases

## 2019-09-10 ENCOUNTER — Encounter: Payer: Self-pay | Admitting: Infectious Diseases

## 2019-09-10 ENCOUNTER — Other Ambulatory Visit: Payer: Self-pay | Admitting: Infectious Diseases

## 2019-09-10 ENCOUNTER — Other Ambulatory Visit: Payer: Self-pay

## 2019-09-10 VITALS — BP 108/67 | HR 83 | Temp 98.0°F | Ht 67.0 in | Wt 141.0 lb

## 2019-09-10 DIAGNOSIS — Z113 Encounter for screening for infections with a predominantly sexual mode of transmission: Secondary | ICD-10-CM

## 2019-09-10 DIAGNOSIS — G8929 Other chronic pain: Secondary | ICD-10-CM

## 2019-09-10 DIAGNOSIS — M25511 Pain in right shoulder: Secondary | ICD-10-CM | POA: Insufficient documentation

## 2019-09-10 DIAGNOSIS — B20 Human immunodeficiency virus [HIV] disease: Secondary | ICD-10-CM | POA: Diagnosis not present

## 2019-09-10 DIAGNOSIS — Z79899 Other long term (current) drug therapy: Secondary | ICD-10-CM | POA: Diagnosis not present

## 2019-09-10 MED ORDER — ODEFSEY 200-25-25 MG PO TABS: 1.0000 | ORAL_TABLET | Freq: Every day | ORAL | 11 refills | Status: DC

## 2019-09-10 NOTE — Progress Notes (Signed)
Name: Marvin Macias  DOB: 25-Apr-1971 MRN: [id number] PCP: Patient, No Pcp Per    Patient Active Problem List   Diagnosis Date Noted  . Chronic right shoulder pain 09/23/2019  . HTN (hypertension) 04/20/2016  . Allergic sinusitis 12/09/2013  . Skin cancer, basal cell 04/16/2013  . TOBACCO USER 02/05/2007  . 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 DIARRHEA 10/11/2006  . Human immunodeficiency virus (HIV) disease ([place]) 08/09/2006  . Anxiety state 08/09/2006  . DEPRESSION 08/09/2006     Brief Narrative:  ABELARDO HARTSOCK is a 49 y.o. male with HIV disease, Dx 2008. History of OIs: none known. HIV RIsk: MSM   Previous Regimens: . Atripla  . Stribild  . Odefsey >> suppressed   Genotypes: . Sensitive   Subjective:  CC:  HIV follow up  R shoulder pain   HPI: He lost his father a short while ago. He is understandably having a hard time adjusting with the sudden loss. Continues on [place] one daily with food as instructed. Does not miss medication. No side effects and no drug interactions noted today.   Of most concern for him today physically his right shoulder has been off and on bothering him greatly. This has been ongoing for about 10 years (maybe more). No known mechanism of injury per say but feels it cracks and grinds a lot. Worsened by weather - can tell when it is going to rain. Nothing in particular has made it any better regarding OTC options for treatment. Requesting consideration of specialist once his insurance is sorted out.    Review of Systems  Constitutional: Negative for chills, fever, malaise/fatigue and weight loss.  HENT: Negative for sore throat.        No dental problems  Respiratory: Negative for cough and sputum production.   Cardiovascular: Negative for chest pain and leg swelling.  Gastrointestinal: Negative for abdominal pain, diarrhea and vomiting.  Genitourinary: Negative for dysuria and flank pain.  Musculoskeletal: Positive for joint pain. Negative for  myalgias and neck pain.  Skin: Negative for rash.  Neurological: Negative for dizziness, tingling and headaches.  Psychiatric/Behavioral: Negative for depression and substance abuse. The patient is not nervous/anxious and does not have insomnia.     Past Medical History:  Diagnosis Date  . Anxiety   . Depression   . HIV infection [facility])     Outpatient Medications Prior to Visit  Medication Sig Dispense Refill  . Crofelemer (MYTESI) 125 MG TBEC TAKE 1 TABLET BY MOUTH TWICE DAILY AT 10 AM AND AT 5 PM 60 tablet 3  . diphenoxylate-atropine (LOMOTIL) 2.5-0.025 MG tablet Take 1 tablet by mouth 4 (four) times daily as needed for diarrhea or loose stools. 30 tablet 3  . doxycycline (VIBRA-TABS) 100 MG tablet Take 1 tablet (100 mg total) by mouth 2 (two) times daily. 14 tablet 0  . valACYclovir (VALTREX) 1000 MG tablet Take 1 tablet (1,000 mg total) by mouth 2 (two) times daily. 60 tablet 3  . valsartan-hydrochlorothiazide (DIOVAN-HCT) 80-12.5 MG tablet Take 1 tablet by mouth daily. 30 tablet 3  . emtricitabine-rilpivir-tenofovir AF (ODEFSEY) 200-25-25 MG TABS tablet Take 1 tablet by mouth daily with breakfast. 30 tablet 3  . clonazePAM (KLONOPIN) 0.5 MG tablet TAKE 1 TABLET BY MOUTH TWICE DAILY AS NEEDED (Patient not taking: Reported on 09/10/2019) 60 tablet 3  . dronabinol (MARINOL) 5 MG capsule TAKE ONE CAPSULE BY MOUTH TWICE DAILY 60 capsule 5  . fluticasone (FLONASE) 50 MCG/ACT nasal spray Place 2 sprays into  the nose daily.     . sildenafil (VIAGRA) 25 MG tablet Take 1 tablet (25 mg total) by mouth every other day as needed for erectile dysfunction. (Patient not taking: Reported on 09/10/2019) 10 tablet 3   No facility-administered medications prior to visit.     Allergies  Allergen Reactions  . Codeine     REACTION: vomiting  . Erythromycin   . Hydrocodone-Acetaminophen   . Niacin     REACTION: swelling  . Penicillins     Social History   Tobacco Use  . Smoking status: Current  Every Day Smoker    Packs/day: 0.75    Years: 25.00    Pack years: 18.75    Types: Cigarettes  . Smokeless tobacco: Never Used  . Tobacco comment: cutting back  Substance Use Topics  . Alcohol use: Yes    Alcohol/week: 7.0 standard drinks    Types: 7 Standard drinks or equivalent per week    Comment: 2 glasses of wine a day  . Drug use: Yes    Frequency: 4.0 times per week    Types: Marijuana    Social History   Substance and Sexual Activity  Sexual Activity Yes  . Partners: Male   Comment: pt. given condoms     Objective:   Vitals:   09/10/19 0948  BP: 108/67  Pulse: 83  Temp: 98 F (36.7 C)  SpO2: 98%  Weight: 141 lb (64 kg)  Height: 5\' 7"  (1.702 m)   Body mass index is 22.08 kg/m.  Physical Exam Constitutional:      Appearance: He is well-developed.     Comments: Seated comfortably in chair during visit.   HENT:     Mouth/Throat:     Dentition: Normal dentition. No dental abscesses.  Cardiovascular:     Rate and Rhythm: Normal rate and regular rhythm.     Heart sounds: Normal heart sounds.  Pulmonary:     Effort: Pulmonary effort is normal.     Breath sounds: Normal breath sounds.  Abdominal:     General: There is no distension.     Palpations: Abdomen is soft.     Tenderness: There is no abdominal tenderness.  Musculoskeletal:     Right shoulder: Bony tenderness and crepitus present. No swelling. Decreased range of motion.  Lymphadenopathy:     Cervical: No cervical adenopathy.  Skin:    General: Skin is warm and dry.     Findings: No rash.  Neurological:     Mental Status: He is alert and oriented to person, place, and time.  Psychiatric:        Judgment: Judgment normal.     Comments: Tearful at times discussing his father      Lab Results Lab Results  Component Value Date   WBC 7.9 08/13/2019   HGB 16.9 08/13/2019   HCT 48.3 08/13/2019   MCV 105.0 (H) 08/13/2019   PLT 257 08/13/2019    Lab Results  Component Value Date    CREATININE 0.90 08/13/2019   BUN 10 08/13/2019   NA 138 08/13/2019   K 4.2 08/13/2019   CL 103 08/13/2019   CO2 28 08/13/2019    Lab Results  Component Value Date   ALT 15 08/13/2019   AST 18 08/13/2019   ALKPHOS 54 12/28/2016   BILITOT 0.8 08/13/2019    Lab Results  Component Value Date   CHOL 198 08/13/2019   HDL 49 08/13/2019   LDLCALC 124 (H) 08/13/2019   TRIG  131 08/13/2019   CHOLHDL 4.0 08/13/2019   HIV 1 RNA Quant (copies/mL)  Date Value  08/13/2019 26 (H)  08/06/2018 69 (H)  11/03/2017 46 (H)   CD4 T Cell Abs (/uL)  Date Value  08/13/2019 998  08/06/2018 1,290  11/03/2017 920     Assessment & Plan:   Problem List Items Addressed This Visit      Unprioritized   Human immunodeficiency virus (HIV) disease ([place]) - Primary (Chronic)    Under excellent control on Odefsey. Intermittent viral blips noted but CD4 in a good range.  Hep B immune, Hep C Ab negative 2017. Will consider re-screen at upcoming appointment.   RTC in 1 year with labs prior to visit.  COVID vaccination discussed and information provided. Otherwise up to date until Menveo booster due 2023.       Relevant Medications   emtricitabine-rilpivir-tenofovir AF (ODEFSEY) 200-25-25 MG TABS tablet   Other Relevant Orders   CBC   T-helper cell (CD4)- ([facility] clinic only)   Comprehensive metabolic panel   HIV-1 RNA quant-no reflex-bld   Chronic right shoulder pain    Crepitus and bony tenderness on exam with limited abduction ROM. Negative empty can. Suspect possible arthritis - will use topical diclofenac gel/cream OTC. [facility] at [facility] to be scheduled to assess joint capsule. Would like referral to specialist when he has the ability to do so - he will let me know.        Other Visit Diagnoses    Routine screening for STI (sexually transmitted infection)       Relevant Orders   RPR   Urine cytology ancillary only   High risk medication use       Relevant Orders   Lipid panel      Janene Madeira, MSN, NP-C [place] [facility] [place] Pager: [phone] Office: [phone]  09/23/19  3:46 PM

## 2019-09-10 NOTE — Patient Instructions (Addendum)
Please continue your Odefsey every day. This is working very well for you.   For your pain - can consider using Diclofenac cream (Volteren is the trade name). Ibuprofen by mouth is OK to use on occasion.   Please go to [facility] 1st floor to the radiology department to xray your shoulder. You don't need an appointment - can go as you are able.   I suspect you have arthritis in the shoulder given how long it has gone on now.   Please return in 1 year with labs prior to.

## 2019-09-11 ENCOUNTER — Other Ambulatory Visit: Payer: Self-pay

## 2019-09-23 DIAGNOSIS — G8929 Other chronic pain: Secondary | ICD-10-CM | POA: Insufficient documentation

## 2019-09-23 NOTE — Assessment & Plan Note (Signed)
Crepitus and bony tenderness on exam with limited abduction ROM. Negative empty can. Suspect possible arthritis - will use topical diclofenac gel/cream OTC. Xray at [facility] to be scheduled to assess joint capsule. Would like referral to specialist when he has the ability to do so - he will let me know.

## 2019-09-23 NOTE — Assessment & Plan Note (Signed)
Under excellent control on Odefsey. Intermittent viral blips noted but CD4 in a good range.  Hep B immune, Hep C Ab negative 2017. Will consider re-screen at upcoming appointment.   RTC in 1 year with labs prior to visit.  COVID vaccination discussed and information provided. Otherwise up to date until Menveo booster due 2023.

## 2019-09-27 ENCOUNTER — Other Ambulatory Visit: Payer: Self-pay | Admitting: Infectious Diseases

## 2019-09-27 ENCOUNTER — Telehealth: Payer: Self-pay

## 2019-09-27 DIAGNOSIS — B029 Zoster without complications: Secondary | ICD-10-CM

## 2019-09-27 DIAGNOSIS — R197 Diarrhea, unspecified: Secondary | ICD-10-CM

## 2019-09-27 NOTE — Telephone Encounter (Signed)
Received voicemail from [facility] stating they have been having issues contacting patient regarding refills. Requested alternative contact information. Spoke with patient who states he is doing fine on medication. Will follow up with pharmacy to refill medication,  Aundria Rud, [place]

## 2019-11-21 DIAGNOSIS — G8929 Other chronic pain: Secondary | ICD-10-CM

## 2019-11-21 DIAGNOSIS — M25512 Pain in left shoulder: Secondary | ICD-10-CM

## 2019-11-27 ENCOUNTER — Other Ambulatory Visit: Payer: Self-pay | Admitting: Infectious Diseases

## 2019-11-27 DIAGNOSIS — I10 Essential (primary) hypertension: Secondary | ICD-10-CM

## 2019-11-28 ENCOUNTER — Encounter: Payer: Self-pay | Admitting: Orthopaedic Surgery

## 2019-11-28 ENCOUNTER — Other Ambulatory Visit: Payer: Self-pay

## 2019-11-28 ENCOUNTER — Ambulatory Visit (INDEPENDENT_AMBULATORY_CARE_PROVIDER_SITE_OTHER): Payer: Worker's Compensation

## 2019-11-28 ENCOUNTER — Ambulatory Visit (INDEPENDENT_AMBULATORY_CARE_PROVIDER_SITE_OTHER): Payer: PRIVATE HEALTH INSURANCE | Admitting: Orthopaedic Surgery

## 2019-11-28 ENCOUNTER — Ambulatory Visit (INDEPENDENT_AMBULATORY_CARE_PROVIDER_SITE_OTHER): Payer: PRIVATE HEALTH INSURANCE

## 2019-11-28 VITALS — Ht 67.0 in | Wt 139.0 lb

## 2019-11-28 DIAGNOSIS — M792 Neuralgia and neuritis, unspecified: Secondary | ICD-10-CM

## 2019-11-28 DIAGNOSIS — M542 Cervicalgia: Secondary | ICD-10-CM

## 2019-11-28 MED ORDER — TRAMADOL HCL 50 MG PO TABS: 50.0000 mg | ORAL_TABLET | Freq: Four times a day (QID) | ORAL | 0 refills | Status: DC | PRN

## 2019-11-28 NOTE — Progress Notes (Signed)
Office Visit Note   Patient: Marvin Macias           Date of Birth: 19-Apr-1971           MRN: [id number] Visit Date: 11/28/2019              Requested by: [place] Callas, NP [place],  [place] [address] PCP: Patient, No Pcp Per   Assessment & Plan: Visit Diagnoses:  1. Radicular pain in left arm   2. Neck pain, bilateral   3. Pain of cervical spine   4. Radicular pain in right arm   5. Cervicalgia     Plan:  #1: MRI scan of the cervical spine #2: Soft cervical collar #3: Tramadol for pain #4: Can restart Advil as needed   Follow-Up Instructions: Return for follow up after MRI.   Orders:  Orders Placed This Encounter  Procedures  . XR Cervical Spine 2 or 3 views  . XR Shoulder Left  . MR Cervical Spine w/o contrast   Meds ordered this encounter  Medications  . traMADol (ULTRAM) 50 MG tablet    Sig: Take 1 tablet (50 mg total) by mouth every 6 (six) hours as needed.    Dispense:  30 tablet    Refill:  0    Order Specific Question:   Supervising Provider    Answer:   Garald Balding [id number]      Procedures: No procedures performed   Clinical Data: No additional findings.   Subjective: Chief Complaint  Patient presents with  . Neck - Pain   HPI Patient presents today for left arm pain for 2 weeks. 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 He had to carry a desk up stairs backwards and started hurting a couple days later. His pain is located in the right side of his neck and down his left arm. Numbness and tingling down his left arm and into his ulnar aspect of hand. He has also been having trouble sleeping. He has pain with range of motion of the cervical spine  moderate to severe  and his left arm feel weaker. He is left hand dominant. No previous shoulder or neck surgery. He was given a muscle relaxer to take at bedtime and just finished a round of prednisone. He states that those medications have helped. He  has also been to a chiropractor twice and is suppose to go back there again next week, but wanted an orthopedic opinion first.  He states that one of his clients is a Surveyor, minerals had evaluated him at the house and had been prescribed a 6-day taper Dosepak which certainly has helped him.    Review of Systems  All other systems reviewed and are negative.    Objective: Vital Signs: Ht 5\' 7"  (1.702 m)   Wt 139 lb (63 kg)   BMI 21.77 kg/m   Physical Exam Constitutional:      Appearance: Normal appearance. He is well-developed and normal weight.  HENT:     Head: Normocephalic.  Eyes:     Pupils: Pupils are equal, round, and reactive to light.  Pulmonary:     Effort: Pulmonary effort is normal.  Skin:    General: Skin is warm and dry.  Neurological:     Mental Status: He is alert and oriented to person, place, and time.  Psychiatric:        Behavior: Behavior normal.  Ortho Exam  Exam today reveals adequate chin to chest motion.  Backward extension does cause some pain but there is a slight limitation at that range.  Right and left rotation to about 6070 degrees.  He has good strength in both upper extremities.  Deep tendon reflexes were decreased in the triceps bilaterally and symmetric.  Otherwise 2-3+ noted.  Good grip strength bilaterally.  Specialty Comments:  No specialty comments available.  Imaging: XR Cervical Spine 2 or 3 views  Result Date: 11/28/2019 2 view x-ray of the cervical spine reveals to space narrowing C3-4 and C6-7.  The lordotic curve is completely straightened at this time.  XR Shoulder Left  Result Date: 11/28/2019 Three-view x-ray no occult pathology.  he does have a type II acromion.    PMFS History: Current Outpatient Medications  Medication Sig Dispense Refill  . clonazePAM (KLONOPIN) 0.5 MG tablet TAKE 1 TABLET BY MOUTH TWICE DAILY AS NEEDED 60 tablet 3  . diphenoxylate-atropine (LOMOTIL) 2.5-0.025 MG tablet Take 1 tablet by  mouth 4 (four) times daily as needed for diarrhea or loose stools. 30 tablet 3  . doxycycline (VIBRA-TABS) 100 MG tablet Take 1 tablet (100 mg total) by mouth 2 (two) times daily. 14 tablet 0  . dronabinol (MARINOL) 5 MG capsule TAKE ONE CAPSULE BY MOUTH TWICE DAILY 60 capsule 5  . emtricitabine-rilpivir-tenofovir AF (ODEFSEY) 200-25-25 MG TABS tablet Take 1 tablet by mouth daily with breakfast. 30 tablet 11  . fluticasone (FLONASE) 50 MCG/ACT nasal spray Place 2 sprays into the nose daily.     Marland Kitchen MYTESI 125 MG TBEC TAKE 1 TABLET BY MOUTH TWICE DAILY AT 10 AM AND AT 5 PM 60 tablet 3  . sildenafil (VIAGRA) 25 MG tablet Take 1 tablet (25 mg total) by mouth every other day as needed for erectile dysfunction. 10 tablet 3  . trimethoprim-polymyxin b (POLYTRIM) ophthalmic solution APPLY TO THE AFFECTED AREA THREE TIMES DAILY FOR 14 DAYS    . valACYclovir (VALTREX) 1000 MG tablet TAKE 1 TABLET(1000 MG) BY MOUTH TWICE DAILY 60 tablet 3  . valsartan-hydrochlorothiazide (DIOVAN-HCT) 80-12.5 MG tablet TAKE 1 TABLET BY MOUTH DAILY 30 tablet 3  . traMADol (ULTRAM) 50 MG tablet Take 1 tablet (50 mg total) by mouth every 6 (six) hours as needed. 30 tablet 0   No current facility-administered medications for this visit.    Patient Active Problem List   Diagnosis Date Noted  . Neck pain, bilateral 11/28/2019  . Radicular pain in right arm 11/28/2019  . Radicular pain in left arm 11/28/2019  . Pain of cervical spine 11/28/2019  . Chronic right shoulder pain 09/23/2019  . HTN (hypertension) 04/20/2016  . Allergic sinusitis 12/09/2013  . Skin cancer, basal cell 04/16/2013  . TOBACCO USER 02/05/2007  . 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 DIARRHEA 10/11/2006  . Human immunodeficiency virus (HIV) disease ([place]) 08/09/2006  . Anxiety state 08/09/2006  . DEPRESSION 08/09/2006   Past Medical History:  Diagnosis Date  . Anxiety   . Depression   . HIV infection ([place])     Family History  Problem Relation Age of  Onset  . Diabetes Father     History reviewed. No pertinent surgical history. Social History   Occupational History  . Not on file  Tobacco Use  . Smoking status: Current Every Day Smoker    Packs/day: 0.75    Years: 25.00    Pack years: 18.75    Types: Cigarettes  . Smokeless tobacco: Never Used  .  Tobacco comment: cutting back  Substance and Sexual Activity  . Alcohol use: Yes    Alcohol/week: 7.0 standard drinks    Types: 7 Standard drinks or equivalent per week    Comment: 2 glasses of wine a day  . Drug use: Yes    Frequency: 4.0 times per week    Types: Marijuana  . Sexual activity: Yes    Partners: Male    Comment: pt. given condoms

## 2019-12-03 ENCOUNTER — Encounter: Payer: Self-pay | Admitting: Orthopaedic Surgery

## 2019-12-10 ENCOUNTER — Encounter: Payer: Self-pay | Admitting: Orthopaedic Surgery

## 2019-12-10 ENCOUNTER — Telehealth: Payer: Self-pay

## 2019-12-10 NOTE — Telephone Encounter (Signed)
Taren with [facility] called stating that MRI appt.will be canceled until authorized.  Cb# [id number].  Please advise.

## 2019-12-11 ENCOUNTER — Other Ambulatory Visit: Payer: PRIVATE HEALTH INSURANCE

## 2019-12-31 ENCOUNTER — Other Ambulatory Visit: Payer: PRIVATE HEALTH INSURANCE

## 2020-01-15 ENCOUNTER — Ambulatory Visit
Admission: RE | Admit: 2020-01-15 | Discharge: 2020-01-15 | Disposition: A | Discharge status: Home / Self Care | Payer: PRIVATE HEALTH INSURANCE | Source: Ambulatory Visit | Attending: Orthopedic Surgery | Admitting: Orthopedic Surgery

## 2020-01-17 ENCOUNTER — Other Ambulatory Visit: Payer: Self-pay

## 2020-01-17 ENCOUNTER — Encounter: Payer: Self-pay | Admitting: Orthopaedic Surgery

## 2020-01-17 ENCOUNTER — Ambulatory Visit (INDEPENDENT_AMBULATORY_CARE_PROVIDER_SITE_OTHER): Payer: Worker's Compensation | Admitting: Orthopaedic Surgery

## 2020-01-17 DIAGNOSIS — M542 Cervicalgia: Secondary | ICD-10-CM

## 2020-01-17 NOTE — Progress Notes (Signed)
Office Visit Note   Patient: Marvin Macias           Date of Birth: Feb 03, 1971           MRN: [id number] Visit Date: 01/17/2020              Requested by: No referring provider defined for this encounter. PCP: Campbell Riches, MD   Assessment & Plan: Visit Diagnoses:  1. Pain of cervical spine   2. Neck pain, bilateral     Plan: [name] had an MRI scan of his cervical spine demonstrating a left eccentric disc bulge with uncovertebral and facet hypertrophy at C6-7 with resultant severe left C7 foraminal stenosis.  He also had reactive marrow edema about the right C2-3 facet which could serve as a source of neck pain and a right eccentric disc osteophyte at C3-4 with resultant mild spinal stenosis with mild right worse than left C4 foraminal narrowing.  [name] symptoms are on the left side and have been chronic.  He believes he has had a Medrol Dosepak and had been wearing a neck collar and is still having considerable pain to the point of compromise.  I think at this point is worth having a surgical evaluation and will refer him to Dr. Lorin Mercy  Follow-Up Instructions: Return Referred to Dr. Lorin Mercy.   Orders:  No orders of the defined types were placed in this encounter.  No orders of the defined types were placed in this encounter.     Procedures: No procedures performed   Clinical Data: No additional findings.   Subjective: Chief Complaint  Patient presents with  . Neck - Pain  Mr. Marvin Macias has had a several month history of left-sided neck pain with what appears to be radiculopathy of the left upper extremity.  He believes he has had a Medrol Dosepak which has not provided much relief he continues to wear neck collar.  He is at the point where he is really frustrated with the pain in his neck as well as his left arm.  MRI scan results are above  HPI  Review of Systems   Objective: Vital Signs: There were no vitals taken for this visit.  Physical  Exam Constitutional:      Appearance: He is well-developed.  Eyes:     Pupils: Pupils are equal, round, and reactive to light.  Pulmonary:     Effort: Pulmonary effort is normal.  Skin:    General: Skin is warm and dry.  Neurological:     Mental Status: He is alert and oriented to person, place, and time.  Psychiatric:        Behavior: Behavior normal.     Ortho Exam awake alert and oriented x3.  No pain with range of motion of his left shoulder.  Appears to have good grip and release.  I thought his wrist reflex was a little decreased compared to the right.  He has been wearing a neck collar but not sure that it is made much of a difference.  He is having some discomfort with range of motion of the cervical spine.  Motor exam intact  Specialty Comments:  No specialty comments available.  Imaging: No results found.   PMFS History: Patient Active Problem List   Diagnosis Date Noted  . Neck pain, bilateral 11/28/2019  . Radicular pain in right arm 11/28/2019  . Radicular pain in left arm 11/28/2019  . Pain of cervical spine 11/28/2019  . Chronic right  shoulder pain 09/23/2019  . HTN (hypertension) 04/20/2016  . Allergic sinusitis 12/09/2013  . Skin cancer, basal cell 04/16/2013  . TOBACCO USER 02/05/2007  . 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 DIARRHEA 10/11/2006  . Human immunodeficiency virus (HIV) disease ([place]) 08/09/2006  . Anxiety state 08/09/2006  . DEPRESSION 08/09/2006   Past Medical History:  Diagnosis Date  . Anxiety   . Depression   . HIV infection ([place])     Family History  Problem Relation Age of Onset  . Diabetes Father     History reviewed. No pertinent surgical history. Social History   Occupational History  . Not on file  Tobacco Use  . Smoking status: Current Every Day Smoker    Packs/day: 0.75    Years: 25.00    Pack years: 18.75    Types: Cigarettes  . Smokeless tobacco: Never Used  . Tobacco comment: cutting back  Substance and Sexual  Activity  . Alcohol use: Yes    Alcohol/week: 7.0 standard drinks    Types: 7 Standard drinks or equivalent per week    Comment: 2 glasses of wine a day  . Drug use: Yes    Frequency: 4.0 times per week    Types: Marijuana  . Sexual activity: Yes    Partners: Male    Comment: pt. given condoms     Garald Balding, MD   Note - This record has been created using [employer].  Chart creation errors have been sought, but may not always  have been located. Such creation errors do not reflect on  the standard of medical care.

## 2020-01-27 ENCOUNTER — Other Ambulatory Visit: Payer: Self-pay | Admitting: Infectious Diseases

## 2020-01-27 DIAGNOSIS — R197 Diarrhea, unspecified: Secondary | ICD-10-CM

## 2020-01-27 DIAGNOSIS — B029 Zoster without complications: Secondary | ICD-10-CM

## 2020-02-11 ENCOUNTER — Encounter: Payer: Self-pay | Admitting: Orthopaedic Surgery

## 2020-02-11 ENCOUNTER — Ambulatory Visit (INDEPENDENT_AMBULATORY_CARE_PROVIDER_SITE_OTHER): Payer: Worker's Compensation | Admitting: Orthopaedic Surgery

## 2020-02-11 DIAGNOSIS — M9971 Connective tissue and disc stenosis of intervertebral foramina of cervical region: Secondary | ICD-10-CM | POA: Diagnosis not present

## 2020-02-11 DIAGNOSIS — M4802 Spinal stenosis, cervical region: Secondary | ICD-10-CM

## 2020-02-11 MED ORDER — PREGABALIN 50 MG PO CAPS: 50.0000 mg | ORAL_CAPSULE | Freq: Three times a day (TID) | ORAL | 2 refills | Status: DC

## 2020-02-11 NOTE — Progress Notes (Signed)
Office Visit Note   Patient: Marvin Macias           Date of Birth: 11/11/1970           MRN: [id number] Visit Date: 02/11/2020              Requested by: Campbell Riches, MD [place],  [place] [id number] PCP: Campbell Riches, MD   Assessment & Plan: Visit Diagnoses:  1. Neural foraminal stenosis of cervical spine           With left C7 radiculopathy with significant weakness.  Plan: Patient's been through exercises wearing a collar all the time for many weeks.  Prednisone Dosepak really did not give him any relief.  He is use muscle relaxants, anti-inflammatories, tried tramadol to help the pain.  He is having problems at work problems sleeping.  Today we reviewed the MRI scan again copies of the report.  He has severe foraminal stenosis with disc protrusion and I would recommend single level cervical fusion with allograft and plate.  We discussed overnight stay in the hospital.  Questions were elicited and answered.  He will call if he decides he like to proceed.  Follow-Up Instructions: Return in about 4 weeks (around 03/10/2020).   Orders:  No orders of the defined types were placed in this encounter.  Meds ordered this encounter  Medications  . pregabalin (LYRICA) 50 MG capsule    Sig: Take 1 capsule (50 mg total) by mouth 3 (three) times daily.    Dispense:  90 capsule    Refill:  2      Procedures: No procedures performed   Clinical Data: No additional findings.   Subjective: Chief Complaint  Patient presents with  . Neck - Pain    HPI 49 year old male with several months history of severe neck pain that radiates into his left arm with weakness.  Dr. Durward Fortes had seen patient and referred him here for cervical surgery discussion.  He has noted weakness with pushing with his left arm, numbness in his hand primarily to middle digits.  He has been wearing a collar around his neck.  He has been using Flexeril at night which has been  helping him sleep.  He has tried tramadol that makes him nauseated.  When he turns his head or does lifting he has significant increased pain.  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  He is concerned about loss of function of the left arm with progressive weakness.  Has problems pushing on doors with his left arm none on the right he does not note any gait disturbance no history of fever or chills.  Review of Systems positive for hypertension depression anxiety HIV, compliant with all medications.  Does smoke.  14 point systems otherwise noncontributory.   Objective: Vital Signs: BP 113/79   Pulse 96   Ht 5\' 7"  (1.702 m)   Wt 145 lb (65.8 kg)   BMI 22.71 kg/m   Physical Exam Constitutional:      Appearance: He is well-developed.  HENT:     Head: Normocephalic and atraumatic.  Eyes:     Pupils: Pupils are equal, round, and reactive to light.  Neck:     Thyroid: No thyromegaly.     Trachea: No tracheal deviation.  Cardiovascular:     Rate and Rhythm: Normal  rate.  Pulmonary:     Effort: Pulmonary effort is normal.     Breath sounds: No wheezing.  Abdominal:     General: Bowel sounds are normal.     Palpations: Abdomen is soft.  Skin:    General: Skin is warm and dry.     Capillary Refill: Capillary refill takes less than 2 seconds.  Neurological:     Mental Status: He is alert and oriented to person, place, and time.  Psychiatric:        Behavior: Behavior normal.        Thought Content: Thought content normal.        Judgment: Judgment normal.     Ortho Exam patient has severe brachial plexus tenderness on the left negative on the right.  Absent triceps reflex on the left 2+ on the right.  Biceps brachioradialis is intact.  He has 3+ out of 5 left triceps, left wrist flexion 4 out of 5 finger extension 4 out of 5.  Profound Isamah interossei are all 5 out  of 5 biceps is normal right and left.  Normal heel toe gait no lower extremity clonus or hyperreflexia.  Decreased sensation long finger left hand all other digits are normal.  Specialty Comments:  No specialty comments available.  Imaging: CLINICAL DATA:  Initial evaluation for chronic neck pain with left upper extremity pain.  EXAM: MRI CERVICAL SPINE WITHOUT CONTRAST  TECHNIQUE: Multiplanar, multisequence MR imaging of the cervical spine was performed. No intravenous contrast was administered.  COMPARISON:  Prior radiograph from 11/28/2019  FINDINGS: Alignment: Mild straightening of the normal cervical lordosis. No listhesis.  Vertebrae: Vertebral body height maintained without evidence for acute or chronic fracture. Bone marrow signal intensity mildly heterogeneous but within normal limits. No discrete or worrisome osseous lesions. Reactive marrow edema seen about the right C2-3 facet due to facet arthritis (series 4, image 2). No other abnormal marrow edema.  Cord: Signal intensity within the cervical spinal cord is within normal limits. Slight prominence of the central canal noted at the level of C2.  Posterior Fossa, vertebral arteries, paraspinal tissues: Visualized brain and posterior fossa within normal limits. Craniocervical junction normal. Paraspinous and prevertebral soft tissues within normal limits. Normal intravascular flow voids seen within the vertebral arteries bilaterally.  Disc levels:  C2-C3: Mild right eccentric disc bulge with uncovertebral hypertrophy. Moderate right-sided facet degeneration. Resultant mild right C3 foraminal stenosis. Central canal left neural foramen remain widely patent.  C3-C4: Chronic intervertebral disc space narrowing with uncovertebral and endplate osseous ridging. Broad-based right paracentral disc osteophyte flattens the right ventral thecal sac with resultant mild spinal stenosis. Mild right worse than  left C4 foraminal stenosis.  C4-C5: Minimal uncovertebral hypertrophy without significant disc bulge. No canal or foraminal stenosis.  C5-C6: Mild disc bulge with uncovertebral hypertrophy. Left-sided facet degeneration. No spinal stenosis. Mild left C6 foraminal narrowing.  C6-C7: Left eccentric disc bulge with bilateral uncovertebral hypertrophy. Superimposed bilateral facet degeneration. Mild flattening of the ventral thecal sac without significant spinal stenosis. Severe left C7 foraminal stenosis. Right neural foramina remains patent.  C7-T1: Mild facet hypertrophy. Otherwise unremarkable. No stenosis.  Visualized upper thoracic spine demonstrates no significant finding.  IMPRESSION: 1. Left eccentric disc bulge with uncovertebral and facet hypertrophy at C6-7 with resultant severe left C7 foraminal stenosis. 2. Right eccentric disc osteophyte at C3-4 with resultant mild spinal stenosis, with mild right worse than left C4 foraminal narrowing. 3. Reactive marrow edema about the right C2-3 facet due to facet  arthritis. Finding could serve as a source for neck pain.   Electronically Signed   By: Jeannine Boga M.D.   On: 01/16/2020 05:04   PMFS History: Patient Active Problem List   Diagnosis Date Noted  . Neural foraminal stenosis of cervical spine 02/13/2020  . Neck pain, bilateral 11/28/2019  . Radicular pain in right arm 11/28/2019  . Radicular pain in left arm 11/28/2019  . Pain of cervical spine 11/28/2019  . Chronic right shoulder pain 09/23/2019  . HTN (hypertension) 04/20/2016  . Allergic sinusitis 12/09/2013  . Skin cancer, basal cell 04/16/2013  . TOBACCO USER 02/05/2007  . 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 DIARRHEA 10/11/2006  . Human immunodeficiency virus (HIV) disease ([place]) 08/09/2006  . Anxiety state 08/09/2006  . DEPRESSION 08/09/2006   Past Medical History:  Diagnosis Date  . Anxiety   . Depression   . HIV infection ([place])      Family History  Problem Relation Age of Onset  . Diabetes Father     No past surgical history on file. Social History   Occupational History  . Not on file  Tobacco Use  . Smoking status: Current Every Day Smoker    Packs/day: 0.75    Years: 25.00    Pack years: 18.75    Types: Cigarettes  . Smokeless tobacco: Never Used  . Tobacco comment: cutting back  Substance and Sexual Activity  . Alcohol use: Yes    Alcohol/week: 7.0 standard drinks    Types: 7 Standard drinks or equivalent per week    Comment: 2 glasses of wine a day  . Drug use: Yes    Frequency: 4.0 times per week    Types: Marijuana  . Sexual activity: Yes    Partners: Male    Comment: pt. given condoms

## 2020-02-12 ENCOUNTER — Telehealth: Payer: Self-pay | Admitting: Orthopaedic Surgery

## 2020-02-12 NOTE — Telephone Encounter (Signed)
Please advise 

## 2020-02-12 NOTE — Telephone Encounter (Signed)
I called discussed once again . He wants to proceed with scheduling .

## 2020-02-12 NOTE — Telephone Encounter (Signed)
Pt would like to know how long the surgery will keep him out of work?   [phone]

## 2020-02-13 DIAGNOSIS — M4802 Spinal stenosis, cervical region: Secondary | ICD-10-CM | POA: Insufficient documentation

## 2020-02-17 ENCOUNTER — Encounter: Payer: Self-pay | Admitting: Orthopaedic Surgery

## 2020-02-27 NOTE — Telephone Encounter (Signed)
Can you please advise on patient's surgery?

## 2020-02-28 MED ORDER — GABAPENTIN 100 MG PO CAPS
100.0000 mg | ORAL_CAPSULE | Freq: Two times a day (BID) | ORAL | 0 refills | Status: DC
Start: 2020-02-28 — End: 2020-03-27

## 2020-03-02 ENCOUNTER — Encounter: Payer: Self-pay | Admitting: Orthopaedic Surgery

## 2020-03-13 NOTE — Telephone Encounter (Signed)
Is there a form or medical clearance that is needed? thanks

## 2020-03-17 ENCOUNTER — Encounter: Payer: Self-pay | Admitting: Orthopaedic Surgery

## 2020-03-17 ENCOUNTER — Ambulatory Visit (INDEPENDENT_AMBULATORY_CARE_PROVIDER_SITE_OTHER): Payer: Worker's Compensation | Admitting: Orthopaedic Surgery

## 2020-03-17 VITALS — Ht 67.0 in | Wt 145.0 lb

## 2020-03-17 DIAGNOSIS — M5020 Other cervical disc displacement, unspecified cervical region: Secondary | ICD-10-CM

## 2020-03-17 DIAGNOSIS — M4802 Spinal stenosis, cervical region: Secondary | ICD-10-CM

## 2020-03-17 DIAGNOSIS — M9971 Connective tissue and disc stenosis of intervertebral foramina of cervical region: Secondary | ICD-10-CM

## 2020-03-18 DIAGNOSIS — M5020 Other cervical disc displacement, unspecified cervical region: Secondary | ICD-10-CM | POA: Insufficient documentation

## 2020-03-18 NOTE — Progress Notes (Signed)
Office Visit Note   Patient: Marvin Macias           Date of Birth: Jun 02, 1971           MRN: [id number] Visit Date: 03/17/2020              Requested by: Campbell Riches, MD [place],  [place] [address] PCP: Campbell Riches, MD   Assessment & Plan: Visit Diagnoses:  1. Neural foraminal stenosis of cervical spine   2. Protrusion of cervical intervertebral disc     Plan: Persistent radiculopathy left C7 with C6-7 disc bulge and severe foraminal stenosis.  Patient's been through therapy, Medrol Dosepak, medication treatment with muscle relaxants, chronic use of a collar without relief.  Symptoms been present for several months and patient was referred to me by Dr. Joni Fears for surgical intervention at C6-7 level.  Long discussion with patient on operative technique, risk surgery including dysphagia dysphonia overnight stay use of a collar.  He could resume work as soon as he is off pain medication in a few weeks but would do best if he wears the collar to help immobilize the neck and aid in healing of the single level fusion.  Risks of surgery discussed patient's anxious but states pain is been so severe he is now ready to proceed with operative intervention.  We will schedule him once insurance authorization is approved.  Follow-Up Instructions: No follow-ups on file.   Orders:  No orders of the defined types were placed in this encounter.  No orders of the defined types were placed in this encounter.     Procedures: No procedures performed   Clinical Data: No additional findings.   Subjective: Chief Complaint  Patient presents with  . Neck - Pain, Follow-up    HPI 49 year old male returns with ongoing problems with the severe neck pain left arm pain.  He is wearing his collar all the time.  He states he has trouble sleeping he has avoided lifting anything more than 10 pounds.  He has several questions about surgery is concerned about the  length of time he be out of work and wants to minimize the time out of work so he can pay all his bills.  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  He has noted progressive loss of strength in his left arm and has severe foraminal stenosis on the left at C5-6.  Patient had symptoms for several months states he originally injured it at work but did not report it for many weeks and then it was too late.  He rates his pain is ongoing severe present for several months and has been through conservative treatment including stretching program prednisone Dosepak muscle relaxants, chronic use of a collar.  Persistent left arm weakness and numbness.  Pain wakes him up at night he cannot get comfortable.  Review of Systems review of system positive for anxiety depression HIV compliant on medications, smoker otherwise noncontributory.   Objective: Vital Signs: Ht 5\' 7"  (1.702 m)   Wt 145 lb (65.8 kg)   BMI 22.71 kg/m   Physical Exam Constitutional:      Appearance: He is well-developed.  HENT:     Head: Normocephalic and atraumatic.  Eyes:     Pupils: Pupils are equal, round, and reactive to light.  Neck:     Thyroid: No thyromegaly.     Trachea: No tracheal deviation.  Cardiovascular:  Rate and Rhythm: Normal rate.  Pulmonary:     Effort: Pulmonary effort is normal.     Breath sounds: No wheezing.  Abdominal:     General: Bowel sounds are normal.     Palpations: Abdomen is soft.  Skin:    General: Skin is warm and dry.     Capillary Refill: Capillary refill takes less than 2 seconds.  Neurological:     Mental Status: He is alert and oriented to person, place, and time.  Psychiatric:        Behavior: Behavior normal.        Thought Content: Thought content normal.        Judgment: Judgment normal.     Ortho Exam Patient has a very brachial plexus on the left.  Absent triceps reflex.  Triceps is weak at 3+ out of 5 with less wrist flexion 4- out of 5.  Slight weakness with finger  extension 4 out of 5.  Good wrist flexion and finger flexion no interosseous weakness.  Biceps is strong.  Opposite right arm shows normal reflex normal strength.  No long track signs no clonus normal heel toe gait.  Specialty Comments:  No specialty comments available.  Imaging: CLINICAL DATA:  Initial evaluation for chronic neck pain with left upper extremity pain.  EXAM: MRI CERVICAL SPINE WITHOUT CONTRAST  TECHNIQUE: Multiplanar, multisequence MR imaging of the cervical spine was performed. No intravenous contrast was administered.  COMPARISON:  Prior radiograph from 11/28/2019  FINDINGS: Alignment: Mild straightening of the normal cervical lordosis. No listhesis.  Vertebrae: Vertebral body height maintained without evidence for acute or chronic fracture. Bone marrow signal intensity mildly heterogeneous but within normal limits. No discrete or worrisome osseous lesions. Reactive marrow edema seen about the right C2-3 facet due to facet arthritis (series 4, image 2). No other abnormal marrow edema.  Cord: Signal intensity within the cervical spinal cord is within normal limits. Slight prominence of the central canal noted at the level of C2.  Posterior Fossa, vertebral arteries, paraspinal tissues: Visualized brain and posterior fossa within normal limits. Craniocervical junction normal. Paraspinous and prevertebral soft tissues within normal limits. Normal intravascular flow voids seen within the vertebral arteries bilaterally.  Disc levels:  C2-C3: Mild right eccentric disc bulge with uncovertebral hypertrophy. Moderate right-sided facet degeneration. Resultant mild right C3 foraminal stenosis. Central canal left neural foramen remain widely patent.  C3-C4: Chronic intervertebral disc space narrowing with uncovertebral and endplate osseous ridging. Broad-based right paracentral disc osteophyte flattens the right ventral thecal sac with resultant mild  spinal stenosis. Mild right worse than left C4 foraminal stenosis.  C4-C5: Minimal uncovertebral hypertrophy without significant disc bulge. No canal or foraminal stenosis.  C5-C6: Mild disc bulge with uncovertebral hypertrophy. Left-sided facet degeneration. No spinal stenosis. Mild left C6 foraminal narrowing.  C6-C7: Left eccentric disc bulge with bilateral uncovertebral hypertrophy. Superimposed bilateral facet degeneration. Mild flattening of the ventral thecal sac without significant spinal stenosis. Severe left C7 foraminal stenosis. Right neural foramina remains patent.  C7-T1: Mild facet hypertrophy. Otherwise unremarkable. No stenosis.  Visualized upper thoracic spine demonstrates no significant finding.  IMPRESSION: 1. Left eccentric disc bulge with uncovertebral and facet hypertrophy at C6-7 with resultant severe left C7 foraminal stenosis. 2. Right eccentric disc osteophyte at C3-4 with resultant mild spinal stenosis, with mild right worse than left C4 foraminal narrowing. 3. Reactive marrow edema about the right C2-3 facet due to facet arthritis. Finding could serve as a source for neck pain.  Electronically Signed   By: Jeannine Boga M.D.   On: 01/16/2020 05:04   PMFS History: Patient Active Problem List   Diagnosis Date Noted  . Protrusion of cervical intervertebral disc 03/18/2020  . Neural foraminal stenosis of cervical spine 02/13/2020  . Neck pain, bilateral 11/28/2019  . Radicular pain in right arm 11/28/2019  . Radicular pain in left arm 11/28/2019  . Pain of cervical spine 11/28/2019  . Chronic right shoulder pain 09/23/2019  . HTN (hypertension) 04/20/2016  . Allergic sinusitis 12/09/2013  . Skin cancer, basal cell 04/16/2013  . TOBACCO USER 02/05/2007  . 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 DIARRHEA 10/11/2006  . Human immunodeficiency virus (HIV) disease ([place]) 08/09/2006  . Anxiety state 08/09/2006  . DEPRESSION 08/09/2006    Past Medical History:  Diagnosis Date  . Anxiety   . Depression   . HIV infection ([place])     Family History  Problem Relation Age of Onset  . Diabetes Father     No past surgical history on file. Social History   Occupational History  . Not on file  Tobacco Use  . Smoking status: Current Every Day Smoker    Packs/day: 0.75    Years: 25.00    Pack years: 18.75    Types: Cigarettes  . Smokeless tobacco: Never Used  . Tobacco comment: cutting back  Substance and Sexual Activity  . Alcohol use: Yes    Alcohol/week: 7.0 standard drinks    Types: 7 Standard drinks or equivalent per week    Comment: 2 glasses of wine a day  . Drug use: Yes    Frequency: 4.0 times per week    Types: Marijuana  . Sexual activity: Yes    Partners: Male    Comment: pt. given condoms

## 2020-03-26 ENCOUNTER — Other Ambulatory Visit: Payer: Self-pay | Admitting: Infectious Diseases

## 2020-03-26 DIAGNOSIS — I10 Essential (primary) hypertension: Secondary | ICD-10-CM

## 2020-03-27 ENCOUNTER — Other Ambulatory Visit: Payer: Self-pay | Admitting: Orthopaedic Surgery

## 2020-03-27 ENCOUNTER — Telehealth: Payer: Self-pay | Admitting: Orthopaedic Surgery

## 2020-03-27 NOTE — Telephone Encounter (Signed)
Please advise. OK for refill? 

## 2020-03-27 NOTE — Telephone Encounter (Signed)
Please advise 

## 2020-03-27 NOTE — Telephone Encounter (Signed)
Pt called asking for a refilll of his gabapentin be sent to his [facility] pharmacy please

## 2020-03-27 NOTE — Telephone Encounter (Signed)
Ok thanks. Call in if you can , if not I will do this afternoon . Thank you

## 2020-03-27 NOTE — Telephone Encounter (Signed)
Rx sent to pharmacy. I attempted to reach patient to advise, disconnected.

## 2020-04-02 ENCOUNTER — Other Ambulatory Visit: Payer: Self-pay

## 2020-04-02 ENCOUNTER — Telehealth: Payer: Self-pay | Admitting: Surgery

## 2020-04-02 ENCOUNTER — Ambulatory Visit (INDEPENDENT_AMBULATORY_CARE_PROVIDER_SITE_OTHER): Payer: PRIVATE HEALTH INSURANCE | Admitting: Surgery

## 2020-04-02 VITALS — BP 117/83 | HR 94 | Temp 98.9°F

## 2020-04-02 DIAGNOSIS — M5020 Other cervical disc displacement, unspecified cervical region: Secondary | ICD-10-CM

## 2020-04-02 NOTE — Telephone Encounter (Signed)
Did you already fax it?

## 2020-04-02 NOTE — Telephone Encounter (Signed)
Faxed and pt informed.

## 2020-04-02 NOTE — Telephone Encounter (Signed)
No, I was not able to. Tried to get it to go through from [place] office but it would not. Please fax. Thank you.

## 2020-04-02 NOTE — Telephone Encounter (Signed)
Can you please let patient know this is done. He may want to pick up copy at front desk.

## 2020-04-02 NOTE — Telephone Encounter (Signed)
[place] for note keeping him out for 6 weeks?

## 2020-04-02 NOTE — Telephone Encounter (Signed)
Ok for note that states he will be having surgery and has not been scheduled yet. After surgery he will be out of work for 6 wks.      THx

## 2020-04-02 NOTE — Telephone Encounter (Signed)
Patient called. Says he needs a work note stating that he will be having surgery and out of work for [employer], faxed to [phone]

## 2020-04-03 ENCOUNTER — Encounter: Payer: Self-pay | Admitting: Surgery

## 2020-04-03 ENCOUNTER — Other Ambulatory Visit: Payer: Self-pay

## 2020-04-03 NOTE — Progress Notes (Signed)
49 year old white male history of C6-7 HNP/stenosis comes in for preop evaluation.  Continues have ongoing neck pain and upper extremity radiculopathy that is unchanged from previous visit.  He is want to proceed with C6-7 ACDF is scheduled.  Awaiting medical clearance.  Today history physical performed.  Review of systems negative.  Surgical procedure discussed.  Advised patient that he will need to wear his collar postop for at least 6 weeks and he will not be able to drive also for at least 6 weeks.  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  His job involves a lot of lifting, pushing, pulling and also getting up and down ladders and also down on the floor.  I advised him that this is not very realistic and that would significantly put him at risk for getting a cervical pseudoarthrosis.  Patient appeared a little disappointed to hear this.  We will proceed with surgery as scheduled but he will let us know if there are any issues or concerns regarding what was discussed.

## 2020-04-07 NOTE — H&P (Signed)
Marvin Macias is an 49 y.o. male.   Chief Complaint: Neck pain and upper extremity radiculopathy HPI: 49 year old white male history of C6-7 HNP/stenosis comes in for preop evaluation.  Continues have ongoing neck pain and upper extremity radiculopathy that is unchanged from previous visit.  He is want to proceed with C6-7 ACDF is scheduled.  Awaiting medical clearance.   Past Medical History:  Diagnosis Date  . Anxiety   . Depression   . HIV infection ([place])     No past surgical history on file.  Family History  Problem Relation Age of Onset  . Diabetes Father    Social History:  reports that he has been smoking cigarettes. He has a 18.75 pack-year smoking history. He has never used smokeless tobacco. He reports current alcohol use of about 7.0 standard drinks of alcohol per week. He reports current drug use. Frequency: 4.00 times per week. Drug: Marijuana.  Allergies:  Allergies  Allergen Reactions  . Codeine Nausea And Vomiting  . Erythromycin Swelling  . Hydrocodone-Acetaminophen Nausea And Vomiting  . Latex Itching    Burning  . Niacin Swelling  . Penicillins Hives    No medications prior to admission.    No results found for this or any previous visit (from the past 48 hour(s)). No results found.  Review of Systems  Constitutional: Positive for activity change.  HENT: Negative.   Respiratory: Negative.   Genitourinary: Negative.   Musculoskeletal: Positive for neck pain and neck stiffness.  Neurological: Negative.   Psychiatric/Behavioral: Negative.     There were no vitals taken for this visit. Physical Exam   Assessment/Plan C6-7 HNP/stenosis  We will proceed with C6-7 ACDF is scheduled. Surgery procedure discussed. All questions answered. I did advise patient that he can anticipate being out of work at least 6 weeks postop and he will not be able to drive for that time period. He will also be in cervical collar for at least 6 weeks. He voiced  understanding.  Benjiman Core, PA-C 04/07/2020, 4:59 PM

## 2020-04-07 NOTE — Progress Notes (Signed)
Your procedure is scheduled on Friday, April 17, 2020.  Report to [facility] Main Entrance "A" at 5:30 A.M., and check in at the Admitting office.  Call this number if you have problems the morning of surgery:  [phone]  Call [phone] if you have any questions prior to your surgery date Monday-Friday 8am-4pm    Remember:  Do not eat after midnight the night before your surgery  You may drink clear liquids until 4:30 AM the morning of your surgery.   Clear liquids allowed are: Water, Non-Citrus Juices (without pulp), Carbonated Beverages, Clear Tea, Black Coffee Only, and Gatorade  Please complete your PRE-SURGERY ENSURE that was provided to you by 4:30 AM the morning of surgery.  Please, if able, drink it in one setting. DO NOT SIP.     Take these medicines the morning of surgery with A SIP OF WATER:  emtricitabine-rilpivir-tenofovir AF (ODEFSEY)  fluticasone (FLONASE) gabapentin (NEURONTIN) MYTESI valACYclovir (VALTREX) trimethoprim-polymyxin b (POLYTRIM) ophthalmic solution - if needed   As of today, STOP taking any Aspirin (unless otherwise instructed by your surgeon) Aleve, Naproxen, Ibuprofen, Motrin, Advil, Goody's, BC's, all herbal medications, fish oil, and all vitamins.                      Do not wear jewelry.            Do not wear lotions, powders, colognes, or deodorant.            Do not shave 48 hours prior to surgery.  Men may shave face and neck.            Do not bring valuables to the hospital.            [facility] is not responsible for any belongings or valuables.  Do NOT Smoke (Tobacco/Vaping) or drink Alcohol 24 hours prior to your procedure If you use a CPAP at night, you may bring all equipment for your overnight stay.   Contacts, glasses, dentures or bridgework may not be worn into surgery.      For patients admitted to the hospital, discharge time will be determined by your treatment team.   Patients discharged the day of surgery will  not be allowed to drive home, and someone needs to stay with them for 24 hours.    Special instructions:   [place]- Preparing For Surgery  Before surgery, you can play an important role. Because skin is not sterile, your skin needs to be as free of germs as possible. You can reduce the number of germs on your skin by washing with CHG (chlorahexidine gluconate) Soap before surgery.  CHG is an antiseptic cleaner which kills germs and bonds with the skin to continue killing germs even after washing.    Oral Hygiene is also important to reduce your risk of infection.  Remember - BRUSH YOUR TEETH THE MORNING OF SURGERY WITH YOUR REGULAR TOOTHPASTE  Please do not use if you have an allergy to CHG or antibacterial soaps. If your skin becomes reddened/irritated stop using the CHG.  Do not shave (including legs and underarms) for at least 48 hours prior to first CHG shower. It is OK to shave your face.  Please follow these instructions carefully.   1. Shower the NIGHT BEFORE SURGERY and the MORNING OF SURGERY with CHG Soap.   2. If you chose to wash your hair, wash your hair first as usual with your normal shampoo.  3. After you shampoo,  rinse your hair and body thoroughly to remove the shampoo.  4. Use CHG as you would any other liquid soap. You can apply CHG directly to the skin and wash gently with a scrungie or a clean washcloth.   5. Apply the CHG Soap to your body ONLY FROM THE NECK DOWN.  Do not use on open wounds or open sores. Avoid contact with your eyes, ears, mouth and genitals (private parts). Wash Face and genitals (private parts)  with your normal soap.   6. Wash thoroughly, paying special attention to the area where your surgery will be performed.  7. Thoroughly rinse your body with warm water from the neck down.  8. DO NOT shower/wash with your normal soap after using and rinsing off the CHG Soap.  9. Pat yourself dry with a CLEAN TOWEL.  10. Wear CLEAN PAJAMAS to bed  the night before surgery  11. Place CLEAN SHEETS on your bed the night of your first shower and DO NOT SLEEP WITH PETS.   Day of Surgery: Wear Clean/Comfortable clothing the morning of surgery Do not apply any deodorants/lotions.   Remember to brush your teeth WITH YOUR REGULAR TOOTHPASTE.   Please read over the following fact sheets that you were given.

## 2020-04-08 ENCOUNTER — Encounter (HOSPITAL_COMMUNITY)
Admission: RE | Admit: 2020-04-08 | Discharge: 2020-04-08 | Disposition: A | Discharge status: Home / Self Care | Payer: Worker's Compensation | Source: Ambulatory Visit | Attending: Orthopaedic Surgery | Admitting: Orthopaedic Surgery

## 2020-04-08 ENCOUNTER — Other Ambulatory Visit: Payer: Self-pay

## 2020-04-08 ENCOUNTER — Encounter (HOSPITAL_COMMUNITY): Payer: Self-pay

## 2020-04-08 DIAGNOSIS — Z01812 Encounter for preprocedural laboratory examination: Secondary | ICD-10-CM | POA: Diagnosis not present

## 2020-04-08 DIAGNOSIS — Z01810 Encounter for preprocedural cardiovascular examination: Secondary | ICD-10-CM | POA: Insufficient documentation

## 2020-04-08 HISTORY — DX: Family history of other specified conditions: Z84.89

## 2020-04-08 HISTORY — DX: Essential (primary) hypertension: I10

## 2020-04-08 LAB — COMPREHENSIVE METABOLIC PANEL
ALT: 18 U/L (ref 0–44)
AST: 20 U/L (ref 15–41)
Albumin: 4.2 g/dL (ref 3.5–5.0)
Alkaline Phosphatase: 43 U/L (ref 38–126)
Anion gap: 11 (ref 5–15)
BUN: 11 mg/dL (ref 6–20)
CO2: 26 mmol/L (ref 22–32)
Calcium: 9 mg/dL (ref 8.9–10.3)
Chloride: 101 mmol/L (ref 98–111)
Creatinine, Ser: 0.84 mg/dL (ref 0.61–1.24)
GFR calc non Af Amer: >60 mL/min (ref >60)
Glucose, Bld: 82 mg/dL (ref 70–99)
Potassium: 4.1 mmol/L (ref 3.5–5.1)
Sodium: 138 mmol/L (ref 135–145)
Total Bilirubin: 0.8 mg/dL (ref 0.3–1.2)
Total Protein: 6.6 g/dL (ref 6.5–8.1)

## 2020-04-08 LAB — TYPE AND SCREEN
ABO/RH(D): A NEG
Antibody Screen: NEGATIVE

## 2020-04-08 LAB — URINALYSIS, ROUTINE W REFLEX MICROSCOPIC
Bilirubin Urine: NEGATIVE
Glucose, UA: NEGATIVE mg/dL
Hgb urine dipstick: NEGATIVE
Ketones, ur: NEGATIVE mg/dL
Leukocytes,Ua: NEGATIVE
Nitrite: NEGATIVE
Protein, ur: NEGATIVE mg/dL
Specific Gravity, Urine: 1.018 (ref 1.005–1.030)
pH: 6 (ref 5.0–8.0)

## 2020-04-08 LAB — CBC
HCT: 49.6 % (ref 39.0–52.0)
Hemoglobin: 17 g/dL (ref 13.0–17.0)
MCH: 37.9 pg — ABNORMAL HIGH (ref 26.0–34.0)
MCHC: 34.3 g/dL (ref 30.0–36.0)
MCV: 110.5 fL — ABNORMAL HIGH (ref 80.0–100.0)
Platelets: 231 10*3/uL (ref 150–400)
RBC: 4.49 MIL/uL (ref 4.22–5.81)
RDW: 11.9 % (ref 11.5–15.5)
WBC: 10.8 10*3/uL — ABNORMAL HIGH (ref 4.0–10.5)
nRBC: 0 % (ref 0.0–0.2)

## 2020-04-08 LAB — SURGICAL PCR SCREEN
MRSA, PCR: NEGATIVE
Staphylococcus aureus: NEGATIVE

## 2020-04-08 NOTE — Progress Notes (Signed)
PCP - Dr. Johnnye Sima in [place]  Chest x-ray - Not indicated EKG - 04/08/20 Stress Test - Years ago  ECHO - Denies Cardiac Cath - Denesi  Sleep Study - No OSA  DM - Denies  ERAS Protcol -Yes PRE-SURGERY Ensure given   COVID TEST- 04/14/20  Anesthesia review: No   Patient denies shortness of breath, fever, cough and chest pain at PAT appointment   All instructions explained to the patient, with a verbal understanding of the material. Patient agrees to go over the instructions while at home for a better understanding. Patient also instructed to self quarantine after being tested for COVID-19. The opportunity to ask questions was provided.

## 2020-04-14 ENCOUNTER — Other Ambulatory Visit (HOSPITAL_COMMUNITY)
Admission: RE | Admit: 2020-04-14 | Discharge: 2020-04-14 | Disposition: A | Discharge status: Home / Self Care | Payer: Worker's Compensation | Source: Ambulatory Visit | Attending: Orthopaedic Surgery | Admitting: Orthopaedic Surgery

## 2020-04-14 DIAGNOSIS — Z20822 Contact with and (suspected) exposure to covid-19: Secondary | ICD-10-CM | POA: Insufficient documentation

## 2020-04-14 DIAGNOSIS — Z01818 Encounter for other preprocedural examination: Secondary | ICD-10-CM | POA: Diagnosis not present

## 2020-04-14 LAB — SARS CORONAVIRUS 2 (TAT 6-24 HRS): SARS Coronavirus 2: NEGATIVE

## 2020-04-16 ENCOUNTER — Telehealth: Payer: Self-pay

## 2020-04-16 ENCOUNTER — Encounter: Payer: Self-pay | Admitting: Infectious Diseases

## 2020-04-16 ENCOUNTER — Encounter (HOSPITAL_COMMUNITY): Payer: Self-pay | Admitting: Orthopaedic Surgery

## 2020-04-16 NOTE — Telephone Encounter (Signed)
Patient called he is requesting a medical records request form to be faxed to him Fax:[phone] Call back:[phone]

## 2020-04-16 NOTE — Telephone Encounter (Signed)
Can you please send this for me?

## 2020-04-16 NOTE — Telephone Encounter (Signed)
faxed

## 2020-04-17 ENCOUNTER — Other Ambulatory Visit: Payer: Self-pay

## 2020-04-17 ENCOUNTER — Ambulatory Visit (HOSPITAL_COMMUNITY): Payer: Worker's Compensation | Admitting: Anesthesiology

## 2020-04-17 ENCOUNTER — Observation Stay (HOSPITAL_COMMUNITY)
Admission: RE | Admit: 2020-04-17 | Discharge: 2020-04-18 | Disposition: A | Discharge status: Home / Self Care | Payer: Worker's Compensation | Attending: Orthopaedic Surgery | Admitting: Orthopaedic Surgery

## 2020-04-17 ENCOUNTER — Ambulatory Visit (HOSPITAL_COMMUNITY): Payer: Worker's Compensation

## 2020-04-17 ENCOUNTER — Encounter (HOSPITAL_COMMUNITY)
Admission: RE | Disposition: A | Discharge status: Home / Self Care | Payer: Self-pay | Source: Home / Self Care | Attending: Orthopaedic Surgery

## 2020-04-17 ENCOUNTER — Encounter (HOSPITAL_COMMUNITY): Payer: Self-pay | Admitting: Orthopaedic Surgery

## 2020-04-17 DIAGNOSIS — F17210 Nicotine dependence, cigarettes, uncomplicated: Secondary | ICD-10-CM | POA: Insufficient documentation

## 2020-04-17 DIAGNOSIS — M4802 Spinal stenosis, cervical region: Secondary | ICD-10-CM

## 2020-04-17 DIAGNOSIS — Z91040 Latex allergy status: Secondary | ICD-10-CM | POA: Insufficient documentation

## 2020-04-17 DIAGNOSIS — M47812 Spondylosis without myelopathy or radiculopathy, cervical region: Secondary | ICD-10-CM | POA: Diagnosis not present

## 2020-04-17 DIAGNOSIS — I10 Essential (primary) hypertension: Secondary | ICD-10-CM | POA: Diagnosis not present

## 2020-04-17 DIAGNOSIS — Z419 Encounter for procedure for purposes other than remedying health state, unspecified: Secondary | ICD-10-CM

## 2020-04-17 DIAGNOSIS — Z20822 Contact with and (suspected) exposure to covid-19: Secondary | ICD-10-CM | POA: Diagnosis not present

## 2020-04-17 HISTORY — PX: ANTERIOR CERVICAL DECOMP/DISCECTOMY FUSION: SHX1161

## 2020-04-17 LAB — ABO/RH: ABO/RH(D): A NEG

## 2020-04-17 LAB — SARS CORONAVIRUS 2 BY RT PCR (HOSPITAL ORDER, PERFORMED IN ~~LOC~~ HOSPITAL LAB): SARS Coronavirus 2: NEGATIVE

## 2020-04-17 SURGERY — ANTERIOR CERVICAL DECOMPRESSION/DISCECTOMY FUSION 1 LEVEL
Anesthesia: General | Site: Spine Cervical

## 2020-04-17 MED ORDER — BUPIVACAINE-EPINEPHRINE 0.5% -1:200000 IJ SOLN
INTRAMUSCULAR | Status: AC
  Filled 2020-04-17: qty 1

## 2020-04-17 MED ORDER — 0.9 % SODIUM CHLORIDE (POUR BTL) OPTIME
TOPICAL | Status: DC | PRN
  Administered 2020-04-17: 1000 mL

## 2020-04-17 MED ORDER — SODIUM CHLORIDE 0.9 % IV SOLN: INTRAVENOUS | Status: DC

## 2020-04-17 MED ORDER — OXYCODONE-ACETAMINOPHEN 5-325 MG PO TABS: 1.0000 | ORAL_TABLET | ORAL | 0 refills | Status: DC | PRN

## 2020-04-17 MED ORDER — PROPOFOL 10 MG/ML IV BOLUS
INTRAVENOUS | Status: AC
  Filled 2020-04-17: qty 40

## 2020-04-17 MED ORDER — HEMOSTATIC AGENTS (NO CHARGE) OPTIME
TOPICAL | Status: DC | PRN
  Administered 2020-04-17: 1

## 2020-04-17 MED ORDER — MENTHOL 3 MG MT LOZG: 1.0000 | LOZENGE | OROMUCOSAL | Status: DC | PRN

## 2020-04-17 MED ORDER — FENTANYL CITRATE (PF) 250 MCG/5ML IJ SOLN
INTRAMUSCULAR | Status: DC | PRN
Start: 2020-04-17 — End: 2020-04-17
  Administered 2020-04-17: 50 ug via INTRAVENOUS
  Administered 2020-04-17: 150 ug via INTRAVENOUS

## 2020-04-17 MED ORDER — BUPIVACAINE-EPINEPHRINE 0.5% -1:200000 IJ SOLN
INTRAMUSCULAR | Status: DC | PRN
  Administered 2020-04-17: 6 mL

## 2020-04-17 MED ORDER — EMTRICITAB-RILPIVIR-TENOFOV AF 200-25-25 MG PO TABS
1.0000 | ORAL_TABLET | Freq: Every day | ORAL | Status: DC
  Administered 2020-04-17: 1 via ORAL
  Filled 2020-04-17: qty 1

## 2020-04-17 MED ORDER — DEXAMETHASONE SODIUM PHOSPHATE 10 MG/ML IJ SOLN
INTRAMUSCULAR | Status: DC | PRN
  Administered 2020-04-17: 10 mg via INTRAVENOUS

## 2020-04-17 MED ORDER — ONDANSETRON HCL 4 MG/2ML IJ SOLN
INTRAMUSCULAR | Status: DC | PRN
  Administered 2020-04-17: 4 mg via INTRAVENOUS

## 2020-04-17 MED ORDER — METHOCARBAMOL 500 MG PO TABS: 500.0000 mg | ORAL_TABLET | Freq: Four times a day (QID) | ORAL | 0 refills | Status: DC | PRN

## 2020-04-17 MED ORDER — CROFELEMER 125 MG PO TBEC: 125.0000 mg | DELAYED_RELEASE_TABLET | Freq: Two times a day (BID) | ORAL | Status: DC

## 2020-04-17 MED ORDER — SUCCINYLCHOLINE CHLORIDE 200 MG/10ML IV SOSY
PREFILLED_SYRINGE | INTRAVENOUS | Status: DC | PRN
  Administered 2020-04-17: 120 mg via INTRAVENOUS

## 2020-04-17 MED ORDER — FENTANYL CITRATE (PF) 100 MCG/2ML IJ SOLN
25.0000 ug | INTRAMUSCULAR | Status: DC | PRN
  Administered 2020-04-17 (×2): 50 ug via INTRAVENOUS

## 2020-04-17 MED ORDER — DEXMEDETOMIDINE (PRECEDEX) IN NS 20 MCG/5ML (4 MCG/ML) IV SYRINGE
PREFILLED_SYRINGE | INTRAVENOUS | Status: AC
  Filled 2020-04-17: qty 5

## 2020-04-17 MED ORDER — NICOTINE 14 MG/24HR TD PT24
14.0000 mg | MEDICATED_PATCH | Freq: Every day | TRANSDERMAL | Status: DC
  Administered 2020-04-17: 14 mg via TRANSDERMAL
  Filled 2020-04-17: qty 1

## 2020-04-17 MED ORDER — LIDOCAINE 2% (20 MG/ML) 5 ML SYRINGE
INTRAMUSCULAR | Status: AC
  Filled 2020-04-17: qty 5

## 2020-04-17 MED ORDER — SODIUM CHLORIDE 0.9% FLUSH
3.0000 mL | Freq: Two times a day (BID) | INTRAVENOUS | Status: DC
  Administered 2020-04-17: 3 mL via INTRAVENOUS

## 2020-04-17 MED ORDER — HYDROCHLOROTHIAZIDE 12.5 MG PO CAPS: 12.5000 mg | ORAL_CAPSULE | Freq: Every day | ORAL | Status: DC

## 2020-04-17 MED ORDER — ONDANSETRON HCL 4 MG PO TABS: 4.0000 mg | ORAL_TABLET | Freq: Four times a day (QID) | ORAL | Status: DC | PRN

## 2020-04-17 MED ORDER — ORAL CARE MOUTH RINSE: 15.0000 mL | Freq: Once | OROMUCOSAL | Status: AC

## 2020-04-17 MED ORDER — DEXAMETHASONE SODIUM PHOSPHATE 10 MG/ML IJ SOLN
INTRAMUSCULAR | Status: AC
  Filled 2020-04-17: qty 1

## 2020-04-17 MED ORDER — FLUTICASONE PROPIONATE 50 MCG/ACT NA SUSP: 2.0000 | Freq: Every day | NASAL | Status: DC

## 2020-04-17 MED ORDER — METHOCARBAMOL 500 MG PO TABS
ORAL_TABLET | ORAL | Status: AC
  Filled 2020-04-17: qty 1

## 2020-04-17 MED ORDER — POLYETHYLENE GLYCOL 3350 17 G PO PACK: 17.0000 g | PACK | Freq: Every day | ORAL | Status: DC

## 2020-04-17 MED ORDER — LACTATED RINGERS IV SOLN: INTRAVENOUS | Status: DC

## 2020-04-17 MED ORDER — METHOCARBAMOL 1000 MG/10ML IJ SOLN
500.0000 mg | Freq: Four times a day (QID) | INTRAVENOUS | Status: DC | PRN
  Filled 2020-04-17: qty 5

## 2020-04-17 MED ORDER — LIDOCAINE 2% (20 MG/ML) 5 ML SYRINGE
INTRAMUSCULAR | Status: AC
  Filled 2020-04-17: qty 15

## 2020-04-17 MED ORDER — HYDROMORPHONE HCL 1 MG/ML IJ SOLN: 0.5000 mg | INTRAMUSCULAR | Status: DC | PRN

## 2020-04-17 MED ORDER — OXYCODONE HCL 5 MG PO TABS
ORAL_TABLET | ORAL | Status: AC
  Filled 2020-04-17: qty 1

## 2020-04-17 MED ORDER — PHENYLEPHRINE 40 MCG/ML (10ML) SYRINGE FOR IV PUSH (FOR BLOOD PRESSURE SUPPORT)
PREFILLED_SYRINGE | INTRAVENOUS | Status: AC
  Filled 2020-04-17: qty 10

## 2020-04-17 MED ORDER — CHLORHEXIDINE GLUCONATE 0.12 % MT SOLN
15.0000 mL | Freq: Once | OROMUCOSAL | Status: AC
  Administered 2020-04-17: 15 mL via OROMUCOSAL
  Filled 2020-04-17: qty 15

## 2020-04-17 MED ORDER — SODIUM CHLORIDE 0.9% FLUSH: 3.0000 mL | INTRAVENOUS | Status: DC | PRN

## 2020-04-17 MED ORDER — PHENOL 1.4 % MT LIQD: 1.0000 | OROMUCOSAL | Status: DC | PRN

## 2020-04-17 MED ORDER — METHOCARBAMOL 500 MG PO TABS
500.0000 mg | ORAL_TABLET | Freq: Four times a day (QID) | ORAL | Status: DC | PRN
  Administered 2020-04-17 – 2020-04-18 (×3): 500 mg via ORAL
  Filled 2020-04-17 (×2): qty 1

## 2020-04-17 MED ORDER — GABAPENTIN 100 MG PO CAPS
100.0000 mg | ORAL_CAPSULE | Freq: Two times a day (BID) | ORAL | Status: DC
  Administered 2020-04-17 (×2): 100 mg via ORAL
  Filled 2020-04-17 (×2): qty 1

## 2020-04-17 MED ORDER — SUCCINYLCHOLINE CHLORIDE 200 MG/10ML IV SOSY
PREFILLED_SYRINGE | INTRAVENOUS | Status: AC
  Filled 2020-04-17: qty 10

## 2020-04-17 MED ORDER — PHENYLEPHRINE 40 MCG/ML (10ML) SYRINGE FOR IV PUSH (FOR BLOOD PRESSURE SUPPORT)
PREFILLED_SYRINGE | INTRAVENOUS | Status: DC | PRN
  Administered 2020-04-17: 80 ug via INTRAVENOUS

## 2020-04-17 MED ORDER — ONDANSETRON HCL 4 MG/2ML IJ SOLN: 4.0000 mg | Freq: Four times a day (QID) | INTRAMUSCULAR | Status: DC | PRN

## 2020-04-17 MED ORDER — NICOTINE 14 MG/24HR TD PT24
14.0000 mg | MEDICATED_PATCH | Freq: Every day | TRANSDERMAL | Status: DC
  Filled 2020-04-17: qty 1

## 2020-04-17 MED ORDER — VANCOMYCIN HCL IN DEXTROSE 1-5 GM/200ML-% IV SOLN
1000.0000 mg | INTRAVENOUS | Status: AC
  Administered 2020-04-17: 1000 mg via INTRAVENOUS
  Filled 2020-04-17: qty 200

## 2020-04-17 MED ORDER — OXYCODONE-ACETAMINOPHEN 5-325 MG PO TABS
1.0000 | ORAL_TABLET | ORAL | 0 refills | Status: DC | PRN
Start: 2020-04-17 — End: 2020-04-17

## 2020-04-17 MED ORDER — ONDANSETRON HCL 4 MG/2ML IJ SOLN
INTRAMUSCULAR | Status: AC
  Filled 2020-04-17: qty 2

## 2020-04-17 MED ORDER — ROCURONIUM BROMIDE 10 MG/ML (PF) SYRINGE
PREFILLED_SYRINGE | INTRAVENOUS | Status: DC | PRN
  Administered 2020-04-17: 50 mg via INTRAVENOUS
  Administered 2020-04-17: 20 mg via INTRAVENOUS

## 2020-04-17 MED ORDER — FENTANYL CITRATE (PF) 100 MCG/2ML IJ SOLN
INTRAMUSCULAR | Status: AC
  Filled 2020-04-17: qty 2

## 2020-04-17 MED ORDER — OXYCODONE HCL 5 MG PO TABS
5.0000 mg | ORAL_TABLET | ORAL | Status: DC | PRN
  Administered 2020-04-17 – 2020-04-18 (×6): 5 mg via ORAL
  Filled 2020-04-17 (×5): qty 1

## 2020-04-17 MED ORDER — LIDOCAINE 2% (20 MG/ML) 5 ML SYRINGE
INTRAMUSCULAR | Status: DC | PRN
  Administered 2020-04-17: 60 mg via INTRAVENOUS

## 2020-04-17 MED ORDER — ROCURONIUM BROMIDE 10 MG/ML (PF) SYRINGE
PREFILLED_SYRINGE | INTRAVENOUS | Status: AC
  Filled 2020-04-17: qty 10

## 2020-04-17 MED ORDER — MIDAZOLAM HCL 2 MG/2ML IJ SOLN
INTRAMUSCULAR | Status: AC
  Filled 2020-04-17: qty 2

## 2020-04-17 MED ORDER — MIDAZOLAM HCL 2 MG/2ML IJ SOLN
INTRAMUSCULAR | Status: DC | PRN
  Administered 2020-04-17: 2 mg via INTRAVENOUS

## 2020-04-17 MED ORDER — LACTATED RINGERS IV SOLN: INTRAVENOUS | Status: DC | PRN

## 2020-04-17 MED ORDER — PHENYLEPHRINE HCL-NACL 10-0.9 MG/250ML-% IV SOLN
INTRAVENOUS | Status: DC | PRN
  Administered 2020-04-17: 25 ug/min via INTRAVENOUS

## 2020-04-17 MED ORDER — PROPOFOL 10 MG/ML IV BOLUS
INTRAVENOUS | Status: DC | PRN
  Administered 2020-04-17: 200 mg via INTRAVENOUS

## 2020-04-17 MED ORDER — VALSARTAN-HYDROCHLOROTHIAZIDE 80-12.5 MG PO TABS: 1.0000 | ORAL_TABLET | Freq: Every day | ORAL | Status: DC

## 2020-04-17 MED ORDER — FENTANYL CITRATE (PF) 250 MCG/5ML IJ SOLN
INTRAMUSCULAR | Status: AC
Start: 2020-04-17 — End: ?
  Filled 2020-04-17: qty 5

## 2020-04-17 MED ORDER — IRBESARTAN 150 MG PO TABS: 75.0000 mg | ORAL_TABLET | Freq: Every day | ORAL | Status: DC

## 2020-04-17 MED ORDER — DOCUSATE SODIUM 100 MG PO CAPS
100.0000 mg | ORAL_CAPSULE | Freq: Two times a day (BID) | ORAL | Status: DC
  Administered 2020-04-17: 100 mg via ORAL
  Filled 2020-04-17 (×2): qty 1

## 2020-04-17 SURGICAL SUPPLY — 54 items
AGENT HMST KT MTR STRL THRMB (HEMOSTASIS) ×1
APL SKNCLS STERI-STRIP NONHPOA (GAUZE/BANDAGES/DRESSINGS) ×1
BENZOIN TINCTURE PRP APPL 2/3 (GAUZE/BANDAGES/DRESSINGS) ×2 IMPLANT
BIT DRILL SPINE QC 14 (BIT) ×1 IMPLANT
BONE CC-ACS 11X14X7 6D (Bone Implant) ×2 IMPLANT
BUR ROUND FLUTED 4 SOFT TCH (BURR) ×1 IMPLANT
CHIPS BONE CANC-ACS11X14X7 6D (Bone Implant) IMPLANT
COLLAR CERV LO CONTOUR FIRM DE (SOFTGOODS) ×2 IMPLANT
COVER MAYO STAND STRL (DRAPES) ×2 IMPLANT
COVER SURGICAL LIGHT HANDLE (MISCELLANEOUS) ×2 IMPLANT
COVER WAND RF STERILE (DRAPES) ×2 IMPLANT
DRAPE C-ARM 42X72 X-RAY (DRAPES) ×2 IMPLANT
DRAPE HALF SHEET 40X57 (DRAPES) ×2 IMPLANT
DRAPE MICROSCOPE LEICA (MISCELLANEOUS) ×2 IMPLANT
DURAPREP 6ML APPLICATOR 50/CS (WOUND CARE) ×2 IMPLANT
ELECT COATED BLADE 2.86 ST (ELECTRODE) ×2 IMPLANT
ELECT REM PT RETURN 9FT ADLT (ELECTROSURGICAL) ×2
ELECTRODE REM PT RTRN 9FT ADLT (ELECTROSURGICAL) ×1 IMPLANT
EVACUATOR 1/8 PVC DRAIN (DRAIN) ×2 IMPLANT
GAUZE SPONGE 4X4 12PLY STRL (GAUZE/BANDAGES/DRESSINGS) ×2 IMPLANT
GLOVE BIOGEL PI IND STRL 8 (GLOVE) ×2 IMPLANT
GLOVE BIOGEL PI INDICATOR 8 (GLOVE) ×2
GLOVE SURG SS PI 7.0 STRL IVOR (GLOVE) ×1 IMPLANT
GLOVE SURG SS PI 7.5 STRL IVOR (GLOVE) ×2 IMPLANT
GLOVE SURG SS PI 8.5 STRL IVOR (GLOVE) ×3
GLOVE SURG SS PI 8.5 STRL STRW (GLOVE) IMPLANT
GOWN STRL REUS W/ TWL LRG LVL3 (GOWN DISPOSABLE) ×1 IMPLANT
GOWN STRL REUS W/ TWL XL LVL3 (GOWN DISPOSABLE) ×1 IMPLANT
GOWN STRL REUS W/TWL 2XL LVL3 (GOWN DISPOSABLE) ×2 IMPLANT
GOWN STRL REUS W/TWL LRG LVL3 (GOWN DISPOSABLE) ×2
GOWN STRL REUS W/TWL XL LVL3 (GOWN DISPOSABLE) ×2
HALTER HD/CHIN CERV TRACTION D (MISCELLANEOUS) ×2 IMPLANT
KIT BASIN OR (CUSTOM PROCEDURE TRAY) ×2 IMPLANT
KIT TURNOVER KIT B (KITS) ×2 IMPLANT
MANIFOLD NEPTUNE II (INSTRUMENTS) ×2 IMPLANT
NDL 25GX 5/8IN NON SAFETY (NEEDLE) ×1 IMPLANT
NEEDLE 25GX 5/8IN NON SAFETY (NEEDLE) ×2 IMPLANT
NS IRRIG 1000ML POUR BTL (IV SOLUTION) ×2 IMPLANT
PACK ORTHO CERVICAL (CUSTOM PROCEDURE TRAY) ×2 IMPLANT
PAD ARMBOARD 7.5X6 YLW CONV (MISCELLANEOUS) ×4 IMPLANT
PLATE ANT CERV XTEND 1 LV 16 (Plate) ×1 IMPLANT
POSITIONER HEAD DONUT 9IN (MISCELLANEOUS) ×2 IMPLANT
SCREW XTD VAR 4.2 SELF TAP (Screw) ×4 IMPLANT
SCREW XTD VAR 4.2 SELF TAP 12 (Screw) ×4 IMPLANT
STRIP CLOSURE SKIN 1/2X4 (GAUZE/BANDAGES/DRESSINGS) ×2 IMPLANT
SURGIFLO W/THROMBIN 8M KIT (HEMOSTASIS) ×1 IMPLANT
SUT BONE WAX W31G (SUTURE) ×2 IMPLANT
SUT VIC AB 3-0 PS2 18 (SUTURE) ×2
SUT VIC AB 3-0 PS2 18XBRD (SUTURE) ×1 IMPLANT
SUT VIC AB 4-0 PS2 27 (SUTURE) ×2 IMPLANT
SYR BULB EAR ULCER 3OZ GRN STR (SYRINGE) ×2 IMPLANT
TOWEL GREEN STERILE (TOWEL DISPOSABLE) ×2 IMPLANT
TOWEL GREEN STERILE FF (TOWEL DISPOSABLE) ×2 IMPLANT
WATER STERILE IRR 1000ML POUR (IV SOLUTION) ×2 IMPLANT

## 2020-04-17 NOTE — Transfer of Care (Signed)
Immediate Anesthesia Transfer of Care Note  Patient: Marvin Macias  Procedure(s) Performed: CERVICAL SIX- CERVICAL SEVEN  ANTERIOR CERVICAL DECOMPRESSION/DISCECTOMY FUSION, ALLOGRAFT, PLATE (N/A Spine Cervical)  Patient Location: PACU  Anesthesia Type:General  Level of Consciousness: awake, alert  and oriented  Airway & Oxygen Therapy: Patient Spontanous Breathing  Post-op Assessment: Report given to RN and Post -op Vital signs reviewed and stable  Post vital signs: Reviewed and stable  Last Vitals:  Vitals Value Taken Time  BP 122/84 04/17/20 0956  Temp    Pulse 93 04/17/20 0957  Resp 16 04/17/20 0957  SpO2 97 % 04/17/20 0957  Vitals shown include unvalidated device data.  Last Pain:  Vitals:   04/17/20 0626  TempSrc:   PainSc: 0-No pain         Complications: No complications documented.

## 2020-04-17 NOTE — Discharge Instructions (Addendum)
Ok to shower 5 days postop. But must use extra collar provided.  Do not apply any creams or ointments to incision.  Do not remove steri-strips.  Can use 4x4 gauze and tape for dressing changes.    Cervical collar must be on at all times even when showering.  No aggressive activity.   Do not bend or turn neck.    No lifting, pushing, pulling.  No driving  If there are any issues with difficulty swallowing or breathing you should contact EMS or go immediately to the emergency room for evaluation.  Anticipate out of work at least 6 weeks postop

## 2020-04-17 NOTE — Progress Notes (Signed)
Pt. Stated after being swabbed for covid he did go pick up food at a resturant and he also went to work. Notified Dr. Therisa Doyne. He stated he will discuss with Dr. Nyoka Cowden.

## 2020-04-17 NOTE — Anesthesia Postprocedure Evaluation (Signed)
Anesthesia Post Note  Patient: Marvin Macias  Procedure(s) Performed: CERVICAL SIX- CERVICAL SEVEN  ANTERIOR CERVICAL DECOMPRESSION/DISCECTOMY FUSION, ALLOGRAFT, PLATE (N/A Spine Cervical)     Anesthesia Post Evaluation No complications documented.  Last Vitals:  Vitals:   04/17/20 1041 04/17/20 1056  BP: (!) 129/93 126/82  Pulse: 97 81  Resp: 15 15  Temp:  36.4 C  SpO2: 97% 97%    Last Pain:  Vitals:   04/17/20 1056  TempSrc:   PainSc: Asleep                 Soriah Leeman

## 2020-04-17 NOTE — Interval H&P Note (Signed)
History and Physical Interval Note:  04/17/2020 7:21 AM  Marvin Macias  has presented today for surgery, with the diagnosis of C6-7 foraminal stenosis.  The various methods of treatment have been discussed with the patient and family. After consideration of risks, benefits and other options for treatment, the patient has consented to  Procedure(s): C6-7 ANTERIOR CERVICAL DECOMPRESSION/DISCECTOMY FUSION, ALLOGRAFT, PLATE (N/A) as a surgical intervention.  The patient's history has been reviewed, patient examined, no change in status, stable for surgery.  I have reviewed the patient's chart and labs.  Questions were answered to the patient's satisfaction.     Marybelle Killings

## 2020-04-17 NOTE — Progress Notes (Signed)
Orthopedic Tech Progress Note Patient Details:  Marvin Macias Nov 13, 1970 [id number]  Ortho Devices Type of Ortho Device: Soft collar Ortho Device/Splint Location: NECK Ortho Device/Splint Interventions: Other (comment)   Post Interventions Patient Tolerated: Well Instructions Provided: Care of device   Janit Pagan 04/17/2020, 12:43 PM

## 2020-04-17 NOTE — Anesthesia Procedure Notes (Signed)
Procedure Name: Intubation Date/Time: 04/17/2020 8:26 AM Performed by: Kathryne Hitch, CRNA Pre-anesthesia Checklist: Patient identified, Emergency Drugs available, Suction available and Patient being monitored Patient Re-evaluated:Patient Re-evaluated prior to induction Oxygen Delivery Method: Circle system utilized Preoxygenation: Pre-oxygenation with 100% oxygen Induction Type: IV induction Ventilation: Mask ventilation without difficulty Laryngoscope Size: Glidescope and 4 Grade View: Grade I Tube type: Oral Tube size: 7.5 mm Number of attempts: 1 Airway Equipment and Method: Stylet and Oral airway Placement Confirmation: ETT inserted through vocal cords under direct vision,  positive ETCO2 and breath sounds checked- equal and bilateral Secured at: 23 cm Tube secured with: Tape Dental Injury: Teeth and Oropharynx as per pre-operative assessment

## 2020-04-17 NOTE — Anesthesia Postprocedure Evaluation (Signed)
Anesthesia Post Note  Patient: Marvin Macias  Procedure(s) Performed: CERVICAL SIX- CERVICAL SEVEN  ANTERIOR CERVICAL DECOMPRESSION/DISCECTOMY FUSION, ALLOGRAFT, PLATE (N/A Spine Cervical)     Patient location during evaluation: PACU Anesthesia Type: General Level of consciousness: awake Pain management: pain level controlled Vital Signs Assessment: post-procedure vital signs reviewed and stable Respiratory status: spontaneous breathing Cardiovascular status: stable Postop Assessment: no apparent nausea or vomiting Anesthetic complications: no   No complications documented.  Last Vitals:  Vitals:   04/17/20 0550  BP: 133/90  Pulse: 77  Resp: 20  Temp: 36.9 C  SpO2: 100%    Last Pain:  Vitals:   04/17/20 0626  TempSrc:   PainSc: 0-No pain                 Kenniel Bergsma

## 2020-04-17 NOTE — Op Note (Signed)
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  Postop diagnosis: Same  Procedure: C6-7 anterior cervical discectomy and fusion, allograft and plate.  Surgeon: Rodell Perna, MD  Assistant: Benjiman Core, PA-C medically necessary and present for the entire procedure.  Drains: 1 Hemovac neck  Anesthesia: General, orotracheal +6 cc Marcaine skin local  Implants:Globus  XTEND 16 mm plate.  12 mm screws x4.  7 mmMTF allograft all placed at C6-7 level.  Procedure: After standard prepping and draping arms tucked at the side intubation using the glide scope had alter traction without weight wrist restraints were pulled down for visualization with fluoroscopy as needed neck was prepped with DuraPrep the area was squared with towel sterile skin marker Betadine Vi-Drape Steri-Drape application sterile metal stent the head thyroid sheets and drapes.  Timeout procedure was completed Ancef was given prophylactically.  Incision was made starting at the midline extending the left in line with the prominent skin fold at C6-7 level based on palpable landmarks cricothyroid, clavicle and carotid tubercle.  Platysma was divided in line with the skin incision blunt dissection down the level longus Coley prominent spurs at C6-7 and short 25 needle was placed confirm with the sterilely draped lateral C arm that this was the appropriate level  Spurs moved anteriorly self-retaining plaqued Cloward retractor was placed deflated right left small plate cephalad caudad.  Operative microscope was draped and sequential discectomy was performed progressing back to the posterior cortex where there was overhanging spurs and severe left foraminal stenosis.  Small curettes were used to remove the bone since the 1 mm to a pituitary would not slide underneath the prominent spurs.  Once they were decompressed trial sizers showed a 6 was somewhat snug 7 gave a nice fit and distracted the space back to its normal  size.  Anterior spurs been removed plate was selected single screw was placed checked under C arm.  Patient was not a large patient and the 40 mm screws used on male patient's touched and penetrated 1/2 mm through the posterior cortex.  We switched the 14 mm screws for 12 which were appropriate length for smaller status.  AP and lateral final spot pictures were taken showing good position alignment graft was in good position screws were locked down.  Epidural space was dry there was room adjacent to graft for egress of any fluids.  Copious irrigation.  Hemovacs placed in and out technique.  Locking screws were tightened down x4.  Platysma reapproximated 3-0 Vicryl for Vicryl subcuticular closure tincture benzoin Steri-Strips 4 x 4's tape and soft cervical collar application.  Patient tolerated the procedure well transfer the care room stable condition.

## 2020-04-17 NOTE — Anesthesia Preprocedure Evaluation (Signed)
Anesthesia Evaluation  Patient identified by MRN, date of birth, ID band Patient awake    Reviewed: Allergy & Precautions, NPO status , Patient's Chart, lab work & pertinent test results  Airway Mallampati: II  TM Distance: >3 FB     Dental   Pulmonary Current Smoker and Patient abstained from smoking.,    breath sounds clear to auscultation       Cardiovascular hypertension,  Rhythm:Regular Rate:Normal     Neuro/Psych    GI/Hepatic negative GI ROS, Neg liver ROS,   Endo/Other    Renal/GU negative Renal ROS     Musculoskeletal   Abdominal   Peds  Hematology   Anesthesia Other Findings   Reproductive/Obstetrics                             Anesthesia Physical Anesthesia Plan  ASA: II  Anesthesia Plan: General   Post-op Pain Management:    Induction: Intravenous  PONV Risk Score and Plan: 2 and Ondansetron, Dexamethasone and Midazolam  Airway Management Planned: Oral ETT  Additional Equipment:   Intra-op Plan:   Post-operative Plan: Extubation in OR  Informed Consent: I have reviewed the patients History and Physical, chart, labs and discussed the procedure including the risks, benefits and alternatives for the proposed anesthesia with the patient or authorized representative who has indicated his/her understanding and acceptance.     Dental advisory given  Plan Discussed with: CRNA and Anesthesiologist  Anesthesia Plan Comments:         Anesthesia Quick Evaluation

## 2020-04-18 DIAGNOSIS — M4802 Spinal stenosis, cervical region: Secondary | ICD-10-CM | POA: Diagnosis not present

## 2020-04-18 MED ORDER — NICOTINE 14 MG/24HR TD PT24: 14.0000 mg | MEDICATED_PATCH | Freq: Every day | TRANSDERMAL | 0 refills | Status: DC

## 2020-04-18 NOTE — Progress Notes (Signed)
Patient is discharged from room 3C09 at this time. Alert and in stable condition. IV site d/c'd and instructions read to patient with understanding verbalized and all questions answered. Ambulate out of unit with all belongings at side.

## 2020-04-18 NOTE — Progress Notes (Signed)
Patient stable no events pain controlled eating breakfast eager to go home. Rx provided Dc home today F/u with Dr. Lorin Mercy in office in 2 weeks

## 2020-04-18 NOTE — Discharge Summary (Signed)
Patient ID: Marvin Macias MRN: [id number] DOB/AGE: [birth year] 49 y.o.  Admit date: 04/17/2020 Discharge date: 04/18/2020  Admission Diagnoses:  <principal problem not specified>  Discharge Diagnoses:  Active Problems:   Neural foraminal stenosis of cervical spine   Cervical spinal stenosis   Past Medical History:  Diagnosis Date  . Anxiety   . Depression   . Family history of adverse reaction to anesthesia    Mother had vomiting  . HIV infection ([place])   . Hypertension     Surgeries: Procedure(s): CERVICAL SIX- CERVICAL SEVEN  ANTERIOR CERVICAL DECOMPRESSION/DISCECTOMY FUSION, ALLOGRAFT, PLATE on 35/46/5681   Consultants (if any):   Discharged Condition: Improved  Hospital Course: Marvin Macias is an 49 y.o. male who was admitted 04/17/2020 with a diagnosis of <principal problem not specified> and went to the operating room on 04/17/2020 and underwent the above named procedures.    He was given perioperative antibiotics:  Anti-infectives (From admission, onward)   Start     Dose/Rate Route Frequency Ordered Stop   04/17/20 1245  emtricitabine-rilpivir-tenofovir AF (ODEFSEY) 200-25-25 MG per tablet 1 tablet        1 tablet Oral Daily with breakfast 04/17/20 1218     04/17/20 0600  vancomycin (VANCOCIN) IVPB 1000 mg/200 mL premix        1,000 mg 200 mL/hr over 60 Minutes Intravenous On call to O.R. 04/17/20 0555 04/17/20 1314    .  He was given sequential compression devices, early ambulation, and appropriate chemoprophylaxis for DVT prophylaxis.  He benefited maximally from the hospital stay and there were no complications.    Recent vital signs:  Vitals:   04/17/20 2028 04/17/20 2356  BP: 126/85 (!) 127/50  Pulse: 78 64  Resp: 18 18  Temp: 98.2 F (36.8 C) 98.4 F (36.9 C)  SpO2: 100% 100%    Recent laboratory studies:  Lab Results  Component Value Date   HGB 17.0 04/08/2020   HGB 16.9 08/13/2019   HGB 18.0 (H) 08/06/2018   Lab Results   Component Value Date   WBC 10.8 (H) 04/08/2020   PLT 231 04/08/2020   No results found for: INR Lab Results  Component Value Date   NA 138 04/08/2020   K 4.1 04/08/2020   CL 101 04/08/2020   CO2 26 04/08/2020   BUN 11 04/08/2020   CREATININE 0.84 04/08/2020   GLUCOSE 82 04/08/2020    Discharge Medications:   Allergies as of 04/18/2020      Reactions   Codeine Nausea And Vomiting   Erythromycin Swelling   Hydrocodone-acetaminophen Nausea And Vomiting   Latex Itching   Burning   Niacin Swelling   Penicillins Hives      Medication List    STOP taking these medications   clonazePAM 0.5 MG tablet Commonly known as: KLONOPIN   diphenoxylate-atropine 2.5-0.025 MG tablet Commonly known as: Lomotil   doxycycline 100 MG tablet Commonly known as: VIBRA-TABS   dronabinol 5 MG capsule Commonly known as: MARINOL   pregabalin 50 MG capsule Commonly known as: LYRICA   traMADol 50 MG tablet Commonly known as: ULTRAM     TAKE these medications   gabapentin 100 MG capsule Commonly known as: NEURONTIN TAKE 1 CAPSULE(100 MG) BY MOUTH TWICE DAILY What changed: See the new instructions.   methocarbamol 500 MG tablet Commonly known as: ROBAXIN Take 1 tablet (500 mg total) by mouth every 6 (six) hours as needed for muscle spasms.   Mytesi 125 MG Tbec  Generic drug: Crofelemer TAKE 1 TABLET BY MOUTH TWICE DAILY AT 10 AM AND AT 5 PM What changed: See the new instructions.   nicotine 14 mg/24hr patch Commonly known as: NICODERM CQ - dosed in mg/24 hours Place 1 patch (14 mg total) onto the skin daily.   Odefsey 200-25-25 MG Tabs tablet Generic drug: emtricitabine-rilpivir-tenofovir AF Take 1 tablet by mouth daily with breakfast.   oxyCODONE-acetaminophen 5-325 MG tablet Commonly known as: PERCOCET/ROXICET Take 1 tablet by mouth every 4 (four) hours as needed for severe pain.   valsartan-hydrochlorothiazide 80-12.5 MG tablet Commonly known as: DIOVAN-HCT TAKE 1  TABLET BY MOUTH DAILY       Diagnostic Studies: DG Cervical Spine 2-3 Views  Result Date: 04/17/2020 CLINICAL DATA:  C6-7 discectomy infusion. EXAM: DG C-ARM 1-60 MIN; CERVICAL SPINE - 2-3 VIEW COMPARISON:  MRI cervical spine 01/15/2020 FINDINGS: AP and lateral C-arm images were obtained following cervical spine ACDF. The C2 vertebral body is not included on the lateral view making accurate level assessment not possible. Probable C6-7 level based on the AP view. IMPRESSION: ACDF lower cervical spine. Recommend cervical spine radiographs to confirm level. Electronically Signed   By: Franchot Gallo M.D.   On: 04/17/2020 09:42   DG C-Arm 1-60 Min  Result Date: 04/17/2020 CLINICAL DATA:  C6-7 discectomy infusion. EXAM: DG C-ARM 1-60 MIN; CERVICAL SPINE - 2-3 VIEW COMPARISON:  MRI cervical spine 01/15/2020 FINDINGS: AP and lateral C-arm images were obtained following cervical spine ACDF. The C2 vertebral body is not included on the lateral view making accurate level assessment not possible. Probable C6-7 level based on the AP view. IMPRESSION: ACDF lower cervical spine. Recommend cervical spine radiographs to confirm level. Electronically Signed   By: Franchot Gallo M.D.   On: 04/17/2020 09:42    Disposition: Discharge disposition: 01-Home or Self Care       Discharge Instructions    Call MD / Call 911   Complete by: As directed    If you experience chest pain or shortness of breath, CALL 911 and be transported to the hospital emergency room.  If you develope a fever above 101.5 F, pus (white drainage) or increased drainage or redness at the wound, or calf pain, call your surgeon's office.   Constipation Prevention   Complete by: As directed    Drink plenty of fluids.  Prune juice may be helpful.  You may use a stool softener, such as Colace (over the counter) 100 mg twice a day.  Use MiraLax (over the counter) for constipation as needed.   Driving restrictions   Complete by: As directed     No driving while taking narcotic pain meds.   Incentive spirometry RT   Complete by: As directed    Increase activity slowly as tolerated   Complete by: As directed        Follow-up Information    Schedule an appointment as soon as possible for a visit with Marybelle Killings, MD.   Specialty: Orthopedic Surgery Why: need return office visit with dr yates one week postop.  call to schedule appointment to be seen by him.  Contact information: [address] [place] [address] [phone]                Signed: Eduard Roux 04/18/2020, 7:06 AM

## 2020-04-22 ENCOUNTER — Encounter (HOSPITAL_COMMUNITY): Payer: Self-pay | Admitting: Orthopaedic Surgery

## 2020-04-24 ENCOUNTER — Encounter: Payer: Self-pay | Admitting: Orthopaedic Surgery

## 2020-04-24 ENCOUNTER — Other Ambulatory Visit: Payer: Self-pay

## 2020-04-24 ENCOUNTER — Ambulatory Visit (INDEPENDENT_AMBULATORY_CARE_PROVIDER_SITE_OTHER): Payer: PRIVATE HEALTH INSURANCE

## 2020-04-24 ENCOUNTER — Ambulatory Visit (INDEPENDENT_AMBULATORY_CARE_PROVIDER_SITE_OTHER): Payer: PRIVATE HEALTH INSURANCE | Admitting: Orthopaedic Surgery

## 2020-04-24 VITALS — BP 99/68 | HR 60 | Ht 67.0 in | Wt 139.0 lb

## 2020-04-24 DIAGNOSIS — Z981 Arthrodesis status: Secondary | ICD-10-CM

## 2020-04-24 NOTE — Progress Notes (Signed)
   Post-Op Visit Note   Patient: Marvin Macias           Date of Birth: 03/19/1971           MRN: [id number] Visit Date: 04/24/2020 PCP: Campbell Riches, MD   Assessment & Plan: Post single level fusion C6-7 he still has some pain medication left.  Work slip given no work for 5 more weeks recheck 5 weeks.  Flexion-extension lateral x-ray on return.  Chief Complaint:  Chief Complaint  Patient presents with  . Neck - Routine Post Op    04/17/2020 C5-6, C6-7 ACDF   Visit Diagnoses:  1. Status post cervical spinal fusion     Plan: ROV 5 wks. xrays as above.   Follow-Up Instructions: No follow-ups on file.   Orders:  Orders Placed This Encounter  Procedures  . XR Cervical Spine 2 or 3 views   No orders of the defined types were placed in this encounter.   Imaging: No results found.  PMFS History: Patient Active Problem List   Diagnosis Date Noted  . Cervical spinal stenosis 04/17/2020  . Protrusion of cervical intervertebral disc 03/18/2020  . Neural foraminal stenosis of cervical spine 02/13/2020  . Neck pain, bilateral 11/28/2019  . Radicular pain in right arm 11/28/2019  . Radicular pain in left arm 11/28/2019  . Pain of cervical spine 11/28/2019  . Chronic right shoulder pain 09/23/2019  . HTN (hypertension) 04/20/2016  . Allergic sinusitis 12/09/2013  . Skin cancer, basal cell 04/16/2013  . TOBACCO USER 02/05/2007  . 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 DIARRHEA 10/11/2006  . Human immunodeficiency virus (HIV) disease ([place]) 08/09/2006  . Anxiety state 08/09/2006  . DEPRESSION 08/09/2006   Past Medical History:  Diagnosis Date  . Anxiety   . Depression   . Family history of adverse reaction to anesthesia    Mother had vomiting  . HIV infection ([place])   . Hypertension     Family History  Problem Relation Age of Onset  . Diabetes Father     Past Surgical History:  Procedure Laterality Date  . ANTERIOR CERVICAL DECOMP/DISCECTOMY FUSION N/A 04/17/2020    Procedure: CERVICAL SIX- CERVICAL SEVEN  ANTERIOR CERVICAL DECOMPRESSION/DISCECTOMY FUSION, ALLOGRAFT, PLATE;  Surgeon: Marybelle Killings, MD;  Location: [facility] [place];  Service: Orthopedics;  Laterality: N/A;  . APPENDECTOMY  1998  . HAND SURGERY Right 2005   Social History   Occupational History  . Not on file  Tobacco Use  . Smoking status: Current Every Day Smoker    Packs/day: 1.00    Years: 25.00    Pack years: 25.00    Types: Cigarettes  . Smokeless tobacco: Never Used  . Tobacco comment: cutting back  Vaping Use  . Vaping Use: Never used  Substance and Sexual Activity  . Alcohol use: Yes    Alcohol/week: 5.0 standard drinks    Types: 5 Shots of liquor per week    Comment: vodka  . Drug use: Yes    Frequency: 4.0 times per week    Types: Marijuana  . Sexual activity: Yes    Partners: Male    Comment: pt. given condoms

## 2020-05-04 ENCOUNTER — Encounter: Payer: Self-pay | Admitting: Orthopaedic Surgery

## 2020-05-25 ENCOUNTER — Other Ambulatory Visit: Payer: Self-pay | Admitting: Infectious Diseases

## 2020-05-25 DIAGNOSIS — R197 Diarrhea, unspecified: Secondary | ICD-10-CM

## 2020-05-25 DIAGNOSIS — B029 Zoster without complications: Secondary | ICD-10-CM

## 2020-05-26 ENCOUNTER — Other Ambulatory Visit: Payer: Self-pay | Admitting: Infectious Diseases

## 2020-05-26 DIAGNOSIS — R197 Diarrhea, unspecified: Secondary | ICD-10-CM

## 2020-05-27 ENCOUNTER — Ambulatory Visit (INDEPENDENT_AMBULATORY_CARE_PROVIDER_SITE_OTHER): Payer: Worker's Compensation | Admitting: Orthopaedic Surgery

## 2020-05-27 ENCOUNTER — Encounter: Payer: Self-pay | Admitting: Orthopaedic Surgery

## 2020-05-27 ENCOUNTER — Ambulatory Visit (INDEPENDENT_AMBULATORY_CARE_PROVIDER_SITE_OTHER): Payer: Worker's Compensation

## 2020-05-27 VITALS — Ht 67.0 in | Wt 139.0 lb

## 2020-05-27 DIAGNOSIS — Z981 Arthrodesis status: Secondary | ICD-10-CM

## 2020-05-27 NOTE — Progress Notes (Signed)
Post-Op Visit Note   Patient: Marvin Macias           Date of Birth: 1971-03-17           MRN: [id number] Visit Date: 05/27/2020 PCP: Campbell Riches, MD   Assessment & Plan: Discontinue collar resume work on Monday.  Follow-up as needed. Chief Complaint:  Chief Complaint  Patient presents with  . Neck - Follow-up    04/17/2020 C6-7 ACDF   Visit Diagnoses:  1. Status post cervical spinal fusion     Plan: Patient returns postC6-7 single level ACDF.  No motion on flexion-extension x-rays.  Discontinue collar he can resume regular work.  Patient asked about impairment rating and states he originally reported his injury to his employer.  He states his insurance covered the cost of the surgery.  I discussed with him that if is consider Worker's Comp. then impairment rating would be in line with [place] impairment rating guide 1 level cervical fusion.  Patient is happy with results of surgery can discontinue the collar and resume regular work on 06/01/2020 without restrictions.  Follow-Up Instructions: Return if symptoms worsen or fail to improve.   Orders:  Orders Placed This Encounter  Procedures  . XR Cervical Spine 2 or 3 views   No orders of the defined types were placed in this encounter.   Imaging: XR Cervical Spine 2 or 3 views  Result Date: 05/27/2020 Lateral flexion-extension x-rays AP x-ray cervical spine demonstrates single level C6-7 cervical fusion without motion and no evidence of screw loosening. Impression: C6-7 single level fusion without motion.   PMFS History: Patient Active Problem List   Diagnosis Date Noted  . S/P cervical spinal fusion 04/24/2020  . Cervical spinal stenosis 04/17/2020  . Neck pain, bilateral 11/28/2019  . Radicular pain in right arm 11/28/2019  . Radicular pain in left arm 11/28/2019  . Pain of cervical spine 11/28/2019  . Chronic right shoulder pain 09/23/2019  . HTN (hypertension) 04/20/2016  . Allergic sinusitis  12/09/2013  . Skin cancer, basal cell 04/16/2013  . TOBACCO USER 02/05/2007  . 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 DIARRHEA 10/11/2006  . Human immunodeficiency virus (HIV) disease ([place]) 08/09/2006  . Anxiety state 08/09/2006  . DEPRESSION 08/09/2006   Past Medical History:  Diagnosis Date  . Anxiety   . Depression   . Family history of adverse reaction to anesthesia    Mother had vomiting  . HIV infection ([place])   . Hypertension     Family History  Problem Relation Age of Onset  . Diabetes Father     Past Surgical History:  Procedure Laterality Date  . ANTERIOR CERVICAL DECOMP/DISCECTOMY FUSION N/A 04/17/2020   Procedure: CERVICAL SIX- CERVICAL SEVEN  ANTERIOR CERVICAL DECOMPRESSION/DISCECTOMY FUSION, ALLOGRAFT, PLATE;  Surgeon: Marybelle Killings, MD;  Location: [facility];  Service: Orthopedics;  Laterality: N/A;  . APPENDECTOMY  1998  . HAND SURGERY Right 2005   Social History   Occupational History  . Not on file  Tobacco Use  . Smoking status: Current Every Day Smoker    Packs/day: 1.00    Years: 25.00    Pack years: 25.00    Types: Cigarettes  . Smokeless tobacco: Never Used  . Tobacco comment: cutting back  Vaping Use  . Vaping Use: Never used  Substance and Sexual Activity  . Alcohol use: Yes    Alcohol/week: 5.0 standard drinks    Types: 5 Shots of liquor per week    Comment:  vodka  . Drug use: Yes    Frequency: 4.0 times per week    Types: Marijuana  . Sexual activity: Yes    Partners: Male    Comment: pt. given condoms

## 2020-06-25 NOTE — Telephone Encounter (Signed)
Patient called concerned about diarrhea. Reports that he has not tried any over the counter medication. Instructed patient to try imodium however if he's concerned he should be assessed at an urgent care due to acuity of problem.   Cyndal Kasson Lorita Officer, RN

## 2020-07-21 ENCOUNTER — Ambulatory Visit: Payer: PRIVATE HEALTH INSURANCE

## 2020-07-30 ENCOUNTER — Ambulatory Visit: Payer: PRIVATE HEALTH INSURANCE

## 2020-07-30 ENCOUNTER — Other Ambulatory Visit: Payer: Self-pay

## 2020-08-10 ENCOUNTER — Other Ambulatory Visit: Payer: Self-pay | Admitting: Infectious Diseases

## 2020-08-10 DIAGNOSIS — I10 Essential (primary) hypertension: Secondary | ICD-10-CM

## 2020-08-11 ENCOUNTER — Telehealth: Payer: Self-pay

## 2020-08-11 NOTE — Telephone Encounter (Signed)
PA approved for patient Marvin Macias 08/11/20-08/11/21 through [facility]  ID # [id number]  Approval faxed to Caroline in [place]. Marvin Macias T Brooks Sailors

## 2020-09-09 ENCOUNTER — Other Ambulatory Visit: Payer: Self-pay | Admitting: Infectious Diseases

## 2020-09-09 DIAGNOSIS — I10 Essential (primary) hypertension: Secondary | ICD-10-CM

## 2020-09-09 DIAGNOSIS — B20 Human immunodeficiency virus [HIV] disease: Secondary | ICD-10-CM

## 2020-09-18 ENCOUNTER — Other Ambulatory Visit: Payer: Self-pay

## 2020-09-18 DIAGNOSIS — Z79899 Other long term (current) drug therapy: Secondary | ICD-10-CM

## 2020-09-18 DIAGNOSIS — Z113 Encounter for screening for infections with a predominantly sexual mode of transmission: Secondary | ICD-10-CM

## 2020-09-18 DIAGNOSIS — B20 Human immunodeficiency virus [HIV] disease: Secondary | ICD-10-CM

## 2020-09-24 ENCOUNTER — Other Ambulatory Visit: Payer: No Typology Code available for payment source

## 2020-09-24 ENCOUNTER — Other Ambulatory Visit: Payer: Self-pay

## 2020-09-24 ENCOUNTER — Other Ambulatory Visit (HOSPITAL_COMMUNITY)
Admission: RE | Admit: 2020-09-24 | Discharge: 2020-09-24 | Disposition: A | Discharge status: Home / Self Care | Payer: No Typology Code available for payment source | Source: Ambulatory Visit | Attending: Infectious Diseases | Admitting: Infectious Diseases

## 2020-09-24 DIAGNOSIS — Z113 Encounter for screening for infections with a predominantly sexual mode of transmission: Secondary | ICD-10-CM

## 2020-09-24 DIAGNOSIS — B20 Human immunodeficiency virus [HIV] disease: Secondary | ICD-10-CM

## 2020-09-24 DIAGNOSIS — Z79899 Other long term (current) drug therapy: Secondary | ICD-10-CM

## 2020-09-25 LAB — URINE CYTOLOGY ANCILLARY ONLY
Chlamydia: NEGATIVE
Comment: NEGATIVE
Comment: NORMAL
Neisseria Gonorrhea: NEGATIVE

## 2020-09-25 LAB — T-HELPER CELL (CD4) - (RCID CLINIC ONLY)
CD4 % Helper T Cell: 40 % (ref 33–65)
CD4 T Cell Abs: 712 /uL (ref 400–1790)

## 2020-09-28 LAB — COMPLETE METABOLIC PANEL WITH GFR
AG Ratio: 2.4 (calc) (ref 1.0–2.5)
ALT: 13 U/L (ref 9–46)
AST: 17 U/L (ref 10–35)
Albumin: 4.5 g/dL (ref 3.6–5.1)
Alkaline phosphatase (APISO): 52 U/L (ref 35–144)
BUN: 10 mg/dL (ref 7–25)
CO2: 24 mmol/L (ref 20–32)
Calcium: 9.4 mg/dL (ref 8.6–10.3)
Chloride: 103 mmol/L (ref 98–110)
Creat: 0.81 mg/dL (ref 0.70–1.33)
GFR, Est African American: 118 mL/min/{1.73_m2} (ref >=60)
GFR, Est Non African American: 102 mL/min/{1.73_m2} (ref >=60)
Globulin: 1.9 g/dL (calc) (ref 1.9–3.7)
Glucose, Bld: 82 mg/dL (ref 65–99)
Potassium: 3.9 mmol/L (ref 3.5–5.3)
Sodium: 138 mmol/L (ref 135–146)
Total Bilirubin: 0.6 mg/dL (ref 0.2–1.2)
Total Protein: 6.4 g/dL (ref 6.1–8.1)

## 2020-09-28 LAB — CBC WITH DIFFERENTIAL/PLATELET
Absolute Monocytes: 751 cells/uL (ref 200–950)
Basophils Absolute: 57 cells/uL (ref 0–200)
Basophils Relative: 0.6 %
Eosinophils Absolute: 57 cells/uL (ref 15–500)
Eosinophils Relative: 0.6 %
HCT: 45.4 % (ref 38.5–50.0)
Hemoglobin: 15.9 g/dL (ref 13.2–17.1)
Lymphs Abs: 1948 cells/uL (ref 850–3900)
MCH: 37.2 pg — ABNORMAL HIGH (ref 27.0–33.0)
MCHC: 35 g/dL (ref 32.0–36.0)
MCV: 106.3 fL — ABNORMAL HIGH (ref 80.0–100.0)
MPV: 10.4 fL (ref 7.5–12.5)
Monocytes Relative: 7.9 %
Neutro Abs: 6688 cells/uL (ref 1500–7800)
Neutrophils Relative %: 70.4 %
Platelets: 240 10*3/uL (ref 140–400)
RBC: 4.27 10*6/uL (ref 4.20–5.80)
RDW: 12.5 % (ref 11.0–15.0)
Total Lymphocyte: 20.5 %
WBC: 9.5 10*3/uL (ref 3.8–10.8)

## 2020-09-28 LAB — HIV-1 RNA QUANT-NO REFLEX-BLD
HIV 1 RNA Quant: NOT DETECTED Copies/mL
HIV-1 RNA Quant, Log: NOT DETECTED Log cps/mL

## 2020-09-28 LAB — LIPID PANEL
Cholesterol: 187 mg/dL (ref <200)
HDL: 54 mg/dL (ref >=40)
LDL Cholesterol (Calc): 107 mg/dL (calc) — ABNORMAL HIGH
Non-HDL Cholesterol (Calc): 133 mg/dL (calc) — ABNORMAL HIGH (ref <130)
Total CHOL/HDL Ratio: 3.5 (calc) (ref <5.0)
Triglycerides: 146 mg/dL (ref <150)

## 2020-09-28 LAB — RPR: RPR Ser Ql: NONREACTIVE

## 2020-10-08 ENCOUNTER — Other Ambulatory Visit: Payer: Self-pay

## 2020-10-08 ENCOUNTER — Other Ambulatory Visit: Payer: Self-pay | Admitting: Infectious Diseases

## 2020-10-08 ENCOUNTER — Encounter: Payer: Self-pay | Admitting: Infectious Diseases

## 2020-10-08 ENCOUNTER — Ambulatory Visit: Payer: PRIVATE HEALTH INSURANCE | Admitting: Infectious Diseases

## 2020-10-08 VITALS — BP 105/69 | HR 87 | Temp 97.9°F | Wt 141.0 lb

## 2020-10-08 DIAGNOSIS — Z1211 Encounter for screening for malignant neoplasm of colon: Secondary | ICD-10-CM

## 2020-10-08 DIAGNOSIS — Z23 Encounter for immunization: Secondary | ICD-10-CM | POA: Diagnosis not present

## 2020-10-08 DIAGNOSIS — R197 Diarrhea, unspecified: Secondary | ICD-10-CM

## 2020-10-08 DIAGNOSIS — I10 Essential (primary) hypertension: Secondary | ICD-10-CM | POA: Diagnosis not present

## 2020-10-08 DIAGNOSIS — I158 Other secondary hypertension: Secondary | ICD-10-CM

## 2020-10-08 DIAGNOSIS — A630 Anogenital (venereal) warts: Secondary | ICD-10-CM

## 2020-10-08 DIAGNOSIS — B20 Human immunodeficiency virus [HIV] disease: Secondary | ICD-10-CM

## 2020-10-08 DIAGNOSIS — F329 Major depressive disorder, single episode, unspecified: Secondary | ICD-10-CM

## 2020-10-08 DIAGNOSIS — B029 Zoster without complications: Secondary | ICD-10-CM

## 2020-10-08 MED ORDER — IMIQUIMOD 5 % EX CREA: TOPICAL_CREAM | CUTANEOUS | 3 refills | Status: DC

## 2020-10-08 MED ORDER — ODEFSEY 200-25-25 MG PO TABS: 1.0000 | ORAL_TABLET | Freq: Every day | ORAL | 11 refills | Status: DC

## 2020-10-08 MED ORDER — VALSARTAN-HYDROCHLOROTHIAZIDE 80-12.5 MG PO TABS: 1.0000 | ORAL_TABLET | Freq: Every day | ORAL | 11 refills | Status: DC

## 2020-10-08 NOTE — Assessment & Plan Note (Signed)
Longstanding problem that has acutely worsened due to work environment and increased stress.  He is looking forward to a potential break after Easter when work slows down a little bit.  He ultimately would like to switch his job but is not at a point where he can do that at the moment.  We discussed reducing his alcohol use and looking to other activities like daily walking, spending time with his dog, more time with family/friends he trusts and enjoys being around.  He is not open or interested in enrolling in care with a psychiatrist or psychologist at the moment but offered referral.

## 2020-10-08 NOTE — Progress Notes (Signed)
Name: Marvin Macias  DOB: Jun 02, 1971 MRN: [id number] PCP: Pcp, No     Brief Narrative:  Marvin Macias is a 50 y.o. male with HIV disease, Dx 2008. History of OIs: none known. HIV RIsk: MSM   Previous Regimens: . Atripla  . Stribild  . Odefsey >> suppressed   Genotypes: . Sensitive   Subjective:  CC:  HIV follow up  Increased stress and depression. New wart on perineum   HPI: Marvin Macias is here for 1 year follow-up.  Since her last office visit he has had a fusion of the C-spine continues to work with orthopedic surgery.  Has not had much improvement in his symptoms continues to have numbness and tingling especially down the left arm.  He has decided to stop taking the gabapentin as he felt like he was having worsening diarrhea related to this.  Since stopping it seems to have improved.  Continues to take [facility] once daily with food as instructed.  No missed doses or concerns for side effects to the medication.  No new partners encounters.  He actually discusses that he is having some trouble with erectile dysfunction and is hopeful he can get off of his blood pressure medications to see if this helps.  He has had increased stress and depression related to his current position at work.  Work environment is challenging for him and causes a lot of stress.  He has dealt with this by increasing alcohol intake and smoking marijuana.  He is not sleeping well and struggles with anger.  He has worked with counselors in the past and did not find to be effective.  He did have one psychiatrist when he was younger that was helpful to calm him but he is not to the point where he would like to consider enrolling in care with the person.  He does find walking helpful especially with his dog.  He is a new problem with what he believes to be a wart that has come up to the perineum.  It does not itch burn or cause irritation but he can feel it.  Feels like this has been there over a month now, not much in  regarding changing of size or shape or texture that he can tell.  He has not tried anything over-the-counter for this problem.    Review of Systems  Constitutional: Negative for chills and fever.  HENT: Negative for tinnitus.   Eyes: Negative for blurred vision and photophobia.  Respiratory: Negative for cough and sputum production.   Cardiovascular: Negative for chest pain.  Gastrointestinal: Negative for abdominal pain, diarrhea, nausea and vomiting.  Genitourinary: Negative for dysuria.  Skin: Positive for rash.  Neurological: Positive for tingling. Negative for headaches.  Psychiatric/Behavioral: Positive for depression and substance abuse. The patient has insomnia.     Past Medical History:  Diagnosis Date  . Anxiety   . Depression   . Family history of adverse reaction to anesthesia    Mother had vomiting  . HIV infection ([place])   . Hypertension     Outpatient Medications Prior to Visit  Medication Sig Dispense Refill  . MYTESI 125 MG TBEC TAKE 1 TABLET BY MOUTH TWICE DAILY AT 10 AM AND AT 5 PM 60 tablet 3  . nicotine (NICODERM CQ - DOSED IN MG/24 HOURS) 14 mg/24hr patch Place 1 patch (14 mg total) onto the skin daily. 7 patch 0  . oxyCODONE-acetaminophen (PERCOCET/ROXICET) 5-325 MG tablet Take 1 tablet by mouth every 4 (  four) hours as needed for severe pain. 40 tablet 0  . ODEFSEY 200-25-25 MG TABS tablet TAKE 1 TABLET BY MOUTH DAILY WITH BREAKFAST 30 tablet 0  . valsartan-hydrochlorothiazide (DIOVAN-HCT) 80-12.5 MG tablet TAKE 1 TABLET BY MOUTH DAILY 30 tablet 0  . gabapentin (NEURONTIN) 100 MG capsule TAKE 1 CAPSULE(100 MG) BY MOUTH TWICE DAILY (Patient not taking: Reported on 10/08/2020) 60 capsule 0  . methocarbamol (ROBAXIN) 500 MG tablet Take 1 tablet (500 mg total) by mouth every 6 (six) hours as needed for muscle spasms. (Patient not taking: Reported on 10/08/2020) 50 tablet 0  . valACYclovir (VALTREX) 1000 MG tablet TAKE 1 TABLET(1000 MG) BY MOUTH TWICE DAILY (Patient  not taking: Reported on 10/08/2020) 60 tablet 3   No facility-administered medications prior to visit.     Allergies  Allergen Reactions  . Codeine Nausea And Vomiting  . Erythromycin Swelling  . Hydrocodone-Acetaminophen Nausea And Vomiting  . Latex Itching    Burning  . Niacin Swelling  . Penicillins Hives    Social History   Tobacco Use  . Smoking status: Current Every Day Smoker    Packs/day: 1.00    Years: 25.00    Pack years: 25.00    Types: Cigarettes  . Smokeless tobacco: Never Used  . Tobacco comment: cutting back  Vaping Use  . Vaping Use: Never used  Substance Use Topics  . Alcohol use: Yes    Alcohol/week: 5.0 standard drinks    Types: 5 Shots of liquor per week    Comment: vodka  . Drug use: Yes    Frequency: 4.0 times per week    Types: Marijuana    Social History   Substance and Sexual Activity  Sexual Activity Yes  . Partners: Male   Comment: declined condoms      Objective:   Vitals:   10/08/20 0848  BP: 105/69  Pulse: 87  Temp: 97.9 F (36.6 C)  TempSrc: Oral  Weight: 141 lb (64 kg)   Body mass index is 22.08 kg/m.  Physical Exam Vitals reviewed. Exam conducted with a chaperone present.  Constitutional:      Appearance: Normal appearance. He is not ill-appearing.  HENT:     Head: Normocephalic.     Mouth/Throat:     Mouth: Mucous membranes are moist.     Pharynx: Oropharynx is clear.  Eyes:     General: No scleral icterus. Cardiovascular:     Rate and Rhythm: Normal rate and regular rhythm.  Pulmonary:     Effort: Pulmonary effort is normal.  Genitourinary:    Comments: There is a small < 0.5 cm raised hyperpigmented nodule on the perineum. He has a flat macule adjacent to it that appears different. Musculoskeletal:        General: Normal range of motion.     Cervical back: Normal range of motion.  Skin:    Coloration: Skin is not jaundiced or pale.  Neurological:     Mental Status: He is alert and oriented to person,  place, and time.  Psychiatric:        Mood and Affect: Mood normal.        Judgment: Judgment normal.     Lab Results Lab Results  Component Value Date   WBC 9.5 09/24/2020   HGB 15.9 09/24/2020   HCT 45.4 09/24/2020   MCV 106.3 (H) 09/24/2020   PLT 240 09/24/2020    Lab Results  Component Value Date   CREATININE 0.81 09/24/2020   BUN  10 09/24/2020   NA 138 09/24/2020   K 3.9 09/24/2020   CL 103 09/24/2020   CO2 24 09/24/2020    Lab Results  Component Value Date   ALT 13 09/24/2020   AST 17 09/24/2020   ALKPHOS 43 04/08/2020   BILITOT 0.6 09/24/2020    Lab Results  Component Value Date   CHOL 187 09/24/2020   HDL 54 09/24/2020   LDLCALC 107 (H) 09/24/2020   TRIG 146 09/24/2020   CHOLHDL 3.5 09/24/2020   HIV 1 RNA Quant  Date Value  09/24/2020 Not Detected Copies/mL  08/13/2019 26 copies/mL (H)  08/06/2018 69 copies/mL (H)   CD4 T Cell Abs (/uL)  Date Value  09/24/2020 712  08/13/2019 998  08/06/2018 1,290     Assessment & Plan:   Patient Active Problem List   Diagnosis Date Noted  . S/P cervical spinal fusion 04/24/2020  . Cervical spinal stenosis 04/17/2020  . Neck pain, bilateral 11/28/2019  . Radicular pain in right arm 11/28/2019  . Radicular pain in left arm 11/28/2019  . Pain of cervical spine 11/28/2019  . Chronic right shoulder pain 09/23/2019  . HTN (hypertension) 04/20/2016  . Allergic sinusitis 12/09/2013  . Skin cancer, basal cell 04/16/2013  . TOBACCO USER 02/05/2007  . 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 DIARRHEA 10/11/2006  . Human immunodeficiency virus (HIV) disease ([place]) 08/09/2006  . Anxiety state 08/09/2006  . Depression 08/09/2006     Problem List Items Addressed This Visit      Unprioritized   Human immunodeficiency virus (HIV) disease ([place]) (Chronic)    Viral loads undetectable with a healthy recovered CD4 count maintained on once daily Odefsey.  He is taking this correctly with food and there is no interacting  medications noted.  We briefly discussed some of the newer pills but he elected to keep current treatment.  Declined STI screen today.  He is up-to-date on his vaccinations and will receive the second [place] booster today.  Will refer to GI for colonoscopy screening.  No dental needs currently.      Relevant Medications   imiquimod (ALDARA) 5 % cream (Start on 10/09/2020)   emtricitabine-rilpivir-tenofovir AF (ODEFSEY) 200-25-25 MG TABS tablet   HTN (hypertension)    BP Readings from Last 3 Encounters:  10/08/20 105/69  04/24/20 99/68  04/18/20 116/86   At follow-up in 4 months we will look at reducing/stopping current blood pressure medication.  He has had borderline lower blood pressure back in October 2021 and today only 105/69.      Relevant Medications   valsartan-hydrochlorothiazide (DIOVAN-HCT) 80-12.5 MG tablet   Depression    Longstanding problem that has acutely worsened due to work environment and increased stress.  He is looking forward to a potential break after Easter when work slows down a little bit.  He ultimately would like to switch his job but is not at a point where he can do that at the moment.  We discussed reducing his alcohol use and looking to other activities like daily walking, spending time with his dog, more time with family/friends he trusts and enjoys being around.  He is not open or interested in enrolling in care with a psychiatrist or psychologist at the moment but offered referral.      Anal condyloma    Emanual has a history of anal condyloma documented in the past.  Single lesion noted that is slightly hyperpigmented with irregular raised texture.  We will start imiquimod cream 3 times a  week and have him return in 4 months for follow-up.  I asked him to please call and let me know if this was not improving after a few months or it began to change/grow in size.  Will need referral to dermatology for excisional biopsy.      Relevant Medications   imiquimod  (ALDARA) 5 % cream (Start on 10/09/2020)   emtricitabine-rilpivir-tenofovir AF (ODEFSEY) 200-25-25 MG TABS tablet    Other Visit Diagnoses    Colon cancer screening    -  Primary   Relevant Orders   Ambulatory referral to Gastroenterology   Essential hypertension       Relevant Medications   valsartan-hydrochlorothiazide (DIOVAN-HCT) 80-12.5 MG tablet   Encounter for immunization       Relevant Orders   PFIZER Comirnaty(GRAY TOP)COVID-19 Vaccine (Completed)      Janene Madeira, MSN, NP-C [facility] Pager: [phone] Office: [phone]  10/08/20  9:56 PM

## 2020-10-08 NOTE — Assessment & Plan Note (Signed)
BP Readings from Last 3 Encounters:  10/08/20 105/69  04/24/20 99/68  04/18/20 116/86   At follow-up in 4 months we will look at reducing/stopping current blood pressure medication.  He has had borderline lower blood pressure back in October 2021 and today only 105/69.

## 2020-10-08 NOTE — Progress Notes (Signed)
   Covid-19 Vaccination Clinic  Name:  Marvin Macias    MRN: [id number] DOB: 1971/05/23  10/08/2020  [name] was observed post Covid-19 immunization for 15 minutes without incident. He was provided with Vaccine Information Sheet and instruction to access the V-Safe system.   [name] was instructed to call 911 with any severe reactions post vaccine: Marland Kitchen Difficulty breathing  . Swelling of face and throat  . A fast heartbeat  . A bad rash all over body  . Dizziness and weakness     [name]

## 2020-10-08 NOTE — Assessment & Plan Note (Addendum)
Viral loads undetectable with a healthy recovered CD4 count maintained on once daily Odefsey.  He is taking this correctly with food and there is no interacting medications noted.  We briefly discussed some of the newer pills but he elected to keep current treatment.  Declined STI screen today.  He is up-to-date on his vaccinations and will receive the second [place] booster today.  Will refer to GI for colonoscopy screening.  No dental needs currently.

## 2020-10-08 NOTE — Patient Instructions (Signed)
Nice to see you!  I am hopeful you can get a break after Easter for a break and stress relief.  I would like to see you taking more walks outside for stress relief - ever 15-30 minutes once a day can be very helpful.   Start the cream 3 times a week - put it on overnight in a thin layer and then wash off in the morning. If the spot grows larger or has no improvement I would like to send you to a dermatologist to take a look at it. It is not completely typical of a wart and may be more of a mole.   Please come back in 3-4 months and we can check in on the cream's effect, and hopefully decrease your blood pressure medication.

## 2020-10-08 NOTE — Assessment & Plan Note (Signed)
Marvin Macias has a history of anal condyloma documented in the past.  Single lesion noted that is slightly hyperpigmented with irregular raised texture.  We will start imiquimod cream 3 times a week and have him return in 4 months for follow-up.  I asked him to please call and let me know if this was not improving after a few months or it began to change/grow in size.  Will need referral to dermatology for excisional biopsy.

## 2020-10-09 ENCOUNTER — Other Ambulatory Visit: Payer: Self-pay | Admitting: *Deleted

## 2020-10-09 DIAGNOSIS — B029 Zoster without complications: Secondary | ICD-10-CM

## 2020-10-09 DIAGNOSIS — R197 Diarrhea, unspecified: Secondary | ICD-10-CM

## 2020-10-09 MED ORDER — VALACYCLOVIR HCL 1 G PO TABS: ORAL_TABLET | ORAL | 3 refills | Status: DC

## 2020-10-09 MED ORDER — MYTESI 125 MG PO TBEC: DELAYED_RELEASE_TABLET | ORAL | 3 refills | Status: DC

## 2020-10-09 NOTE — Progress Notes (Signed)
[facility] called for refills of Mytesi and Valtrex. Sent. [name], RN

## 2020-12-11 ENCOUNTER — Telehealth: Payer: Self-pay | Admitting: *Deleted

## 2020-12-11 NOTE — Telephone Encounter (Signed)
Patient called. He spoke with [facility] regarding his referral for colonoscopy, was told that [facility] does not take his [employer] and to call back to the referring provider. Will route to [facility], referral coordinator, for assistance in finding GI provider who takes patient's insurance. [name], RN

## 2021-01-07 ENCOUNTER — Ambulatory Visit: Payer: PRIVATE HEALTH INSURANCE | Admitting: Infectious Diseases

## 2021-01-09 ENCOUNTER — Telehealth: Payer: Self-pay | Admitting: Internal Medicine

## 2021-01-09 ENCOUNTER — Other Ambulatory Visit: Payer: Self-pay | Admitting: Internal Medicine

## 2021-01-09 MED ORDER — MOLNUPIRAVIR EUA 200MG CAPSULE: 4.0000 | ORAL_CAPSULE | Freq: Two times a day (BID) | ORAL | 0 refills | Status: AC

## 2021-01-09 NOTE — Telephone Encounter (Signed)
Patient reports having nasal congestion, chills x 2 days, did at home covid ag test which was +. Given past med hx, he would be candidate for oral antivirals. Sent in rx for molnupiravir plus have patient self isolate for at least 5 days, 10 days if possible.

## 2021-01-15 ENCOUNTER — Telehealth: Payer: Self-pay

## 2021-01-15 NOTE — Telephone Encounter (Signed)
Patient called to ask when he will test negative after treated for Covid. Advised that in theory it is 10 days after the onset of symptoms that he should be out of the contagious window but everyone tests different. He tested on day 8 so I advised him to wait a few days and test again. He also reports he is fatigue and still stuffy.

## 2021-01-26 ENCOUNTER — Other Ambulatory Visit: Payer: Self-pay | Admitting: Infectious Diseases

## 2021-01-26 DIAGNOSIS — B029 Zoster without complications: Secondary | ICD-10-CM

## 2021-01-26 DIAGNOSIS — R197 Diarrhea, unspecified: Secondary | ICD-10-CM

## 2021-01-26 NOTE — Telephone Encounter (Signed)
Refills provided for [facility] and Valtrex  Next fill we can do both for 1 year.

## 2021-01-26 NOTE — Telephone Encounter (Signed)
Please advise on Mytesi refill.

## 2021-02-02 DIAGNOSIS — A630 Anogenital (venereal) warts: Secondary | ICD-10-CM

## 2021-02-02 MED ORDER — IMIQUIMOD 5 % EX CREA: TOPICAL_CREAM | CUTANEOUS | 0 refills | Status: DC

## 2021-02-22 ENCOUNTER — Encounter: Payer: Self-pay | Admitting: Infectious Diseases

## 2021-02-22 ENCOUNTER — Ambulatory Visit (INDEPENDENT_AMBULATORY_CARE_PROVIDER_SITE_OTHER): Payer: No Typology Code available for payment source | Admitting: Infectious Diseases

## 2021-02-22 ENCOUNTER — Other Ambulatory Visit: Payer: Self-pay

## 2021-02-22 ENCOUNTER — Ambulatory Visit: Payer: No Typology Code available for payment source

## 2021-02-22 VITALS — BP 103/72 | HR 91 | Temp 98.3°F | Wt 140.0 lb

## 2021-02-22 DIAGNOSIS — A630 Anogenital (venereal) warts: Secondary | ICD-10-CM | POA: Diagnosis not present

## 2021-02-22 DIAGNOSIS — I10 Essential (primary) hypertension: Secondary | ICD-10-CM | POA: Diagnosis not present

## 2021-02-22 DIAGNOSIS — I158 Other secondary hypertension: Secondary | ICD-10-CM | POA: Diagnosis not present

## 2021-02-22 DIAGNOSIS — M4802 Spinal stenosis, cervical region: Secondary | ICD-10-CM

## 2021-02-22 MED ORDER — DULOXETINE HCL 30 MG PO CPEP: ORAL_CAPSULE | ORAL | 0 refills | Status: DC

## 2021-02-22 MED ORDER — DULOXETINE HCL 60 MG PO CPEP: 60.0000 mg | ORAL_CAPSULE | Freq: Every day | ORAL | 3 refills | Status: DC

## 2021-02-22 NOTE — Assessment & Plan Note (Signed)
S/P surgical correction. Still with ongoing numbness/tingling in UEs. Intolerant to Gabapentin d/t GI s/e. Will try Cymbalta once daily. Start 30 mg QD x 14 days then increase to 60 mg QD. Requested 31mfollow up due to work - he has refills to continue the '60mg'$  daily dose until late October at his return. I asked him to MyChart me with any updates in between.

## 2021-02-22 NOTE — Patient Instructions (Addendum)
STOP the blood pressure medication (yay!)  STOP Gabapentin   START Cymbalta one pill once a day (30 mg) for two weeks. THEN take TWO pills (60 mg) until you come back to see me. I am hopeful this will help with the nerve pain.   Will send in a referral for you to see general surgery   Please come back in 3 months to check in with your new medication a your blood pressure check.

## 2021-02-22 NOTE — Assessment & Plan Note (Signed)
BP Readings from Last 3 Encounters:  02/22/21 103/72  10/08/20 105/69  04/24/20 99/68   Will stop his BP medication today given low BP readings the last few checks. FU in 60moff medication.

## 2021-02-22 NOTE — Progress Notes (Signed)
Name: Marvin Macias  DOB: May 24, 1971 MRN: [id number] PCP: Pcp, No     Brief Narrative:  Marvin Macias is a 50 y.o. male with HIV disease, Dx 2008. History of OIs: none known. 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  Since starting the Imiquimod cream in April 2022 - feels like the cream has made it worse and it has gotten bigger.  Its not particularly painful per se but it is irritating and he would like it removed.  He has a colonoscopy coming up soon and wonders if this will take care of that.  He has not been able to use the cream as often as recommended because it does cause a fair amount of irritation to the skin.  Upper arms are painful and numb after his C-spine surgery.  He was started on gabapentin in September 2021.  Unfortunately this has been tough on his stomach and has contributed to some ongoing diarrhea for him.  He is requesting a alternative if I have a recommendation for him today.    Review of Systems  Constitutional:  Negative for chills, fever, malaise/fatigue and weight loss.  HENT:  Negative for sore throat.   Respiratory:  Negative for cough and sputum production.   Cardiovascular:  Negative for chest pain and leg swelling.  Gastrointestinal:  Negative for abdominal pain, diarrhea and vomiting.  Genitourinary:  Negative for dysuria and flank pain.  Musculoskeletal:  Negative for joint pain, myalgias and neck pain.  Skin:  Negative for rash.  Neurological:  Positive for tingling and focal weakness (upper extremeties). Negative for dizziness and headaches.  Psychiatric/Behavioral:  Positive for depression. Negative for substance abuse. The patient is nervous/anxious. The patient does not have insomnia.     Past Medical History:  Diagnosis Date   Anxiety     Depression    Family history of adverse reaction to anesthesia    Mother had vomiting   [place] infection [place]    Hypertension     Outpatient Medications Prior to Visit  Medication Sig Dispense Refill   Crofelemer (MYTESI) 125 MG TBEC TAKE 1 TABLET BY MOUTH TWICE DAILY AT 10AM AND AT 5PM 60 tablet 3   emtricitabine-rilpivir-tenofovir AF (ODEFSEY) 200-25-25 MG TABS tablet Take 1 tablet by mouth daily with breakfast. 30 tablet 11   imiquimod (ALDARA) 5 % cream Apply topically 3 (three) times a week. 12 each 0   nicotine (NICODERM CQ - DOSED IN MG/24 HOURS) 14 mg/24hr patch Place 1 patch (14 mg total) onto the skin daily. 7 patch 0   oxyCODONE-acetaminophen (PERCOCET/ROXICET) 5-325 MG tablet Take 1 tablet by mouth every 4 (four) hours as needed for severe pain. 40 tablet 0   valACYclovir (VALTREX) 1000 MG tablet TAKE 1 TABLET(1000 MG) BY MOUTH TWICE DAILY 60 tablet 3   valsartan-hydrochlorothiazide (DIOVAN-HCT) 80-12.5 MG tablet Take 1 tablet by mouth daily. 30 tablet 11   gabapentin (NEURONTIN) 100 MG capsule TAKE 1 CAPSULE(100 MG) BY MOUTH TWICE DAILY (Patient not taking: No sig reported) 60 capsule 0   No facility-administered medications prior to visit.     Allergies  Allergen Reactions   Codeine Nausea And Vomiting   Erythromycin Swelling   Hydrocodone-Acetaminophen Nausea And Vomiting   Latex Itching  Burning   Niacin Swelling   Penicillins Hives    Social History   Tobacco Use   Smoking status: Every Day    Packs/day: 1.00    Years: 25.00    Pack years: 25.00    Types: Cigarettes   Smokeless tobacco: Never   Tobacco comments:    cutting back  Vaping Use   Vaping Use: Never used  Substance Use Topics   Alcohol use: Yes    Alcohol/week: 5.0 standard drinks    Types: 5 Shots of liquor per week    Comment: vodka   Drug use: Yes    Frequency: 4.0 times per week    Types: Marijuana    Social History   Substance and Sexual Activity  Sexual Activity Yes    Partners: Male   Comment: declined condoms      Objective:   Vitals:   02/22/21 0845  BP: 103/72  Pulse: 91  Temp: 98.3 F (36.8 C)  TempSrc: Oral  SpO2: 98%  Weight: 140 lb (63.5 kg)    Body mass index is 21.93 kg/m.  Physical Exam Constitutional:      Appearance: Normal appearance. He is not ill-appearing.  HENT:     Head: Normocephalic.     Mouth/Throat:     Mouth: Mucous membranes are moist.     Pharynx: Oropharynx is clear.  Eyes:     General: No scleral icterus. Cardiovascular:     Rate and Rhythm: Normal rate and regular rhythm.  Pulmonary:     Effort: Pulmonary effort is normal.  Musculoskeletal:        General: Normal range of motion.     Cervical back: Normal range of motion.  Skin:    Coloration: Skin is not jaundiced or pale.  Neurological:     Mental Status: He is alert and oriented to person, place, and time.  Psychiatric:        Mood and Affect: Mood normal.        Judgment: Judgment normal.    Lab Results Lab Results  Component Value Date   WBC 9.5 09/24/2020   HGB 15.9 09/24/2020   HCT 45.4 09/24/2020   MCV 106.3 (H) 09/24/2020   PLT 240 09/24/2020    Lab Results  Component Value Date   CREATININE 0.81 09/24/2020   BUN 10 09/24/2020   NA 138 09/24/2020   K 3.9 09/24/2020   CL 103 09/24/2020   CO2 24 09/24/2020    Lab Results  Component Value Date   ALT 13 09/24/2020   AST 17 09/24/2020   ALKPHOS 43 04/08/2020   BILITOT 0.6 09/24/2020    Lab Results  Component Value Date   CHOL 187 09/24/2020   HDL 54 09/24/2020   LDLCALC 107 (H) 09/24/2020   TRIG 146 09/24/2020   CHOLHDL 3.5 09/24/2020   HIV 1 RNA Quant  Date Value  09/24/2020 Not Detected Copies/mL  08/13/2019 26 copies/mL (H)  08/06/2018 69 copies/mL (H)   CD4 T Cell Abs (/uL)  Date Value  09/24/2020 712  08/13/2019 998  08/06/2018 1,290     Assessment & Plan:   Patient Active Problem List   Diagnosis Date Noted   S/P cervical spinal fusion 04/24/2020    Cervical spinal stenosis 04/17/2020   Neck pain, bilateral 11/28/2019   Pain of cervical spine 11/28/2019   Chronic right shoulder pain 09/23/2019   HTN (hypertension) 04/20/2016   Allergic sinusitis 12/09/2013   Skin cancer, basal cell 04/16/2013  TOBACCO USER 02/05/2007   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 FU in 39moff medication.       Cervical spinal stenosis    S/P surgical correction. Still with ongoing numbness/tingling in UEs. Intolerant to Gabapentin d/t GI s/e. Will try Cymbalta once daily. Start 30 mg QD x 14 days then increase to 60 mg QD. Requested 335mollow up due to work - he has refills to continue the '60mg'$  daily dose until late October at his return. I asked him to MyChart me with any updates in between.       Anal condyloma - Primary    Refractory to Imiquimod and actually he reports it to be larger over the last 42m65mill place referral to General Surgery for evaluation and consideration of removal.       Relevant Orders   Ambulatory referral to General Surgery   Other Visit Diagnoses     Essential hypertension          SteJanene MadeiraSN, NP-C [facility] InfOrlovistager: [phone]: [phone]/22/22  9:14 AM

## 2021-02-22 NOTE — Assessment & Plan Note (Signed)
Refractory to Imiquimod and actually he reports it to be larger over the last 36m Will place referral to General Surgery for evaluation and consideration of removal.

## 2021-03-09 ENCOUNTER — Telehealth: Payer: Self-pay

## 2021-03-09 ENCOUNTER — Encounter: Payer: Self-pay | Admitting: Infectious Diseases

## 2021-03-09 MED ORDER — BLOOD PRESSURE CUFF MISC: 1.0000 [IU] | Freq: Once | 0 refills | Status: AC

## 2021-03-09 MED ORDER — HYDROCHLOROTHIAZIDE 25 MG PO TABS: 25.0000 mg | ORAL_TABLET | Freq: Every day | ORAL | 2 refills | Status: DC

## 2021-03-09 NOTE — Telephone Encounter (Signed)
Yes he can take a dose of his diovan-hctz combo today.   I think what I want to do for Marvin Macias is to just treat the blood pressure with HCTZ alone and see how his BP does. His blood pressure is always way lower than that when he comes to clinic (like 100/60s) so the previous one I think was too strong for him.   I sent in HCTZ 25 mg tabs - when he picks this up have him STOP the diovan-hctz combo pill and START this one once daily in the morning. If he has dizziness on it cut it in half for a few days then try the whole pill again.  Should help with the swelling in the ankles too.   I also tried to write a rx for a blood pressure cuff for him to pick up. Not sure his insurance will cover it but we can try. Would be a nice tool for him to have at home.   Can you have him come back for a follow up with me or pharmacy team in 4-6 weeks to repeat a BP? Thank you

## 2021-03-09 NOTE — Telephone Encounter (Signed)
Patient called, says he had his blood pressure checked and it was 173/102. He denies headache and vision changes, but reports dizziness. He is wondering if he can take one of his blood pressure pills. Relayed that RN is unable to advise him and recommends he be seen by urgent care. Patient verbalized understanding and has no further questions. Will route to provider.    Beryle Flock, RN

## 2021-03-09 NOTE — Addendum Note (Signed)
Addended by: [place] Callas on: 03/09/2021 02:11 PM   Modules accepted: Orders

## 2021-03-09 NOTE — Telephone Encounter (Signed)
Spoke with patient, he states he is currently in the emergency department with a BP of 174/103. Relayed that Collet[facility] [place] would like him to start a new medication called hydrochlorothiazide tomorrow morning as they will probably treat his BP in the ED. Relayed that Collet[facility] Maryland recommends he cut the pill in half for a few days if he experiences dizziness and then resume full dose.   Emphasized that Colletta Maryland would like him to stop the diovan-HCTZ and start HCTZ tomorrow. RN updated Colletta Maryland that patient is in ED and she agrees that ED will most likely treat his blood pressure while he's there. Notified him that a BP cuff has been prescribed, but that he needs to check and see if insurance will cover it. Patient verbalized understanding and has no further questions.   Patient scheduled for follow up.   Beryle Flock, RN

## 2021-03-10 ENCOUNTER — Ambulatory Visit (INDEPENDENT_AMBULATORY_CARE_PROVIDER_SITE_OTHER): Payer: No Typology Code available for payment source | Admitting: Internal Medicine

## 2021-03-10 ENCOUNTER — Other Ambulatory Visit: Payer: Self-pay

## 2021-03-10 VITALS — BP 148/95 | HR 78 | Temp 98.4°F | Resp 16 | Ht 67.0 in | Wt 141.0 lb

## 2021-03-10 DIAGNOSIS — R197 Diarrhea, unspecified: Secondary | ICD-10-CM

## 2021-03-10 DIAGNOSIS — B20 Human immunodeficiency virus [HIV] disease: Secondary | ICD-10-CM | POA: Diagnosis not present

## 2021-03-10 DIAGNOSIS — J181 Lobar pneumonia, unspecified organism: Secondary | ICD-10-CM | POA: Diagnosis not present

## 2021-03-10 MED ORDER — DOXYCYCLINE HYCLATE 100 MG PO TABS: 100.0000 mg | ORAL_TABLET | Freq: Two times a day (BID) | ORAL | 0 refills | Status: DC

## 2021-03-10 MED ORDER — DOXYCYCLINE HYCLATE 100 MG PO TABS: 100.0000 mg | ORAL_TABLET | Freq: Two times a day (BID) | ORAL | 0 refills | Status: AC

## 2021-03-10 NOTE — Addendum Note (Signed)
Addended by: Loma Mar Callas on: 03/10/2021 09:02 AM   Modules accepted: Orders

## 2021-03-10 NOTE — Patient Instructions (Signed)
Finish 7-10 days doxy   Watch for sign/sx of dehydration, persistent fever, worsening cough, diarrhea beyond the next 3-5 days. If this is the case please give our clinic a call   Otherwise f/u with Colletta Maryland as previously scheduled

## 2021-03-10 NOTE — Progress Notes (Signed)
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   MRN: [id number]  Chief Complaint  Patient presents with   Follow-up    Started Doxy     HPI:  MAICON SALDANHA is a 50 y.o. male well controlled hiv, here for ED f/u on 9/6 for high blood pressure but ultimately also got urine culture/cxr with cxr suggesting mild rll opacity   Patient had communications with [facility] about legs edema and blood pressure meds and bp at home [id number] systolic  Diovan/hctz was changed to hctz  He ultimately went to the ED 9/06 for sbp 170s.   Review of chart also saw cymbalta recently started 2 weeks prior to this visit -- no noticed side effect  His hiv is well controlled on odefsey which he has been on for a long time    Patient is feeling well except the diarrhea Diarrhea started last night -- he had had 8 watery nonbloody diarrhea episodes. He endorses some nausea right before the diarrhea and some gagging.  His appetite is decreased since the diarrhea onset No documented fever, but reports subjective chill. Lives with his pet dog who does go outside Lives in [place] in apartment building. He walks his dogs a lot. No surrounding vegetation much. Didn't notice any tick bite No rash No new myalgia/arthralgia Mild headache New mild cough/dry and chest pain left anterior side  He went  back and said 3-4 days ago didn't feel well Associated fatigue      Lab Results  Component Value Date   HIV1RNAQUANT Not Detected 09/24/2020   Lab Results  Component Value Date   CD4TCELL 40 09/24/2020   CD4TABS 712 09/24/2020      Allergies  Allergen Reactions   Codeine Nausea And Vomiting   Erythromycin Swelling   Hydrocodone-Acetaminophen Nausea And Vomiting   Latex Itching    Burning   Niacin Swelling   Penicillins Hives      Outpatient Medications Prior to Visit  Medication Sig Dispense Refill   Crofelemer (MYTESI) 125 MG TBEC TAKE 1 TABLET BY MOUTH TWICE DAILY AT 10AM AND AT 5PM 60 tablet 3   doxycycline (VIBRA-TABS) 100 MG tablet Take 1 tablet (100 mg total) by mouth 2 (two) times daily. 10 tablet 0   DULoxetine (CYMBALTA) 30 MG capsule Take 1 capsule (30 mg total) by mouth daily for 14 days, THEN 2 capsules (60 mg total) daily for 14 days. 42 capsule 0   [START ON 04/24/2021] DULoxetine (CYMBALTA) 60 MG capsule Take 1 capsule (60 mg total) by mouth daily. 30 capsule 3   emtricitabine-rilpivir-tenofovir AF (ODEFSEY) 200-25-25 MG TABS tablet Take 1 tablet by mouth daily with breakfast. 30 tablet 11   gabapentin (NEURONTIN) 100 MG capsule TAKE 1 CAPSULE(100 MG) BY MOUTH TWICE DAILY (Patient not taking: No sig reported) 60  capsule 0   hydrochlorothiazide (HYDRODIURIL) 25 MG tablet Take 1 tablet (25 mg total) by mouth daily. 30 tablet 2   imiquimod (ALDARA) 5 % cream Apply topically 3 (three) times a week. 12 each 0   nicotine (NICODERM CQ - DOSED IN MG/24 HOURS) 14 mg/24hr patch Place 1 patch (14 mg total) onto the skin daily. 7 patch 0   oxyCODONE-acetaminophen (PERCOCET/ROXICET) 5-325 MG tablet Take 1 tablet by mouth every 4 (four) hours as needed for severe pain. 40 tablet 0   valACYclovir (VALTREX) 1000 MG tablet TAKE 1 TABLET(1000 MG) BY MOUTH TWICE DAILY 60 tablet 3   No facility-administered medications prior to visit.     Social History   Socioeconomic  History   Marital status: Single    Spouse name: Not on file   Number of children: Not on file   Years of education: Not on file   Highest education level: Not on file  Occupational History   Not on file  Tobacco Use   Smoking status: Every Day    Packs/day: 1.00    Years: 25.00    Pack years: 25.00    Types: Cigarettes   Smokeless tobacco: Never   Tobacco comments:    cutting back  Vaping Use   Vaping Use: Never used  Substance and Sexual Activity   Alcohol use: Yes    Alcohol/week: 5.0 standard drinks    Types: 5 Shots of liquor per week    Comment: vodka   Drug use: Yes    Frequency: 4.0 times per week    Types: Marijuana   Sexual activity: Yes    Partners: Male    Comment: declined condoms   Other Topics Concern   Not on file  Social History Narrative   Not on file   Social Determinants of Health   Financial Resource Strain: Not on file  Food Insecurity: Not on file  Transportation Needs: Not on file  Physical Activity: Not on file  Stress: Not on file  Social Connections: Not on file  Intimate Partner Violence: Not on file      Review of Systems    All other ros negative  Objective:    BP (!) 148/95   Pulse 78   Temp 98.4 F (36.9 C) (Oral)   Resp 16   Ht '5\' 7"'$  (1.702 m)   Wt 141 lb (64 kg)   SpO2 96%   BMI 22.08 kg/m  Nursing note and vital signs reviewed.  Physical Exam     General/constitutional: no distress, pleasant Patient is here with his pet dog HEENT: Normocephalic, PER, Conj Clear, EOMI, Oropharynx clear Neck supple CV: rrr no mrg Lungs: clear to auscultation, normal respiratory effort Abd: Soft, Nontender Ext: no edema Skin: No Rash Neuro: nonfocal MSK: no peripheral joint swelling/tenderness/warmth; back spines nontender    Labs: Lab Results  Component Value Date   WBC 9.5 09/24/2020   HGB 15.9 09/24/2020   HCT 45.4 09/24/2020   MCV 106.3 (H) 09/24/2020   PLT 240 [id number]   Last metabolic panel Lab Results   Component Value Date   GLUCOSE 82 09/24/2020   NA 138 09/24/2020   K 3.9 09/24/2020   CL 103 09/24/2020   CO2 24 09/24/2020   BUN 10 09/24/2020   CREATININE 0.81 09/24/2020   GFRNONAA 102 09/24/2020   GFRAA 118 09/24/2020   CALCIUM 9.4 09/24/2020   PHOS 3.3 03/08/2011   PROT 6.4 09/24/2020   ALBUMIN 4.2 04/08/2020  BILITOT 0.6 09/24/2020   ALKPHOS 43 04/08/2020   AST 17 09/24/2020   ALT 13 09/24/2020   ANIONGAP 11 04/08/2020   Covid pcr/flu negative 9/6 Cbc/cmp essentially normal from [facility] 9/6; trop negative  Micro:  Serology:  Imaging: 9/6y cxr  Assessment & Plan:   Problem List Items Addressed This Visit       Other   DIARRHEA - Primary   Other Visit Diagnoses     Pneumonia due to Streptococcus pneumoniae, unspecified laterality, unspecified part of lung ([place])           Well controlled hiv Cymbalta started 2 weeks ago without any acute sx, unlikely related to current symptomatology  Acute presentation cough/malaisea/n/diarrhea suggest drug related or acute infection. In his case an acute infection is likely. Cxr right basilar opacities as well support this along with cough  Most likely viral vs tick. Doxy 7-10 day is a benign course for empiric tx. Labs tests in terms of tick serology not particularly helpful in management at this time   -finish 7-10 days doxy -call id clinic if s/s dehydration or worsening disease persistent fever, worsening cough, severe rash, or ongoing severe (>6/day) diarrhea in the next 5 days or more.  -f/u Stephanie dixon end of this month otherwise if sx improves  I have spent a total of 20 minutes of face-to-face and non-face-to-face time, excluding clinical staff time, preparing to see patient, ordering tests and/or medications, and provide counseling the patient    Follow-up: No follow-ups on file.      Jabier Mutton, [place] [facility] [place] [phone]  pager    [phone] cell 03/10/2021, 10:52 AM

## 2021-03-19 ENCOUNTER — Telehealth: Payer: Self-pay | Admitting: Infectious Diseases

## 2021-03-19 MED ORDER — VALSARTAN-HYDROCHLOROTHIAZIDE 80-12.5 MG PO TABS: 1.0000 | ORAL_TABLET | Freq: Every day | ORAL | 2 refills | Status: DC

## 2021-03-19 NOTE — Telephone Encounter (Signed)
Multiple mycharts received from [facility] re: BPs that are still SBP > 150-170.   Will STOP HCTZ and resume Diovan-HCT at previous dose.   I also gave him the number to for a few primary care / internal medicine clinics that would be a better resource for his chronic care management of other conditions.    Janene Madeira, MSN, NP-C [facility] [email or web address] Pager: [phone] Office: [phone] [place]: [phone]

## 2021-04-16 ENCOUNTER — Other Ambulatory Visit: Payer: Self-pay

## 2021-04-20 ENCOUNTER — Other Ambulatory Visit: Payer: Self-pay

## 2021-04-20 ENCOUNTER — Ambulatory Visit: Payer: No Typology Code available for payment source | Admitting: Infectious Diseases

## 2021-04-20 VITALS — BP 131/90 | HR 101 | Temp 98.2°F | Resp 16 | Ht 67.0 in | Wt 144.8 lb

## 2021-04-20 DIAGNOSIS — I158 Other secondary hypertension: Secondary | ICD-10-CM

## 2021-04-20 DIAGNOSIS — A630 Anogenital (venereal) warts: Secondary | ICD-10-CM | POA: Diagnosis not present

## 2021-04-20 DIAGNOSIS — M4802 Spinal stenosis, cervical region: Secondary | ICD-10-CM

## 2021-04-20 DIAGNOSIS — Z23 Encounter for immunization: Secondary | ICD-10-CM | POA: Diagnosis not present

## 2021-04-20 DIAGNOSIS — Z0000 Encounter for general adult medical examination without abnormal findings: Secondary | ICD-10-CM

## 2021-04-20 MED ORDER — DULOXETINE HCL 60 MG PO CPEP: 60.0000 mg | ORAL_CAPSULE | Freq: Every day | ORAL | 5 refills | Status: DC

## 2021-04-20 NOTE — Assessment & Plan Note (Signed)
He had some better success with a previous tincture/topical Dr. Johnnye Sima prescribed him in the past. I asked him to send a picture via mychart so I can see if I can re-order this for him.

## 2021-04-20 NOTE — Progress Notes (Signed)
Name: Marvin Macias  DOB: January 03, 1971 MRN: [id number] PCP: Pcp, No     Brief Narrative:  Marvin Macias is a 50 y.o. male with HIV disease, Dx 2008. History of OIs: none known. HIV RIsk: MSM   Previous Regimens: Atripla  Stribild  Odefsey >> suppressed   Genotypes: Sensitive   Subjective:  CC:  Blood pressure follow up.    HPI: Did not do well stopping diovan-hctz combo with rebound BPs > 150s with dizziness. He is back on this routinely now.  Still with increased stress at work.   Would like to increase cymbalta for the nerve pain in shoulder/arm.   ROS   Past Medical History:  Diagnosis Date   Anxiety    Depression    Family history of adverse reaction to anesthesia    Mother had vomiting   HIV infection ([place])    Hypertension     Outpatient Medications Prior to Visit  Medication Sig Dispense Refill   Crofelemer (MYTESI) 125 MG TBEC TAKE 1 TABLET BY MOUTH TWICE DAILY AT 10AM AND AT 5PM 60 tablet 3   doxycycline (VIBRA-TABS) 100 MG tablet Take 1 tablet (100 mg total) by mouth 2 (two) times daily. 10 tablet 0   emtricitabine-rilpivir-tenofovir AF (ODEFSEY) 200-25-25 MG TABS tablet Take 1 tablet by mouth daily with breakfast. 30 tablet 11   hydrochlorothiazide (HYDRODIURIL) 25 MG tablet Take by mouth.     valsartan-hydrochlorothiazide (DIOVAN HCT) 80-12.5 MG tablet Take 1 tablet by mouth daily. 30 tablet 2   DULoxetine (CYMBALTA) 30 MG capsule Take 1 capsule (30 mg total) by mouth daily for 14 days, THEN 2 capsules (60 mg total) daily for 14 days. 42 capsule 0   DULoxetine (CYMBALTA) 60 MG capsule Take 1 capsule (60 mg total) by mouth daily. 30 capsule 3   imiquimod (ALDARA) 5 % cream Apply topically 3 (three) times a week. (Patient not taking: Reported on 04/20/2021) 12 each 0   nicotine (NICODERM CQ - DOSED IN MG/24 HOURS) 14 mg/24hr patch Place 1 patch (14 mg total) onto the skin daily. (Patient not taking: Reported on 04/20/2021) 7 patch 0    oxyCODONE-acetaminophen (PERCOCET/ROXICET) 5-325 MG tablet Take 1 tablet by mouth every 4 (four) hours as needed for severe pain. (Patient not taking: Reported on 04/20/2021) 40 tablet 0   valACYclovir (VALTREX) 1000 MG tablet TAKE 1 TABLET(1000 MG) BY MOUTH TWICE DAILY (Patient not taking: Reported on 04/20/2021) 60 tablet 3   gabapentin (NEURONTIN) 100 MG capsule TAKE 1 CAPSULE(100 MG) BY MOUTH TWICE DAILY (Patient not taking: Reported on 04/20/2021) 60 capsule 0   No facility-administered medications prior to visit.     Allergies  Allergen Reactions   Codeine Nausea And Vomiting   Erythromycin Swelling   Hydrocodone-Acetaminophen Nausea And Vomiting   Latex Itching    Burning   Niacin Swelling   Penicillins Hives    Social History   Tobacco Use   Smoking status: Every Day    Packs/day: 1.00    Years: 25.00    Pack years: 25.00    Types: Cigarettes   Smokeless tobacco: Never   Tobacco comments:    cutting back  Vaping Use   Vaping Use: Never used  Substance Use Topics   Alcohol use: Yes    Alcohol/week: 5.0 standard drinks    Types: 5 Shots of liquor per week    Comment: vodka   Drug use: Yes    Frequency: 4.0 times per week  Types: Marijuana    Social History   Substance and Sexual Activity  Sexual Activity Yes   Partners: Male   Comment: declined condoms      Objective:   Vitals:   04/20/21 1615  BP: 131/90  Pulse: (!) 101  Resp: 16  Temp: 98.2 F (36.8 C)  TempSrc: Temporal  SpO2: 98%  Weight: 144 lb 12.8 oz (65.7 kg)  Height: 5\' 7"  (1.702 m)    Body mass index is 22.68 kg/m.  Physical Exam  Lab Results Lab Results  Component Value Date   WBC 9.5 09/24/2020   HGB 15.9 09/24/2020   HCT 45.4 09/24/2020   MCV 106.3 (H) 09/24/2020   PLT 240 09/24/2020    Lab Results  Component Value Date   CREATININE 0.81 09/24/2020   BUN 10 09/24/2020   NA 138 09/24/2020   K 3.9 09/24/2020   CL 103 09/24/2020   CO2 24 09/24/2020    Lab  Results  Component Value Date   ALT 13 09/24/2020   AST 17 09/24/2020   ALKPHOS 43 04/08/2020   BILITOT 0.6 09/24/2020    Lab Results  Component Value Date   CHOL 187 09/24/2020   HDL 54 09/24/2020   LDLCALC 107 (H) 09/24/2020   TRIG 146 09/24/2020   CHOLHDL 3.5 09/24/2020   HIV 1 RNA Quant  Date Value  09/24/2020 Not Detected Copies/mL  08/13/2019 26 copies/mL (H)  08/06/2018 69 copies/mL (H)   CD4 T Cell Abs (/uL)  Date Value  09/24/2020 712  08/13/2019 998  08/06/2018 1,290     Assessment & Plan:   Patient Active Problem List   Diagnosis Date Noted   Healthcare maintenance 04/26/2021   S/P cervical spinal fusion 04/24/2020   Cervical spinal stenosis 04/17/2020   Neck pain, bilateral 11/28/2019   Pain of cervical spine 11/28/2019   Chronic right shoulder pain 09/23/2019   HTN (hypertension) 04/20/2016   Allergic sinusitis 12/09/2013   Skin cancer, basal cell 04/16/2013   TOBACCO USER 02/05/2007   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 Will continue.       Healthcare maintenance    Flu shot today      Cervical spinal stenosis    Increase cymbalta to 60 mg daily - new rx has been sent. If this does not work I recommend he reach out to his surgeon for alternative recommendation given he was intolerant of gabapentin.       Anal condyloma    He had some better success with a previous tincture/topical Dr. Johnnye Sima prescribed him in the past. I asked him to send a picture via mychart so I can see if I can re-order this for him.       Other Visit Diagnoses     Need for immunization  against influenza    -  Primary   Relevant Orders   Flu Vaccine QUAD 75mo+IM (Fluarix, Fluzone & Alfiuria Quad PF) (Completed)       Janene Madeira, MSN, NP-C [facility] [place] Pager: [phone] Office: [phone]  04/26/21  8:28  AM

## 2021-04-20 NOTE — Patient Instructions (Addendum)
Will continue your blood pressure medication once a day - it looks better back on it.   Lets plan to see you back in 4-6 months - we can repeat your blood work at that time and recheck your blood pressure. We can do lab work the same day for your convenience.   Flu shot today.  I am sorry we were out of the COVID booster - if you get one from a local pharmacy would recommend the BIVALENT one.   Send me a picture of the topical treatment you were talking with me about today.

## 2021-04-26 ENCOUNTER — Encounter: Payer: Self-pay | Admitting: Infectious Diseases

## 2021-04-26 DIAGNOSIS — Z0000 Encounter for general adult medical examination without abnormal findings: Secondary | ICD-10-CM | POA: Insufficient documentation

## 2021-04-26 NOTE — Assessment & Plan Note (Signed)
BP Readings from Last 3 Encounters:  04/20/21 131/90  03/10/21 (!) 148/95  02/22/21 103/72   Better controlled now back on previous combination medication. Will continue.

## 2021-04-26 NOTE — Assessment & Plan Note (Signed)
Increase cymbalta to 60 mg daily - new rx has been sent. If this does not work I recommend he reach out to his surgeon for alternative recommendation given he was intolerant of gabapentin.

## 2021-04-26 NOTE — Assessment & Plan Note (Signed)
Flu shot today 

## 2021-05-03 MED ORDER — DULOXETINE HCL 60 MG PO CPEP: 120.0000 mg | ORAL_CAPSULE | Freq: Every day | ORAL | 2 refills | Status: DC

## 2021-05-17 ENCOUNTER — Other Ambulatory Visit: Payer: Self-pay | Admitting: Infectious Diseases

## 2021-05-17 DIAGNOSIS — R197 Diarrhea, unspecified: Secondary | ICD-10-CM

## 2021-05-17 DIAGNOSIS — B029 Zoster without complications: Secondary | ICD-10-CM

## 2021-05-17 NOTE — Telephone Encounter (Signed)
Please advise on Mytesi DR 125 mg tab refills.

## 2021-06-01 ENCOUNTER — Other Ambulatory Visit: Payer: Self-pay | Admitting: Family

## 2021-06-01 MED ORDER — DULOXETINE HCL 60 MG PO CPEP: 120.0000 mg | ORAL_CAPSULE | Freq: Every day | ORAL | 1 refills | Status: DC

## 2021-06-08 ENCOUNTER — Other Ambulatory Visit: Payer: Self-pay | Admitting: General Surgery

## 2021-06-14 ENCOUNTER — Other Ambulatory Visit: Payer: Self-pay | Admitting: Infectious Diseases

## 2021-06-14 NOTE — Telephone Encounter (Signed)
Okay to refill? No PCP listed, thanks!

## 2021-07-27 ENCOUNTER — Encounter: Payer: Self-pay | Admitting: Family

## 2021-07-27 ENCOUNTER — Other Ambulatory Visit (HOSPITAL_COMMUNITY)
Admission: RE | Admit: 2021-07-27 | Discharge: 2021-07-27 | Disposition: A | Discharge status: Home / Self Care | Payer: 59 | Source: Ambulatory Visit | Attending: Family | Admitting: Family

## 2021-07-27 ENCOUNTER — Ambulatory Visit (INDEPENDENT_AMBULATORY_CARE_PROVIDER_SITE_OTHER): Payer: 59 | Admitting: Family

## 2021-07-27 ENCOUNTER — Other Ambulatory Visit: Payer: Self-pay

## 2021-07-27 VITALS — BP 116/79 | HR 123 | Temp 98.2°F | Ht 67.0 in | Wt 138.0 lb

## 2021-07-27 DIAGNOSIS — Z113 Encounter for screening for infections with a predominantly sexual mode of transmission: Secondary | ICD-10-CM | POA: Diagnosis present

## 2021-07-27 DIAGNOSIS — N522 Drug-induced erectile dysfunction: Secondary | ICD-10-CM | POA: Insufficient documentation

## 2021-07-27 DIAGNOSIS — A549 Gonococcal infection, unspecified: Secondary | ICD-10-CM | POA: Diagnosis not present

## 2021-07-27 MED ORDER — TADALAFIL 10 MG PO TABS: 10.0000 mg | ORAL_TABLET | Freq: Every day | ORAL | 0 refills | Status: DC | PRN

## 2021-07-27 MED ORDER — CEFTRIAXONE SODIUM 500 MG IJ SOLR
500.0000 mg | Freq: Once | INTRAMUSCULAR | Status: AC
  Administered 2021-07-27: 15:00:00 500 mg via INTRAMUSCULAR

## 2021-07-27 MED ORDER — DOXYCYCLINE HYCLATE 100 MG PO TABS: 100.0000 mg | ORAL_TABLET | Freq: Two times a day (BID) | ORAL | 0 refills | Status: DC

## 2021-07-27 NOTE — Patient Instructions (Signed)
Nice to see you.  We will check your lab work today.  Continue to take your medication daily as prescribed.  Additional treatment pending lab work as needed.   Have a great day and stay safe!

## 2021-07-27 NOTE — Assessment & Plan Note (Signed)
Marvin Macias had recent partner testing positive for gonorrhea and is currently without symptoms. Check RPR and STDs.     Will preemptively treat for gonorrhea with 500 mg ceftriaxone and for chlamydia with Doxycycline 100 mg bid PO for 7 days. Discussed importance of safe sexual practice and condom usage with condoms offered. Additional treatment pending lab work as needed.

## 2021-07-27 NOTE — Assessment & Plan Note (Signed)
Mr. Gladson has symptoms consistent with erectile dysfunction which appear to be multifactorial and exacerbated by multiple co-morbid conditions and medications. Discussed risks and benefits of medications and will start Cialis. Advised to seek emergency care for erection lasting longer than 4 hours and to avoid taking with nitrites for chest pain.

## 2021-07-27 NOTE — Progress Notes (Signed)
Subjective:    Patient ID: Marvin Macias, male    DOB: 11-11-1970, 51 y.o.   MRN: [id number]  Chief Complaint  Patient presents with   SEXUALLY TRANSMITTED DISEASE    HPI:  Marvin Macias is a 51 y.o. male with HIV disease last seen on 02/22/21 with well controlled virus and good adherence and tolerance to his ART regimen of Odefsey presenting today for an acute office visit.   Marvin Macias was informed by a recent partner that he had tested positive for gonorrhea. Currently without symptoms and denies fevers, chills, or arthralgias.   Allergies  Allergen Reactions   Codeine Nausea And Vomiting   Erythromycin Swelling   Hydrocodone-Acetaminophen Nausea And Vomiting   Latex Itching    Burning   Niacin Swelling   Penicillins Hives      Outpatient Medications Prior to Visit  Medication Sig Dispense Refill   Crofelemer (MYTESI) 125 MG TBEC TAKE 1 TABLET BY MOUTH TWICE DAILY AT 10:00AM AND 5:00PM 60 tablet 11   DULoxetine (CYMBALTA) 60 MG capsule Take 2 capsules (120 mg total) by mouth daily. 60 capsule 1   emtricitabine-rilpivir-tenofovir AF (ODEFSEY) 200-25-25 MG TABS tablet Take 1 tablet by mouth daily with breakfast. 30 tablet 11   valACYclovir (VALTREX) 1000 MG tablet TAKE 1 TABLET(1000 MG) BY MOUTH TWICE DAILY 60 tablet 11   valsartan-hydrochlorothiazide (DIOVAN-HCT) 80-12.5 MG tablet TAKE 1 TABLET BY MOUTH DAILY 30 tablet 11   imiquimod (ALDARA) 5 % cream Apply topically 3 (three) times a week. (Patient not taking: Reported on 04/20/2021) 12 each 0   nicotine (NICODERM CQ - DOSED IN MG/24 HOURS) 14 mg/24hr patch Place 1 patch (14 mg total) onto the skin daily. (Patient not taking: Reported on 04/20/2021) 7 patch 0   oxyCODONE-acetaminophen (PERCOCET/ROXICET) 5-325 MG tablet Take 1 tablet by mouth every 4 (four) hours as needed for severe pain. (Patient not taking: Reported on 04/20/2021) 40 tablet 0   doxycycline (VIBRA-TABS) 100 MG tablet Take 1 tablet (100 mg total) by mouth  2 (two) times daily. (Patient not taking: Reported on 07/27/2021) 10 tablet 0   No facility-administered medications prior to visit.     Past Medical History:  Diagnosis Date   Anxiety    Depression    Family history of adverse reaction to anesthesia    Mother had vomiting   HIV infection ([place])    Hypertension      Past Surgical History:  Procedure Laterality Date   ANTERIOR CERVICAL DECOMP/DISCECTOMY FUSION N/A 04/17/2020   Procedure: CERVICAL SIX- CERVICAL SEVEN  ANTERIOR CERVICAL DECOMPRESSION/DISCECTOMY FUSION, ALLOGRAFT, PLATE;  Surgeon: Marybelle Killings, MD;  Location: [facility];  Service: Orthopedics;  Laterality: N/A;   APPENDECTOMY  1998   HAND SURGERY Right 2005       Review of Systems  Constitutional:  Negative for appetite change, chills, fatigue, fever and unexpected weight change.  Eyes:  Negative for visual disturbance.  Respiratory:  Negative for cough, chest tightness, shortness of breath and wheezing.   Cardiovascular:  Negative for chest pain and leg swelling.  Gastrointestinal:  Negative for abdominal pain, constipation, diarrhea, nausea and vomiting.  Genitourinary:  Negative for dysuria, flank pain, frequency, genital sores, hematuria and urgency.  Skin:  Negative for rash.  Allergic/Immunologic: Negative for immunocompromised state.  Neurological:  Negative for dizziness and headaches.     Objective:    BP 116/79    Pulse (!) 123    Temp 98.2 F (36.8 C) (Oral)  Ht 5\' 7"  (1.702 m)    Wt 138 lb (62.6 kg)    SpO2 98%    BMI 21.61 kg/m  Nursing note and vital signs reviewed.  Physical Exam Constitutional:      General: He is not in acute distress.    Appearance: He is well-developed.  Eyes:     Conjunctiva/sclera: Conjunctivae normal.  Cardiovascular:     Rate and Rhythm: Normal rate and regular rhythm.     Heart sounds: Normal heart sounds. No murmur heard.   No friction rub. No gallop.  Pulmonary:     Effort: Pulmonary effort is normal. No  respiratory distress.     Breath sounds: Normal breath sounds. No wheezing or rales.  Chest:     Chest wall: No tenderness.  Abdominal:     General: Bowel sounds are normal.     Palpations: Abdomen is soft.     Tenderness: There is no abdominal tenderness.  Musculoskeletal:     Cervical back: Neck supple.  Lymphadenopathy:     Cervical: No cervical adenopathy.  Skin:    General: Skin is warm and dry.     Findings: No rash.  Neurological:     Mental Status: He is alert and oriented to person, place, and time.  Psychiatric:        Behavior: Behavior normal.        Thought Content: Thought content normal.        Judgment: Judgment normal.     Depression screen [facility] 2/9 07/27/2021 04/20/2021 02/22/2021 10/08/2020 02/12/2019  Decreased Interest 0 0 0 0 0  Down, Depressed, Hopeless 1 0 0 0 0  PHQ - 2 Score 1 0 0 0 0  Altered sleeping - - - - -  Tired, decreased energy - - - - -  Change in appetite - - - - -  Feeling bad or failure about yourself  - - - - -  Trouble concentrating - - - - -  Moving slowly or fidgety/restless - - - - -  Suicidal thoughts - - - - -  PHQ-9 Score - - - - -  Some recent data might be hidden       Assessment & Plan:    Patient Active Problem List   Diagnosis Date Noted   Gonorrhea 07/27/2021   Drug-induced erectile dysfunction 07/27/2021   Healthcare maintenance 04/26/2021   S/P cervical spinal fusion 04/24/2020   Cervical spinal stenosis 04/17/2020   Neck pain, bilateral 11/28/2019   Pain of cervical spine 11/28/2019   Chronic right shoulder pain 09/23/2019   HTN (hypertension) 04/20/2016   Allergic sinusitis 12/09/2013   Skin cancer, basal cell 04/16/2013   TOBACCO USER 02/05/2007   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 Check RPR and STDs.     Will preemptively treat for gonorrhea with 500 mg ceftriaxone and for chlamydia with Doxycycline 100 mg bid PO for 7 days. Discussed importance of safe sexual practice and condom usage with condoms offered. Additional treatment pending lab work as needed.       Drug-induced erectile dysfunction    Mr. Bogosian has symptoms consistent with erectile dysfunction which appear to  be multifactorial and exacerbated by multiple co-morbid conditions and medications. Discussed risks and benefits of medications and will start Cialis. Advised to seek emergency care for erection lasting longer than 4 hours and to avoid taking with nitrites for chest pain.       Other Visit Diagnoses     Screening for STDs (sexually transmitted diseases)    -  Primary   Relevant Orders   Cytology (oral, anal, urethral) ancillary only   Cytology (oral, anal, urethral) ancillary only   RPR   Urine cytology ancillary only([place])        I am having Olanda L. Luchsinger start on tadalafil. I am also having him maintain his oxyCODONE-acetaminophen, nicotine, Odefsey, imiquimod, Mytesi, valACYclovir, DULoxetine, valsartan-hydrochlorothiazide, and doxycycline. We administered cefTRIAXone.   Meds ordered this encounter  Medications   doxycycline (VIBRA-TABS) 100 MG tablet    Sig: Take 1 tablet (100 mg total) by mouth 2 (two) times daily.    Dispense:  14 tablet    Refill:  0    Order Specific Question:   Supervising Provider    Answer:   Baxter Flattery, CYNTHIA [[id number]]   tadalafil (CIALIS) 10 MG tablet    Sig: Take 1 tablet (10 mg total) by mouth daily as needed for erectile dysfunction.    Dispense:  30 tablet    Refill:  0    Has good Rx coupon    Order Specific Question:   Supervising Provider    Answer:   Baxter Flattery, CYNTHIA [[id number]]   cefTRIAXone (ROCEPHIN) injection 500 mg     Follow-up: Return if symptoms worsen or fail to  improve.   Terri Piedra, MSN, FNP-C Nurse Practitioner [facility] [place] number: [phone]

## 2021-07-28 LAB — CYTOLOGY, (ORAL, ANAL, URETHRAL) ANCILLARY ONLY
Chlamydia: NEGATIVE
Chlamydia: NEGATIVE
Comment: NEGATIVE
Comment: NEGATIVE
Comment: NORMAL
Comment: NORMAL
Neisseria Gonorrhea: NEGATIVE
Neisseria Gonorrhea: POSITIVE — AB

## 2021-07-28 LAB — URINE CYTOLOGY ANCILLARY ONLY
Chlamydia: NEGATIVE
Comment: NEGATIVE
Comment: NORMAL
Neisseria Gonorrhea: NEGATIVE

## 2021-07-28 LAB — RPR: RPR Ser Ql: NONREACTIVE

## 2021-08-24 ENCOUNTER — Telehealth: Payer: Self-pay
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
## 2021-09-03 MED ORDER — TADALAFIL 10 MG PO TABS: 10.0000 mg | ORAL_TABLET | Freq: Every day | ORAL | 1 refills | Status: DC | PRN

## 2021-09-24 ENCOUNTER — Other Ambulatory Visit: Payer: Self-pay

## 2021-09-27 ENCOUNTER — Other Ambulatory Visit: Payer: Self-pay | Admitting: Family

## 2021-09-27 NOTE — Telephone Encounter (Signed)
Please advise on rx.

## 2021-09-27 NOTE — Telephone Encounter (Signed)
Refiiled ?

## 2021-09-28 ENCOUNTER — Other Ambulatory Visit: Payer: Self-pay | Admitting: Infectious Diseases

## 2021-09-28 DIAGNOSIS — B20 Human immunodeficiency virus [HIV] disease: Secondary | ICD-10-CM

## 2021-10-12 ENCOUNTER — Ambulatory Visit: Payer: No Typology Code available for payment source | Admitting: Infectious Diseases

## 2021-10-13 IMAGING — RF DG CERVICAL SPINE 2 OR 3 VIEWS
1 series · 2 of 2 positions shown · non-contrast
Comparison: MRI cervical spine 01/15/2020

CLINICAL DATA: C6-7 discectomy infusion.

EXAM:
DG C-ARM 1-60 MIN; CERVICAL SPINE - 2-3 VIEW

[Series 1: run · 2 of 2 slices shown]
[im 1/2]
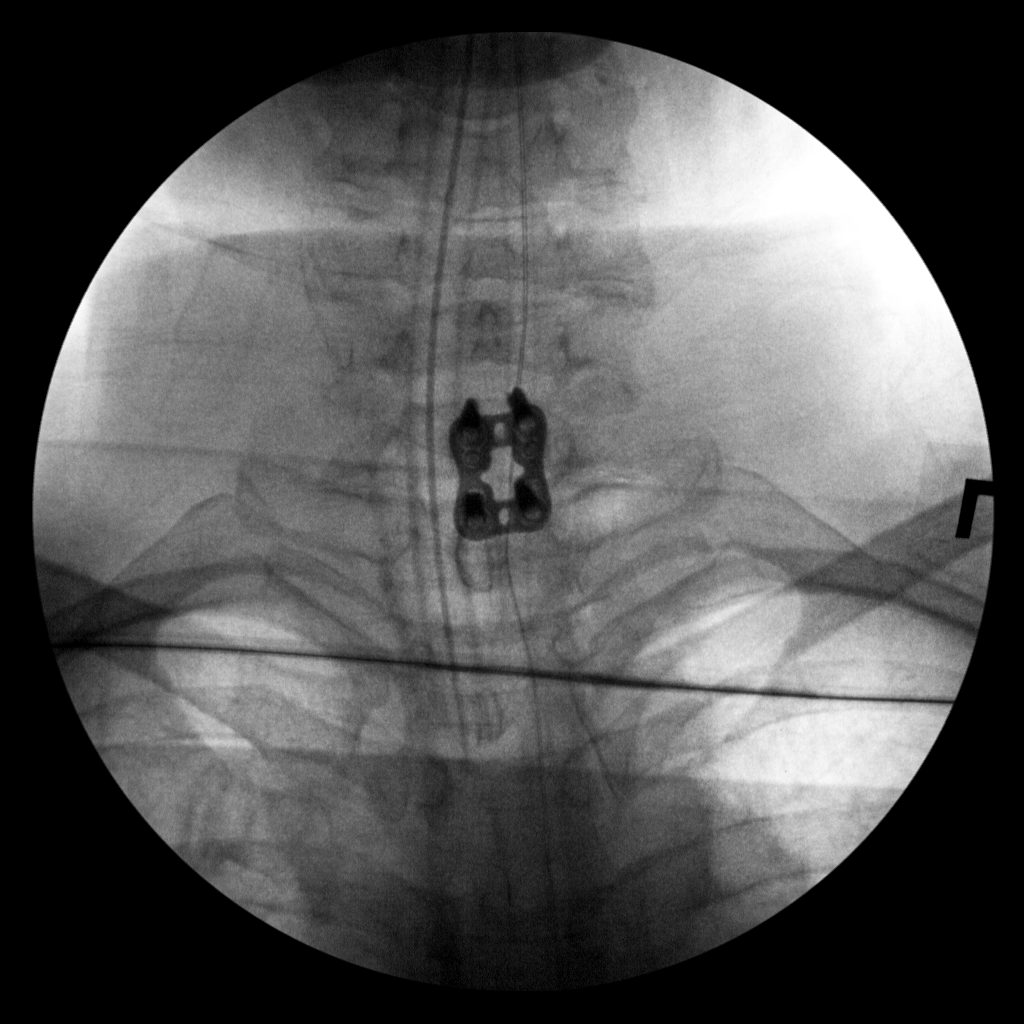
[im 2/2]
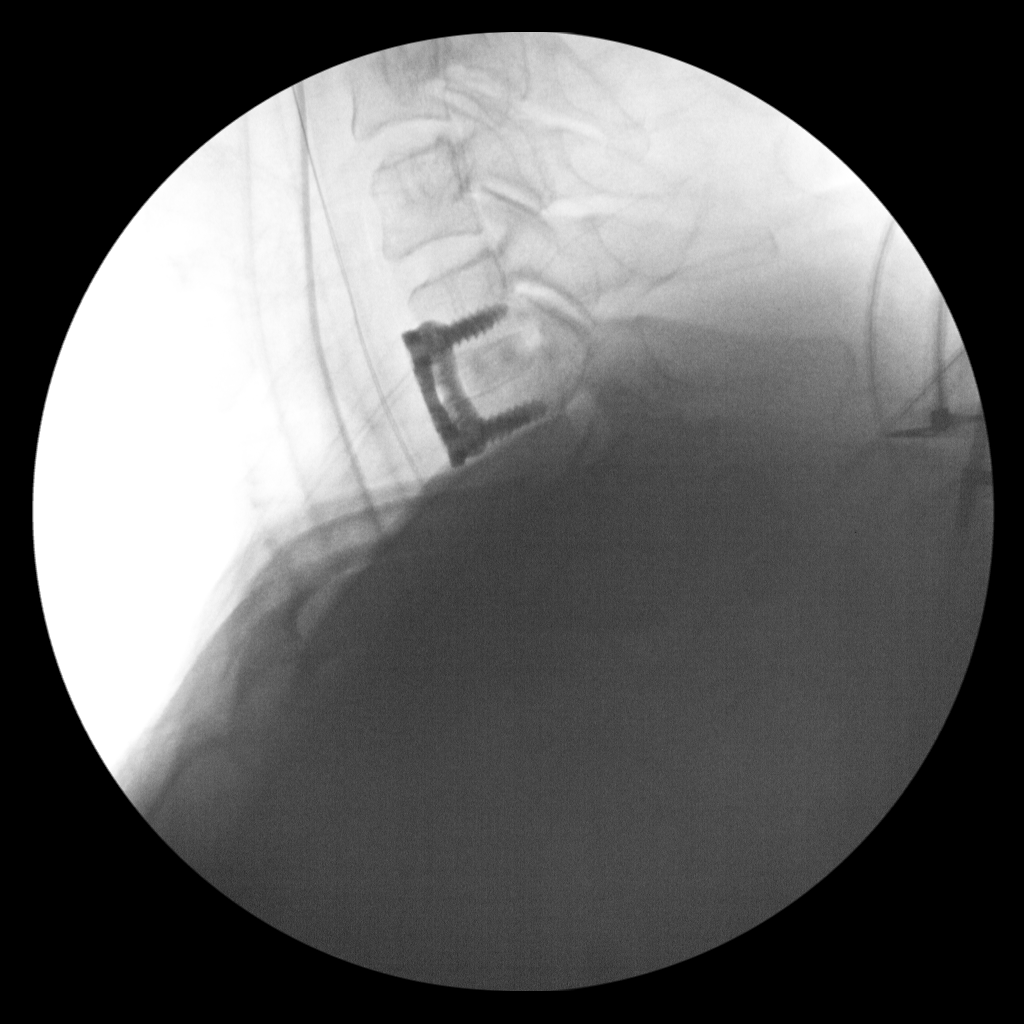

[2 of 2 positions shown; findings below may reference images not displayed]

FINDINGS: AP and lateral C-arm images were obtained following cervical spine
ACDF. The C2 vertebral body is not included on the lateral view
making accurate level assessment not possible. Probable C6-7 level
based on the AP view.
IMPRESSION: ACDF lower cervical spine. Recommend cervical spine radiographs to
confirm level.

## 2021-10-13 IMAGING — RF DG C-ARM 1-60 MIN
1 series · 2 of 2 positions shown · non-contrast
Comparison: MRI cervical spine 01/15/2020

CLINICAL DATA: C6-7 discectomy infusion.

EXAM:
DG C-ARM 1-60 MIN; CERVICAL SPINE - 2-3 VIEW

[Series 1: run · 2 of 2 slices shown]
[im 1/2]
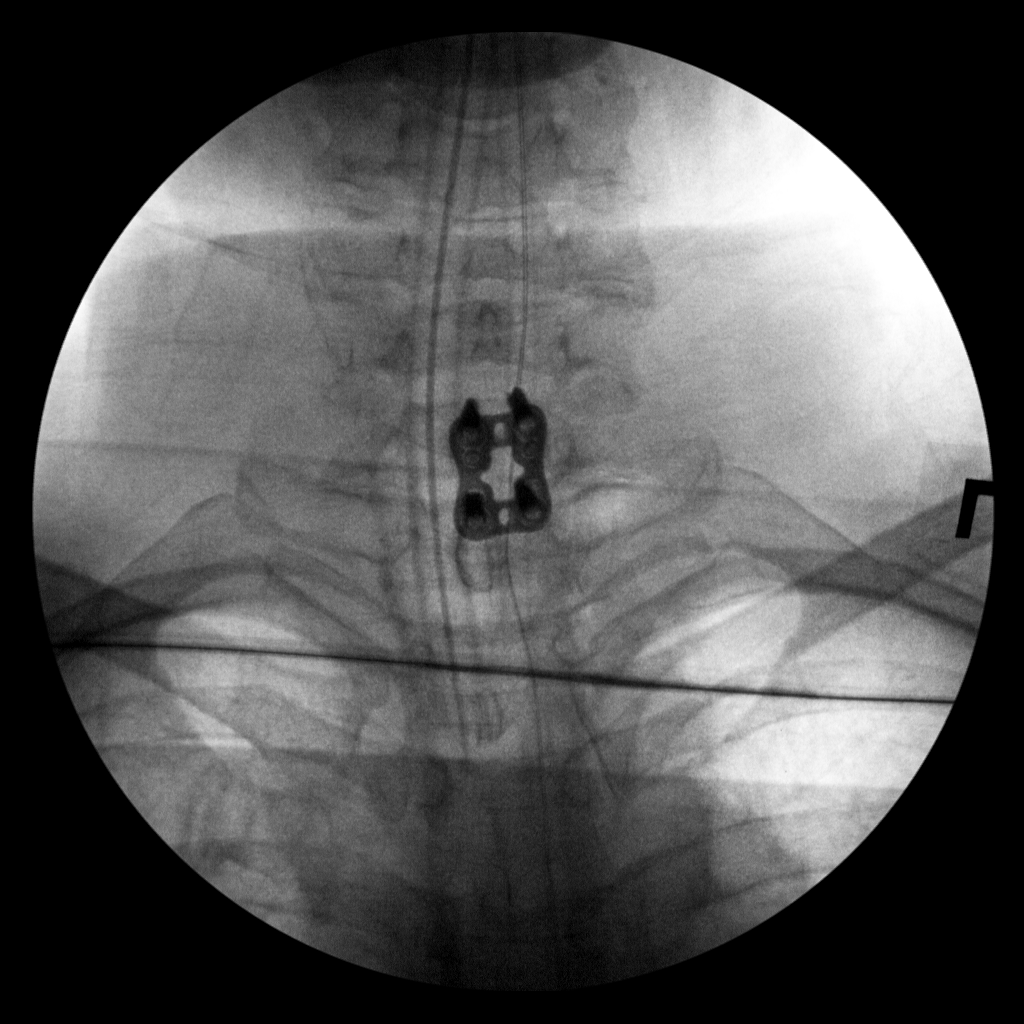
[im 2/2]
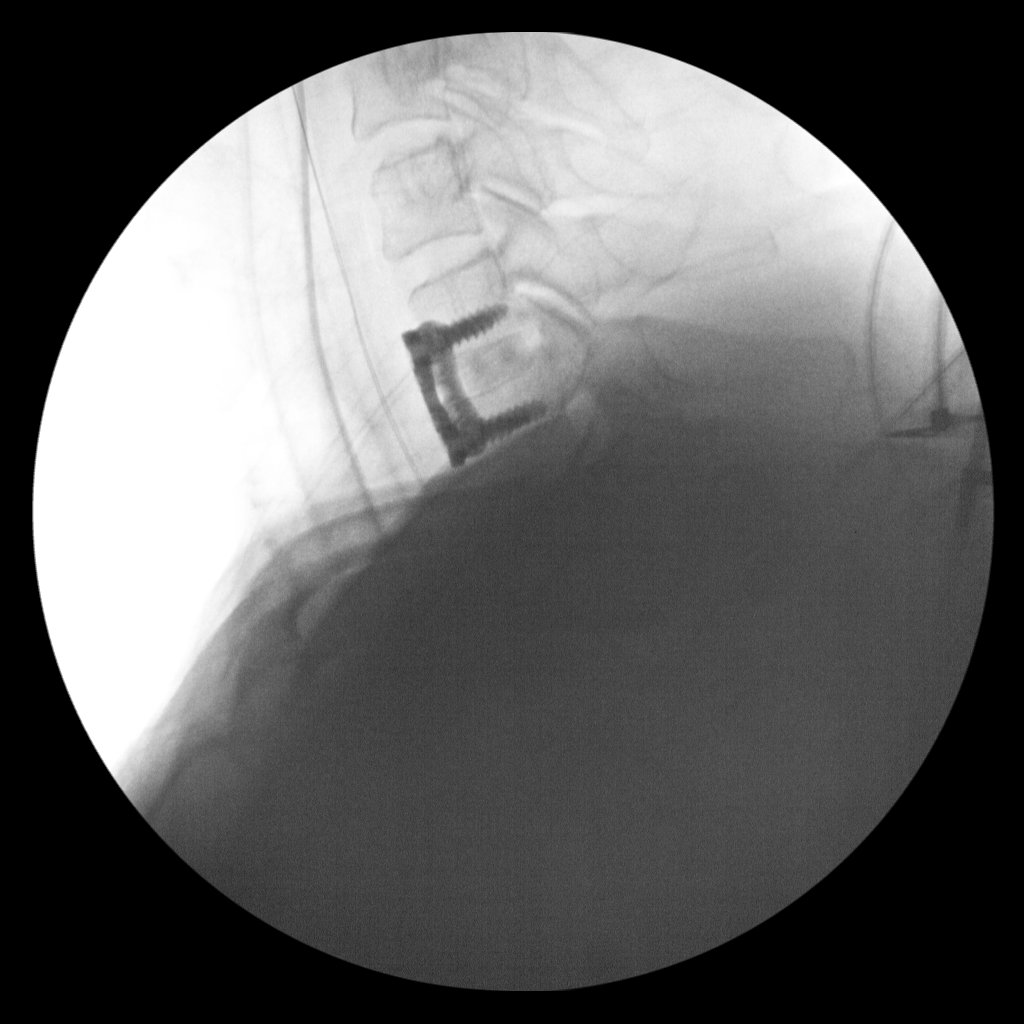

[2 of 2 positions shown; findings below may reference images not displayed]

FINDINGS: AP and lateral C-arm images were obtained following cervical spine
ACDF. The C2 vertebral body is not included on the lateral view
making accurate level assessment not possible. Probable C6-7 level
based on the AP view.
IMPRESSION: ACDF lower cervical spine. Recommend cervical spine radiographs to
confirm level.

## 2021-11-22 ENCOUNTER — Other Ambulatory Visit: Payer: Self-pay | Admitting: Family

## 2021-11-22 NOTE — Telephone Encounter (Signed)
Okay to refill.  Thanks.

## 2021-12-05 ENCOUNTER — Encounter: Payer: Self-pay | Admitting: Infectious Diseases

## 2022-01-11 ENCOUNTER — Other Ambulatory Visit: Payer: Self-pay | Admitting: Family

## 2022-01-12 ENCOUNTER — Ambulatory Visit (INDEPENDENT_AMBULATORY_CARE_PROVIDER_SITE_OTHER): Payer: 59 | Admitting: Orthopaedic Surgery

## 2022-01-12 ENCOUNTER — Encounter: Payer: Self-pay | Admitting: Orthopaedic Surgery

## 2022-01-12 ENCOUNTER — Ambulatory Visit: Payer: Self-pay

## 2022-01-12 VITALS — BP 110/78 | HR 125 | Ht 67.0 in | Wt 138.0 lb

## 2022-01-12 DIAGNOSIS — Z981 Arthrodesis status: Secondary | ICD-10-CM | POA: Diagnosis not present

## 2022-01-12 DIAGNOSIS — M7542 Impingement syndrome of left shoulder: Secondary | ICD-10-CM

## 2022-01-12 MED ORDER — LIDOCAINE HCL 1 % IJ SOLN
0.5000 mL | INTRAMUSCULAR | Status: AC | PRN
  Administered 2022-01-12: .5 mL

## 2022-01-12 MED ORDER — METHYLPREDNISOLONE ACETATE 40 MG/ML IJ SUSP
40.0000 mg | INTRAMUSCULAR | Status: AC | PRN
  Administered 2022-01-12: 40 mg via INTRA_ARTICULAR

## 2022-01-12 MED ORDER — BUPIVACAINE HCL 0.25 % IJ SOLN
4.0000 mL | INTRAMUSCULAR | Status: AC | PRN
  Administered 2022-01-12: 4 mL via INTRA_ARTICULAR

## 2022-01-12 NOTE — Progress Notes (Signed)
Office Visit Note   Patient: Marvin Macias           Date of Birth: Aug 05, 1970           MRN: [id number] Visit Date: 01/12/2022              Requested by: No referring provider defined for this encounter. PCP: Pcp, No   Assessment & Plan: Visit Diagnoses:  1. S/P cervical spinal fusion   2. Impingement syndrome of left shoulder     Plan: Left shoulder injection performed he will call for follow-up if he has persistent problems and will need to consider MRI imaging for partial rotator cuff tear if he has persistent symptoms.  We discussed using his opposite arm for reaching and overhead activity.  Pathophysiology discussed we looked at images of the rotator cuff and discussed rotator cuff tendinopathy.  Follow-Up Instructions: No follow-ups on file.   Orders:  Orders Placed This Encounter  Procedures   Large Joint Inj: L subacromial bursa   XR Cervical Spine 2 or 3 views   No orders of the defined types were placed in this encounter.     Procedures: Large Joint Inj: L subacromial bursa on 01/12/2022 3:55 PM Indications: pain Details: 22 G 1.5 in needle  Arthrogram: No  Medications: 4 mL bupivacaine 0.25 %; 40 mg methylPREDNISolone acetate 40 MG/ML; 0.5 mL lidocaine 1 % Outcome: tolerated well, no immediate complications Procedure, treatment alternatives, risks and benefits explained, specific risks discussed. Consent was given by the patient. Immediately prior to procedure a time out was called to verify the correct patient, procedure, equipment, support staff and site/side marked as required. Patient was prepped and draped in the usual sterile fashion.       Clinical Data: No additional findings.   Subjective: Chief Complaint  Patient presents with   Neck - Pain   Left Shoulder - Pain    HPI 51 year old male chronically on antiviral Odefsey returns states he has noticed several months has not been able to abduct his left dominant arm.  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  He had previous single level cervical fusion done 04/17/2021 which healed nicely.  He did have some disc bulge at C3-4 but it was slightly more on the opposite right side which is asymptomatic.  Patient's had trouble using his right arm for reaching activities since he is left-hand dominant.  Review of Systems positive previous cervical fusion positive for tobacco abuse, depression, hypertension, HIV.   Objective: Vital Signs: BP 110/78   Pulse (!) 125   Ht '5\' 7"'$  (1.702 m)   Wt 138 lb (62.6 kg)   BMI 21.61 kg/m   Physical Exam Constitutional:      Appearance: He is well-developed.  HENT:     Head: Normocephalic and atraumatic.     Right Ear: External ear normal.     Left Ear: External ear normal.  Eyes:     Pupils: Pupils are equal, round, and reactive to light.  Neck:     Thyroid: No thyromegaly.     Trachea: No tracheal deviation.  Cardiovascular:     Rate and Rhythm: Normal rate.  Pulmonary:     Effort: Pulmonary effort is normal.     Breath sounds: No wheezing.  Abdominal:     General: Bowel sounds are normal.     Palpations:  Abdomen is soft.  Musculoskeletal:     Cervical back: Neck supple.  Skin:    General: Skin is warm and dry.     Capillary Refill: Capillary refill takes less than 2 seconds.  Neurological:     Mental Status: He is alert and oriented to person, place, and time.  Psychiatric:        Behavior: Behavior normal.        Thought Content: Thought content normal.        Judgment: Judgment normal.     Ortho Exam patient has no brachial plexus tenderness well-healed anterior cervical incision negative Sp[place].  Positive impingement left shoulder biceps tendon is normal no atrophy of the upper extremities.  Pain with resisted supraspinatus function.  Specialty Comments:  No specialty comments available.  Imaging: No results  found.   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   DIARRHEA 10/11/2006   Human immunodeficiency virus (HIV) disease ([place]) 08/09/2006   Anxiety state 08/09/2006   Depression 08/09/2006   Past Medical History:  Diagnosis Date   Anxiety    Depression    Family history of adverse reaction to anesthesia    Mother had vomiting   HIV infection ([place])    Hypertension     Family History  Problem Relation Age of Onset   Diabetes Father     Past Surgical History:  Procedure Laterality Date   ANTERIOR CERVICAL DECOMP/DISCECTOMY FUSION N/A 04/17/2020   Procedure: CERVICAL SIX- CERVICAL SEVEN  ANTERIOR CERVICAL DECOMPRESSION/DISCECTOMY FUSION, ALLOGRAFT, PLATE;  Surgeon: Marybelle Killings, MD;  Location: [facility];  Service: Orthopedics;  Laterality: N/A;   APPENDECTOMY  1998   HAND SURGERY Right 2005   Social History   Occupational History   Not on file  Tobacco Use   Smoking status: Every Day    Packs/day: 1.00    Years: 25.00    Total pack years: 25.00    Types: Cigarettes   Smokeless tobacco: Never   Tobacco comments:    cutting back  Vaping Use   Vaping Use: Never used  Substance and Sexual Activity   Alcohol use: Yes    Alcohol/week: 5.0 standard drinks of alcohol    Types: 5 Shots of liquor per week    Comment: vodka and wine daily   Drug use: Yes    Frequency: 4.0 times per week    Types: Marijuana    Comment: daily   Sexual activity: Yes     Partners: Male    Comment: given condoms

## 2022-01-17 ENCOUNTER — Other Ambulatory Visit: Payer: Self-pay

## 2022-01-17 DIAGNOSIS — B20 Human immunodeficiency virus [HIV] disease: Secondary | ICD-10-CM

## 2022-01-17 MED ORDER — ODEFSEY 200-25-25 MG PO TABS: 1.0000 | ORAL_TABLET | Freq: Every day | ORAL | 1 refills | Status: DC

## 2022-03-11 ENCOUNTER — Other Ambulatory Visit: Payer: Self-pay

## 2022-03-11 ENCOUNTER — Other Ambulatory Visit: Payer: Self-pay | Admitting: Internal Medicine

## 2022-03-11 DIAGNOSIS — B20 Human immunodeficiency virus [HIV] disease: Secondary | ICD-10-CM

## 2022-03-11 MED ORDER — ODEFSEY 200-25-25 MG PO TABS: 1.0000 | ORAL_TABLET | Freq: Every day | ORAL | 0 refills | Status: DC

## 2022-03-11 NOTE — Telephone Encounter (Signed)
Okay to refill? 

## 2022-03-22 ENCOUNTER — Ambulatory Visit (INDEPENDENT_AMBULATORY_CARE_PROVIDER_SITE_OTHER): Payer: No Typology Code available for payment source | Admitting: Infectious Diseases

## 2022-03-22 ENCOUNTER — Encounter: Payer: Self-pay | Admitting: Infectious Diseases

## 2022-03-22 ENCOUNTER — Other Ambulatory Visit: Payer: Self-pay

## 2022-03-22 VITALS — BP 115/78 | HR 126 | Temp 98.6°F | Wt 151.0 lb

## 2022-03-22 DIAGNOSIS — B20 Human immunodeficiency virus [HIV] disease: Secondary | ICD-10-CM

## 2022-03-22 DIAGNOSIS — Z113 Encounter for screening for infections with a predominantly sexual mode of transmission: Secondary | ICD-10-CM

## 2022-03-22 DIAGNOSIS — R000 Tachycardia, unspecified: Secondary | ICD-10-CM

## 2022-03-22 DIAGNOSIS — R197 Diarrhea, unspecified: Secondary | ICD-10-CM

## 2022-03-22 MED ORDER — DOVATO 50-300 MG PO TABS
1.0000 | ORAL_TABLET | Freq: Every day | ORAL | 11 refills | Status: DC
Start: 2022-03-22 — End: 2023-02-07

## 2022-03-22 MED ORDER — TADALAFIL 10 MG PO TABS
10.0000 mg | ORAL_TABLET | Freq: Every day | ORAL | 5 refills | Status: DC | PRN
Start: 2022-03-22 — End: 2022-04-26

## 2022-03-22 NOTE — Patient Instructions (Addendum)
Come back to see me in 5-6 weeks on a Tuesday. Can see me or Marya Amsler or our pharmacy team. We just want to make sure you are tolerating the new medication well. Will repeat labs then and get your meningitis shot then (if you let us).   Dovato is the  - Common side effects for a short time frame usually include headaches, nausea and diarrhea - OK to take over the counter tylenol for headaches and imodium for diarrhea - Try taking with food if you are nauseated  - If you take any multivitamins or supplements please separate them from your Dovato by 6 hours before and after. The main thing is do not have them in the stomach at the same time.  Imodium (2 tabs) at night to help with any diarrhea - if this keeps happening, I want to get you set up to be seen.

## 2022-03-22 NOTE — Assessment & Plan Note (Signed)
Has been persistent in 120s from what I can tell. Apical pulse auscultated today. No murmurs appreciated. With family history of CAD recommend we consider working this up given he has some symptoms associated with it (dizziness and intermittent chest pains). He declined at this time.  Would like to refer to cardiology for better risk stratification and measures to prevent. Could start with CXR and EKG at our office I suppose but can

## 2022-03-22 NOTE — Assessment & Plan Note (Signed)
Has always had some trouble with diarrhea to some regard but no functional incontinence. This seems improved with eliminating / reducing alcohol. May have some acute worsening with switch to Dovato, but encouraged him to take 2 imodium OTC at night to see if this helps. Will otherwise plan to refer to GI for assistance and consideration for colonoscopy and other testing.

## 2022-03-22 NOTE — Progress Notes (Unsigned)
Name: Marvin Macias  DOB: [birth year] MRN: [id number] PCP: Pcp, No     Brief Narrative:  Marvin Macias is a 51 y.o. male with HIV disease, Dx 2008. History of OIs: none known. HIV RIsk: MSM   Previous Regimens: Atripla  Stribild  Odefsey >> suppressed   Genotypes: Sensitive    Subjective:  CC:  HIV follow up care. Needs cialis refill.    HPI: Doing well overall. Continues on [place] once a day with food. Requesting something that won't tear up his stomach and maybe a little safer long term.  Has had new episodes of fecal incontinence. He has reduced his alcohol intake significantly which has helped resolve this over the last few weeks. Had 2 glasses of wine each night the last two evenings and no problems. He does have urgency to where he needs to go to the bathroom frequently.  Enjoys his new job. Has occasional sexual partners - requesting refill of Cialis. No STI concerns.   Has some racing heart beats at times. Family h/o heart disease. Does have some chest pains intermittently. Continues to smoke cigarettes, has tried to quit in the past, nervous to quit d/t weight gain.    Review of Systems  Constitutional:  Negative for chills, fever, malaise/fatigue and weight loss.  HENT:  Negative for sore throat.        No dental problems  Respiratory:  Negative for cough and sputum production.   Cardiovascular:  Negative for chest pain and leg swelling.  Gastrointestinal:  Positive for diarrhea. Negative for abdominal pain and vomiting.       Fecal incontinence and urgency   Genitourinary:  Negative for dysuria and flank pain.  Musculoskeletal:  Negative for joint pain, myalgias and neck pain.  Skin:  Negative for rash.  Neurological:  Negative for dizziness, tingling and headaches.  Psychiatric/Behavioral:  Negative for depression and substance abuse. The patient is not nervous/anxious and does not have insomnia.      Past Medical History:  Diagnosis Date   Anxiety     Depression    Family history of adverse reaction to anesthesia    Mother had vomiting   HIV infection ([place])    Hypertension     Outpatient Medications Prior to Visit  Medication Sig Dispense Refill   Crofelemer (MYTESI) 125 MG TBEC TAKE 1 TABLET BY MOUTH TWICE DAILY AT 10:00AM AND 5:00PM 60 tablet 11   doxycycline (VIBRA-TABS) 100 MG tablet Take 1 tablet (100 mg total) by mouth 2 (two) times daily. 14 tablet 0   DULoxetine (CYMBALTA) 60 MG capsule TAKE 2 CAPSULES(120 MG) BY MOUTH DAILY 60 capsule 1   valACYclovir (VALTREX) 1000 MG tablet TAKE 1 TABLET(1000 MG) BY MOUTH TWICE DAILY 60 tablet 11   valsartan-hydrochlorothiazide (DIOVAN-HCT) 80-12.5 MG tablet TAKE 1 TABLET BY MOUTH DAILY 30 tablet 11   emtricitabine-rilpivir-tenofovir AF (ODEFSEY) 200-25-25 MG TABS tablet Take 1 tablet by mouth daily with breakfast. 30 tablet 0   tadalafil (CIALIS) 10 MG tablet Take 1 tablet (10 mg total) by mouth daily as needed for erectile dysfunction. 30 tablet 1   imiquimod (ALDARA) 5 % cream Apply topically 3 (three) times a week. (Patient not taking: Reported on 04/20/2021) 12 each 0   nicotine (NICODERM CQ - DOSED IN MG/24 HOURS) 14 mg/24hr patch Place 1 patch (14 mg total) onto the skin daily. (Patient not taking: Reported on 04/20/2021) 7 patch 0   oxyCODONE-acetaminophen (PERCOCET/ROXICET) 5-325 MG tablet Take 1 tablet by mouth  every 4 (four) hours as needed for severe pain. (Patient not taking: Reported on 03/22/2022) 40 tablet 0   No facility-administered medications prior to visit.     Allergies  Allergen Reactions   Codeine Nausea And Vomiting   Erythromycin Swelling   Hydrocodone-Acetaminophen Nausea And Vomiting   Latex Itching    Burning   Niacin Swelling   Penicillins Hives    Social History   Tobacco Use   Smoking status: Every Day    Packs/day: 1.00    Years: 25.00    Total pack years: 25.00    Types: Cigarettes   Smokeless tobacco: Never   Tobacco comments:    cutting  back  Vaping Use   Vaping Use: Never used  Substance Use Topics   Alcohol use: Yes    Alcohol/week: 5.0 standard drinks of alcohol    Types: 5 Shots of liquor per week    Comment: vodka and wine daily   Drug use: Yes    Frequency: 4.0 times per week    Types: Marijuana    Comment: daily    Social History   Substance and Sexual Activity  Sexual Activity Yes   Partners: Male   Comment: given condoms     Objective:   Vitals:   03/22/22 1338  BP: 115/78  Pulse: (!) 126  Temp: 98.6 F (37 C)  TempSrc: Oral  SpO2: 98%  Weight: 151 lb (68.5 kg)    Body mass index is 23.65 kg/m.  Physical Exam Constitutional:      Appearance: Normal appearance. He is not ill-appearing.  HENT:     Head: Normocephalic.     Mouth/Throat:     Mouth: Mucous membranes are moist.     Pharynx: Oropharynx is clear.  Eyes:     General: No scleral icterus. Cardiovascular:     Rate and Rhythm: Regular rhythm. Tachycardia present.  Pulmonary:     Effort: Pulmonary effort is normal.  Musculoskeletal:        General: Normal range of motion.     Cervical back: Normal range of motion.  Skin:    Coloration: Skin is not jaundiced or pale.  Neurological:     Mental Status: He is alert and oriented to person, place, and time.  Psychiatric:        Mood and Affect: Mood normal.        Judgment: Judgment normal.     Lab Results Lab Results  Component Value Date   WBC 10.7 03/22/2022   HGB 17.1 03/22/2022   HCT 49.8 03/22/2022   MCV 104.2 (H) 03/22/2022   PLT 326 03/22/2022    Lab Results  Component Value Date   CREATININE 0.82 03/22/2022   BUN 12 03/22/2022   NA 139 03/22/2022   K 4.0 03/22/2022   CL 103 03/22/2022   CO2 27 03/22/2022    Lab Results  Component Value Date   ALT 14 03/22/2022   AST 14 03/22/2022   ALKPHOS 43 04/08/2020   BILITOT 0.5 03/22/2022    Lab Results  Component Value Date   CHOL 187 09/24/2020   HDL 54 09/24/2020   LDLCALC 107 (H) 09/24/2020    TRIG 146 09/24/2020   CHOLHDL 3.5 09/24/2020   HIV 1 RNA Quant  Date Value  09/24/2020 Not Detected Copies/mL  08/13/2019 26 copies/mL (H)  08/06/2018 69 copies/mL (H)   CD4 T Cell Abs (/uL)  Date Value  03/22/2022 939  09/24/2020 712  08/13/2019 998  Assessment & Plan:   Patient Active Problem List   Diagnosis Date Noted   Tachycardia 03/22/2022   Impingement syndrome of left shoulder 01/12/2022   Drug-induced erectile dysfunction 07/27/2021   Healthcare maintenance 04/26/2021   S/P cervical spinal fusion 04/24/2020   Cervical spinal stenosis 04/17/2020   Neck pain, bilateral 11/28/2019   Pain of cervical spine 11/28/2019   Chronic right shoulder pain 09/23/2019   HTN (hypertension) 04/20/2016   Allergic sinusitis 12/09/2013   Skin cancer, basal cell 04/16/2013   TOBACCO USER 02/05/2007   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 They all typically have at least lead in GI side effects but I think dovato would be good for him. No hep b coinfection. No h/o integrase use in the past. Will switch and have him back ~5-6 weeks to reassess.  No concerns with access or adherence to medication. They are tolerating the medication well without side effects. No drug interactions identified. Pertinent lab tests ordered today.  Reminded about ADAP re-enrollment in July / January.  No changes to insurance coverage.  No dental needs today.  No concern over anxious/depressed mood.  Sexual health and family planning discussed - routine asymptomatic urine screening today.  Vaccines deferred today per  patient - will do flu and menveo next visit.   Return in about 5 weeks (around 04/26/2022).        Relevant Medications   dolutegravir-lamiVUDine (DOVATO) 50-300 MG tablet   Other Relevant Orders   CBC (Completed)   COMPLETE METABOLIC PANEL WITH GFR (Completed)   HIV 1 RNA quant-no reflex-bld   T-helper cells (CD4) count (Completed)   Tachycardia    Has been persistent in 120s from what I can tell. Apical pulse auscultated today. No murmurs appreciated. With family history of CAD recommend we consider working this up given he has some symptoms associated with it (dizziness and intermittent chest pains). CV risk score high given HTN treatment, age, smoker and HIV(+).  Would like to refer to cardiology for better risk stratification, understand current state of CAD and measures to prevent.  He declined at this time.  Could start with CXR and EKG at our office but with CPs described he would need stress test as well I expect. Will see if he is open to starting statin treatment given results of REPRIEVE study that was released.       DIARRHEA    Has always had some trouble with diarrhea to some regard but no functional incontinence. This seems improved with eliminating / reducing alcohol. May have some acute worsening with switch to Dovato, but encouraged him to take 2 imodium OTC at night to see if this helps. Will otherwise plan to refer to GI for assistance and consideration for colonoscopy and other testing.       Other Visit Diagnoses     Screening for STDs (sexually transmitted diseases)    -  Primary   Relevant Orders   RPR (Completed)   Urine cytology ancillary only (Completed)      Janene Madeira, MSN, NP-[facility]  [place] Pager: [phone] Office: [phone]  03/24/22  12:44 PM

## 2022-03-23 LAB — T-HELPER CELLS (CD4) COUNT (NOT AT ARMC)
CD4 % Helper T Cell: 40 % (ref 33–65)
CD4 T Cell Abs: 939 /uL (ref 400–1790)

## 2022-03-23 NOTE — Assessment & Plan Note (Signed)
Very well controlled on once daily Odefsey but he desires switch to alternative more flexible pill that may help lessen GI side effects. They all typically have at least lead in GI side effects but I think dovato would be good for him. No hep b coinfection. No h/o integrase use in the past. Will switch and have him back ~5-6 weeks to reassess.  No concerns with access or adherence to medication. They are tolerating the medication well without side effects. No drug interactions identified. Pertinent lab tests ordered today.  Reminded about ADAP re-enrollment in July / January.  No changes to insurance coverage.  No dental needs today.  No concern over anxious/depressed mood.  Sexual health and family planning discussed - routine asymptomatic urine screening today.  Vaccines deferred today per patient - will do flu and menveo next visit.   Return in about 5 weeks (around 04/26/2022).

## 2022-03-24 LAB — URINE CYTOLOGY ANCILLARY ONLY
Chlamydia: NEGATIVE
Comment: NEGATIVE
Comment: NORMAL
Neisseria Gonorrhea: NEGATIVE

## 2022-03-25 LAB — COMPLETE METABOLIC PANEL WITH GFR
AG Ratio: 2 (calc) (ref 1.0–2.5)
ALT: 14 U/L (ref 9–46)
AST: 14 U/L (ref 10–35)
Albumin: 4.4 g/dL (ref 3.6–5.1)
Alkaline phosphatase (APISO): 64 U/L (ref 35–144)
BUN: 12 mg/dL (ref 7–25)
CO2: 27 mmol/L (ref 20–32)
Calcium: 9.9 mg/dL (ref 8.6–10.3)
Chloride: 103 mmol/L (ref 98–110)
Creat: 0.82 mg/dL (ref 0.70–1.30)
Globulin: 2.2 g/dL (calc) (ref 1.9–3.7)
Glucose, Bld: 90 mg/dL (ref 65–99)
Potassium: 4 mmol/L (ref 3.5–5.3)
Sodium: 139 mmol/L (ref 135–146)
Total Bilirubin: 0.5 mg/dL (ref 0.2–1.2)
Total Protein: 6.6 g/dL (ref 6.1–8.1)
eGFR: 105 mL/min/{1.73_m2} (ref >=60)

## 2022-03-25 LAB — CBC
HCT: 49.8 % (ref 38.5–50.0)
Hemoglobin: 17.1 g/dL (ref 13.2–17.1)
MCH: 35.8 pg — ABNORMAL HIGH (ref 27.0–33.0)
MCHC: 34.3 g/dL (ref 32.0–36.0)
MCV: 104.2 fL — ABNORMAL HIGH (ref 80.0–100.0)
MPV: 10.4 fL (ref 7.5–12.5)
Platelets: 326 10*3/uL (ref 140–400)
RBC: 4.78 10*6/uL (ref 4.20–5.80)
RDW: 12.7 % (ref 11.0–15.0)
WBC: 10.7 10*3/uL (ref 3.8–10.8)

## 2022-03-25 LAB — RPR: RPR Ser Ql: NONREACTIVE

## 2022-03-25 LAB — HIV-1 RNA QUANT-NO REFLEX-BLD
HIV 1 RNA Quant: NOT DETECTED Copies/mL
HIV-1 RNA Quant, Log: NOT DETECTED Log cps/mL

## 2022-04-23 ENCOUNTER — Other Ambulatory Visit: Payer: Self-pay | Admitting: Infectious Diseases

## 2022-04-25 NOTE — Telephone Encounter (Signed)
Appt 10/24

## 2022-04-26 ENCOUNTER — Encounter: Payer: Self-pay | Admitting: Infectious Diseases

## 2022-04-26 ENCOUNTER — Ambulatory Visit: Payer: No Typology Code available for payment source | Admitting: Infectious Diseases

## 2022-04-26 ENCOUNTER — Other Ambulatory Visit: Payer: Self-pay

## 2022-04-26 VITALS — BP 129/87 | HR 101 | Temp 98.3°F | Ht 67.0 in | Wt 149.0 lb

## 2022-04-26 DIAGNOSIS — R000 Tachycardia, unspecified: Secondary | ICD-10-CM

## 2022-04-26 DIAGNOSIS — Z23 Encounter for immunization: Secondary | ICD-10-CM | POA: Diagnosis not present

## 2022-04-26 DIAGNOSIS — Z0000 Encounter for general adult medical examination without abnormal findings: Secondary | ICD-10-CM

## 2022-04-26 DIAGNOSIS — M4802 Spinal stenosis, cervical region: Secondary | ICD-10-CM | POA: Diagnosis not present

## 2022-04-26 DIAGNOSIS — N522 Drug-induced erectile dysfunction: Secondary | ICD-10-CM | POA: Diagnosis not present

## 2022-04-26 DIAGNOSIS — B20 Human immunodeficiency virus [HIV] disease: Secondary | ICD-10-CM | POA: Diagnosis not present

## 2022-04-26 MED ORDER — TADALAFIL 10 MG PO TABS: 10.0000 mg | ORAL_TABLET | Freq: Every day | ORAL | 5 refills | Status: DC | PRN

## 2022-04-26 MED ORDER — DULOXETINE HCL 60 MG PO CPEP: ORAL_CAPSULE | ORAL | 5 refills | Status: AC

## 2022-04-26 NOTE — Assessment & Plan Note (Signed)
Seems to be tolerating the switch from [facility] to [place].  He likes the freedom from having to take to his medicine with food and would like to continue this.  Repeat viral load today given [facility] switch.  He can return in 6 months to follow-up on blood pressure and other health conditions.  Flu shot given today.  Declined COVID-vaccine.

## 2022-04-26 NOTE — Assessment & Plan Note (Signed)
Flu shot given today. Declined covid vaccine.

## 2022-04-26 NOTE — Assessment & Plan Note (Signed)
Refills of cialis provided for PRN use.

## 2022-04-26 NOTE — Progress Notes (Signed)
Name: Marvin Macias  DOB: 1971/03/14 MRN: [id number] PCP: Pcp, No     Brief Narrative:  Marvin Macias is a 51 y.o. male with HIV disease, Dx 2008. History of OIs: none known. HIV RIsk: MSM   Previous Regimens: Atripla  Stribild  Odefsey >> suppressed   Genotypes: Sensitive    Subjective:  CC:  HIV follow up care. Needs cialis refill and cymbalta.     HPI: Has done well with the dovato switch.  Initially noticed some headaches when he was getting used to it but these have pretty much resolved.  He wonders what his heart rate is today and it is still very high.  He has a access to blood pressure cuff monitor at home.  Requesting a refill on the Cymbalta and wonders if we can increase the dose.  He has noticed that since he has been in a new job where he requires more physical movement and lifting of objects he has had worsening numbness/nerve pain down the left arm and fingertips.  Unfortunately C-spine surgery did not completely correct this for him.  Requesting a refill of Cialis, he never received it from his local pharmacy in [place].  He is willing to receive the flu shot today but declines COVID-vaccine booster.   Review of Systems  Constitutional:  Negative for chills and fever.  HENT:  Negative for tinnitus.   Eyes:  Negative for blurred vision and photophobia.  Respiratory:  Negative for cough and sputum production.   Cardiovascular:  Negative for chest pain.  Gastrointestinal:  Positive for diarrhea. Negative for nausea and vomiting.  Genitourinary:  Negative for dysuria.  Skin:  Negative for rash.  Neurological:  Positive for headaches.     Past Medical History:  Diagnosis Date   Anxiety    Depression    Family history of adverse reaction to anesthesia    Mother had vomiting   HIV infection ([place])    Hypertension     Outpatient Medications Prior to Visit  Medication Sig Dispense Refill   Crofelemer (MYTESI) 125 MG TBEC TAKE 1 TABLET BY MOUTH  TWICE DAILY AT 10:00AM AND 5:00PM 60 tablet 11   dolutegravir-lamiVUDine (DOVATO) 50-300 MG tablet Take 1 tablet by mouth daily. 30 tablet 11   valACYclovir (VALTREX) 1000 MG tablet TAKE 1 TABLET(1000 MG) BY MOUTH TWICE DAILY 60 tablet 11   valsartan-hydrochlorothiazide (DIOVAN-HCT) 80-12.5 MG tablet TAKE 1 TABLET BY MOUTH DAILY 30 tablet 11   DULoxetine (CYMBALTA) 60 MG capsule TAKE 2 CAPSULES(120 MG) BY MOUTH DAILY 60 capsule 1   tadalafil (CIALIS) 10 MG tablet Take 1 tablet (10 mg total) by mouth daily as needed for erectile dysfunction. 30 tablet 5   nicotine (NICODERM CQ - DOSED IN MG/24 HOURS) 14 mg/24hr patch Place 1 patch (14 mg total) onto the skin daily. (Patient not taking: Reported on 04/20/2021) 7 patch 0   doxycycline (VIBRA-TABS) 100 MG tablet Take 1 tablet (100 mg total) by mouth 2 (two) times daily. (Patient not taking: Reported on 04/26/2022) 14 tablet 0   imiquimod (ALDARA) 5 % cream Apply topically 3 (three) times a week. (Patient not taking: Reported on 04/20/2021) 12 each 0   No facility-administered medications prior to visit.     Allergies  Allergen Reactions   Codeine Nausea And Vomiting   Erythromycin Swelling   Hydrocodone-Acetaminophen Nausea And Vomiting   Latex Itching    Burning   Niacin Swelling   Penicillins Hives    Social History  Tobacco Use   Smoking status: Every Day    Packs/day: 1.00    Years: 25.00    Total pack years: 25.00    Types: Cigarettes   Smokeless tobacco: Never  Vaping Use   Vaping Use: Never used  Substance Use Topics   Alcohol use: Yes    Alcohol/week: 5.0 standard drinks of alcohol    Types: 5 Shots of liquor per week    Comment: vodka and wine daily   Drug use: Yes    Frequency: 4.0 times per week    Types: Marijuana    Comment: daily    Social History   Substance and Sexual Activity  Sexual Activity Yes   Partners: Male   Comment: given condoms     Objective:   Vitals:   04/26/22 1435  BP: 129/87   Pulse: (!) 101  Temp: 98.3 F (36.8 C)  TempSrc: Oral  SpO2: 94%  Weight: 149 lb (67.6 kg)  Height: '5\' 7"'$  (1.702 m)    Body mass index is 23.34 kg/m.  Physical Exam Constitutional:      Appearance: Normal appearance. He is not ill-appearing.  HENT:     Head: Normocephalic.     Mouth/Throat:     Mouth: Mucous membranes are moist.     Pharynx: Oropharynx is clear.  Eyes:     General: No scleral icterus. Cardiovascular:     Rate and Rhythm: Regular rhythm. Tachycardia present.  Pulmonary:     Effort: Pulmonary effort is normal.  Musculoskeletal:        General: Normal range of motion.     Cervical back: Normal range of motion.  Skin:    Coloration: Skin is not jaundiced or pale.  Neurological:     Mental Status: He is alert and oriented to person, place, and time.  Psychiatric:        Mood and Affect: Mood normal.        Judgment: Judgment normal.     Lab Results Lab Results  Component Value Date   WBC 10.7 03/22/2022   HGB 17.1 03/22/2022   HCT 49.8 03/22/2022   MCV 104.2 (H) 03/22/2022   PLT 326 03/22/2022    Lab Results  Component Value Date   CREATININE 0.82 03/22/2022   BUN 12 03/22/2022   NA 139 03/22/2022   K 4.0 03/22/2022   CL 103 03/22/2022   CO2 27 03/22/2022    Lab Results  Component Value Date   ALT 14 03/22/2022   AST 14 03/22/2022   ALKPHOS 43 04/08/2020   BILITOT 0.5 03/22/2022    Lab Results  Component Value Date   CHOL 187 09/24/2020   HDL 54 09/24/2020   LDLCALC 107 (H) 09/24/2020   TRIG 146 09/24/2020   CHOLHDL 3.5 09/24/2020   HIV 1 RNA Quant  Date Value  03/22/2022 Not Detected Copies/mL  09/24/2020 Not Detected Copies/mL  08/13/2019 26 copies/mL (H)   CD4 T Cell Abs (/uL)  Date Value  03/22/2022 939  09/24/2020 712  08/13/2019 998     Assessment & Plan:   Patient Active Problem List   Diagnosis Date Noted   Tachycardia 03/22/2022   Impingement syndrome of left shoulder 01/12/2022   Drug-induced erectile  dysfunction 07/27/2021   Healthcare maintenance 04/26/2021   S/P cervical spinal fusion 04/24/2020   Cervical spinal stenosis 04/17/2020   Neck pain, bilateral 11/28/2019   Pain of cervical spine 11/28/2019   Chronic right shoulder pain 09/23/2019   HTN (hypertension)  04/20/2016   Allergic sinusitis 12/09/2013   Skin cancer, basal cell 04/16/2013   TOBACCO USER 02/05/2007   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  He likes the freedom from having to take to his medicine with food and would like to continue this.  Repeat viral load today given ARV switch.  He can return in 6 months to follow-up on blood pressure and other health conditions.  Flu shot given today.  Declined COVID-vaccine.      Relevant Orders   HIV 1 RNA quant-no reflex-bld   Flu Vaccine QUAD 64moIM (Fluarix, Fluzone & Alfiuria Quad PF) (Completed)   Cervical spinal stenosis    He is on max dose of Cymbalta.  Have tried gabapentin for him in the past which he did not tolerate well.  Has had nsgy intervention with c-spine stenosis. Would recommend pain clinic evaluation to see if there is anything else we could offer to help with pain control.       Relevant Orders   Ambulatory referral to PBarnummaintenance    Flu shot given today. Declined covid vaccine.       Drug-induced erectile dysfunction    Refills of cialis provided for PRN use.       Tachycardia    HR elevated in the office - could be 2/2 to anxiety about coming here for visits and not pathological. Will see what his HR measures at home and if resting HR > 110 consistently would consider cardiology evaluation given  family history of MI.       Other Visit Diagnoses     Health care maintenance       Relevant Orders   Flu Vaccine QUAD 651moM (Fluarix, Fluzone & Alfiuria Quad PF) (Completed)      StJanene MadeiraMSN, NP-C [facility]: [phone]: [phone]/24/23  4:28 PM

## 2022-04-26 NOTE — Assessment & Plan Note (Signed)
He is on max dose of Cymbalta.  Have tried gabapentin for him in the past which he did not tolerate well.  Has had nsgy intervention with c-spine stenosis. Would recommend pain clinic evaluation to see if there is anything else we could offer to help with pain control.

## 2022-04-26 NOTE — Patient Instructions (Addendum)
Password has been reset to 'welcome' for you   Will work on getting you to see a pain clinic to get an opinion about the nerve pain in the left arm and neck.   Please check your pulse at home with your blood pressure monitor after sitting in the chair for 5 min. If your heart rate is still > 110 at rest I would recommend we get you to see cardiology to help figure out why.   Refills have been provided for your cialis and the cymbalta.   Will see you in 6 months to check in with your blood pressure and heart rate and such.

## 2022-04-26 NOTE — Assessment & Plan Note (Signed)
HR elevated in the office - could be 2/2 to anxiety about coming here for visits and not pathological. Will see what his HR measures at home and if resting HR > 110 consistently would consider cardiology evaluation given family history of MI.

## 2022-04-28 LAB — HIV-1 RNA QUANT-NO REFLEX-BLD
HIV 1 RNA Quant: <20 Copies/mL — ABNORMAL HIGH
HIV-1 RNA Quant, Log: <1.3 Log cps/mL — ABNORMAL HIGH

## 2022-05-11 ENCOUNTER — Other Ambulatory Visit: Payer: Self-pay | Admitting: Infectious Diseases

## 2022-05-11 DIAGNOSIS — B029 Zoster without complications: Secondary | ICD-10-CM

## 2022-05-11 DIAGNOSIS — R197 Diarrhea, unspecified: Secondary | ICD-10-CM

## 2022-05-11 NOTE — Telephone Encounter (Signed)
Okay to refill? 

## 2022-05-11 NOTE — Telephone Encounter (Signed)
Refills sent in

## 2022-06-07 ENCOUNTER — Other Ambulatory Visit: Payer: Self-pay | Admitting: Infectious Diseases

## 2022-06-07 NOTE — Telephone Encounter (Signed)
Okay to refill for 1 year? Thanks!

## 2022-07-07 ENCOUNTER — Other Ambulatory Visit (HOSPITAL_COMMUNITY): Payer: Self-pay

## 2022-08-10 ENCOUNTER — Other Ambulatory Visit (HOSPITAL_COMMUNITY)
Admission: RE | Admit: 2022-08-10 | Discharge: 2022-08-10 | Disposition: A | Discharge status: Home / Self Care | Payer: Commercial Managed Care - HMO | Source: Ambulatory Visit | Attending: Infectious Diseases | Admitting: Infectious Diseases

## 2022-08-10 ENCOUNTER — Encounter: Payer: Self-pay | Admitting: Infectious Diseases

## 2022-08-10 ENCOUNTER — Other Ambulatory Visit: Payer: Self-pay

## 2022-08-10 ENCOUNTER — Telehealth: Payer: Self-pay

## 2022-08-10 ENCOUNTER — Ambulatory Visit: Payer: Commercial Managed Care - HMO | Admitting: Infectious Diseases

## 2022-08-10 VITALS — BP 147/99 | HR 117 | Temp 99.6°F | Wt 150.4 lb

## 2022-08-10 DIAGNOSIS — N5082 Scrotal pain: Secondary | ICD-10-CM | POA: Diagnosis present

## 2022-08-10 DIAGNOSIS — L0291 Cutaneous abscess, unspecified: Secondary | ICD-10-CM | POA: Insufficient documentation

## 2022-08-10 MED ORDER — DOXYCYCLINE HYCLATE 100 MG PO CAPS: 100.0000 mg | ORAL_CAPSULE | Freq: Two times a day (BID) | ORAL | 0 refills | Status: DC

## 2022-08-10 MED ORDER — DOXYCYCLINE HYCLATE 100 MG PO TABS
100.0000 mg | ORAL_TABLET | Freq: Two times a day (BID) | ORAL | 0 refills | Status: DC
  Filled 2022-08-10: qty 14, 7d supply, fill #0

## 2022-08-10 NOTE — Progress Notes (Signed)
Name: Marvin Macias  DOB: 1971-01-04 MRN: [id number] PCP: Pcp, No     Brief Narrative:  TOREZ BEAUREGARD is a 52 y.o. male with HIV disease, Dx 2008. History of OIs: none known. HIV RIsk: MSM   Previous Regimens: Atripla  Stribild  Odefsey >> suppressed   Genotypes: Sensitive    Subjective:  CC:  HIV follow up care. Needs cialis refill and cymbalta.     HPI:  Has had a bump on penis come and go for about a year. Shaved and it irritated.  Has had a few sharp pains in the left teste as well. No changes to size, shape or color of skin. Potential for possible STI exposure.   No fevers/chills. No scrotal or perineal heaviness. No lymphadenopathy.    Review of Systems  Constitutional:  Negative for chills and fever.  HENT:  Negative for tinnitus.   Eyes:  Negative for blurred vision and photophobia.  Respiratory:  Negative for cough and sputum production.   Cardiovascular:  Negative for chest pain.  Gastrointestinal:  Positive for diarrhea. Negative for nausea and vomiting.  Genitourinary:  Negative for dysuria.  Skin:  Negative for rash.  Neurological:  Positive for headaches.    Past Medical History:  Diagnosis Date   Anxiety    Depression    Family history of adverse reaction to anesthesia    Mother had vomiting   HIV infection ([place])    Hypertension     Outpatient Medications Prior to Visit  Medication Sig Dispense Refill   DENTA 5000 PLUS 1.1 % CREA dental cream Take by mouth.     dolutegravir-lamiVUDine (DOVATO) 50-300 MG tablet Take 1 tablet by mouth daily. 30 tablet 11   DULoxetine (CYMBALTA) 60 MG capsule TAKE 2 CAPSULES(120 MG) BY MOUTH DAILY 60 capsule 5   tadalafil (CIALIS) 10 MG tablet Take 1 tablet (10 mg total) by mouth daily as needed for erectile dysfunction. 30 tablet 5   valACYclovir (VALTREX) 1000 MG tablet TAKE 1 TABLET(1000 MG) BY MOUTH TWICE DAILY 60 tablet 11   valsartan-hydrochlorothiazide (DIOVAN-HCT) 80-12.5 MG tablet TAKE 1 TABLET BY  MOUTH DAILY 30 tablet 11   Crofelemer (MYTESI) 125 MG TBEC TAKE 1 TABLET BY MOUTH TWICE DAILY( 10AM AND 5PM) (Patient not taking: Reported on 08/10/2022) 60 tablet 11   nicotine (NICODERM CQ - DOSED IN MG/24 HOURS) 14 mg/24hr patch Place 1 patch (14 mg total) onto the skin daily. (Patient not taking: Reported on 04/20/2021) 7 patch 0   No facility-administered medications prior to visit.     Allergies  Allergen Reactions   Codeine Nausea And Vomiting   Erythromycin Swelling   Hydrocodone-Acetaminophen Nausea And Vomiting   Latex Itching    Burning   Niacin Swelling   Penicillins Hives    Social History   Tobacco Use   Smoking status: Every Day    Packs/day: 1.00    Years: 25.00    Total pack years: 25.00    Types: Cigarettes   Smokeless tobacco: Never  Vaping Use   Vaping Use: Never used  Substance Use Topics   Alcohol use: Yes    Alcohol/week: 5.0 standard drinks of alcohol    Types: 5 Shots of liquor per week    Comment: vodka and wine daily   Drug use: Yes    Frequency: 4.0 times per week    Types: Marijuana    Comment: daily    Social History   Substance and Sexual Activity  Sexual  Activity Yes   Partners: Male   Comment: given condoms     Objective:   Vitals:   08/10/22 1352  BP: (!) 147/99  Pulse: (!) 117  Temp: 99.6 F (37.6 C)  TempSrc: Oral  SpO2: 96%  Weight: 150 lb 6.4 oz (68.2 kg)    Body mass index is 23.56 kg/m.  Physical Exam Genitourinary:      Comments: Red firm nodule 1cm in diameter, TTP+ Skin:    General: Skin is warm.     Capillary Refill: Capillary refill takes less than 2 seconds.     Findings: Rash present.     Lab Results Lab Results  Component Value Date   WBC 10.7 03/22/2022   HGB 17.1 03/22/2022   HCT 49.8 03/22/2022   MCV 104.2 (H) 03/22/2022   PLT 326 03/22/2022    Lab Results  Component Value Date   CREATININE 0.82 03/22/2022   BUN 12 03/22/2022   NA 139 03/22/2022   K 4.0 03/22/2022   CL 103  03/22/2022   CO2 27 03/22/2022    Lab Results  Component Value Date   ALT 14 03/22/2022   AST 14 03/22/2022   ALKPHOS 43 04/08/2020   BILITOT 0.5 03/22/2022    Lab Results  Component Value Date   CHOL 187 09/24/2020   HDL 54 09/24/2020   LDLCALC 107 (H) 09/24/2020   TRIG 146 09/24/2020   CHOLHDL 3.5 09/24/2020   HIV 1 RNA Quant (Copies/mL)  Date Value  04/26/2022 <20 (H)  03/22/2022 Not Detected  09/24/2020 Not Detected   CD4 T Cell Abs (/uL)  Date Value  03/22/2022 939  09/24/2020 712  08/13/2019 998     Assessment & Plan:   Problem List Items Addressed This Visit       Unprioritized   Abscess of skin - Primary    Flat appearing red firm deeply seated nodule on left shaft of penis. Tender. Suspect cyst that has become infected. Will do 7d course of doxycycline (PCN urticaria allergy reported and no h/o cephalosporins - leery to try). Warm compresses 3-4 times a day.  Call if no improvement.       Relevant Medications   doxycycline (VIBRA-TABS) 100 MG tablet   Scrotal pain    Exam normal - check Urine cytology to ensure no bacterial STI.       Relevant Orders   Urine cytology ancillary only   Urinalysis   Janene Madeira, MSN, NP-C [facility] Pager: [phone] Office: [phone]  08/10/22  3:37 PM

## 2022-08-10 NOTE — Assessment & Plan Note (Signed)
Exam normal - check Urine cytology to ensure no bacterial STI.

## 2022-08-10 NOTE — Patient Instructions (Signed)
Warm compresses to the skin 3-4 times a day  DOXYCYCLINE Instructions:  Take twice a day with food (breakfast and dinner) Make sure you drink a full 8 oz glass of water with each dose Sit upright for 1 hour after to prevent heartburn Careful in the sun - can make you more at risk for sun burns (bad ones...) Limit sun exposure between 10-4 Sun block! Don't forget to reapply it Hats Longer sleeves/pants    Finish the antibiotic - if no improvement let me know.

## 2022-08-10 NOTE — Telephone Encounter (Signed)
Received a call from patient stating that he woke up this morning  and noticed that he had a knot on the shaft of his private area. It's red swollen and painful and he thinks it's an ingrown hair that has been there for a while.

## 2022-08-10 NOTE — Assessment & Plan Note (Signed)
Flat appearing red firm deeply seated nodule on left shaft of penis. Tender. Suspect cyst that has become infected. Will do 7d course of doxycycline (PCN urticaria allergy reported and no h/o cephalosporins - leery to try). Warm compresses 3-4 times a day.  Call if no improvement.

## 2022-08-11 LAB — URINE CYTOLOGY ANCILLARY ONLY
Chlamydia: NEGATIVE
Comment: NEGATIVE
Comment: NORMAL
Neisseria Gonorrhea: NEGATIVE

## 2022-08-11 LAB — URINALYSIS
Bilirubin Urine: NEGATIVE
Glucose, UA: NEGATIVE
Hgb urine dipstick: NEGATIVE
Ketones, ur: NEGATIVE
Nitrite: NEGATIVE
Specific Gravity, Urine: 1.027 (ref 1.001–1.035)
pH: 6.5 (ref 5.0–8.0)

## 2022-08-12 DIAGNOSIS — L0291 Cutaneous abscess, unspecified: Secondary | ICD-10-CM

## 2022-08-12 DIAGNOSIS — N5082 Scrotal pain: Secondary | ICD-10-CM

## 2022-08-15 ENCOUNTER — Encounter: Payer: Self-pay | Admitting: Infectious Diseases

## 2022-10-04 ENCOUNTER — Other Ambulatory Visit: Payer: Self-pay | Admitting: Infectious Diseases

## 2022-11-01 ENCOUNTER — Encounter: Payer: Self-pay | Admitting: Infectious Diseases

## 2022-11-01 ENCOUNTER — Ambulatory Visit: Payer: No Typology Code available for payment source | Admitting: Infectious Diseases

## 2022-11-01 ENCOUNTER — Other Ambulatory Visit: Payer: Self-pay

## 2022-11-01 ENCOUNTER — Ambulatory Visit (INDEPENDENT_AMBULATORY_CARE_PROVIDER_SITE_OTHER): Payer: Commercial Managed Care - HMO | Admitting: Infectious Diseases

## 2022-11-01 VITALS — BP 110/75 | HR 102 | Temp 98.5°F | Resp 24 | Ht 67.0 in | Wt 149.8 lb

## 2022-11-01 DIAGNOSIS — F17200 Nicotine dependence, unspecified, uncomplicated: Secondary | ICD-10-CM

## 2022-11-01 DIAGNOSIS — Z79899 Other long term (current) drug therapy: Secondary | ICD-10-CM

## 2022-11-01 DIAGNOSIS — A630 Anogenital (venereal) warts: Secondary | ICD-10-CM | POA: Diagnosis not present

## 2022-11-01 DIAGNOSIS — L02818 Cutaneous abscess of other sites: Secondary | ICD-10-CM

## 2022-11-01 DIAGNOSIS — Z981 Arthrodesis status: Secondary | ICD-10-CM

## 2022-11-01 DIAGNOSIS — B20 Human immunodeficiency virus [HIV] disease: Secondary | ICD-10-CM

## 2022-11-01 DIAGNOSIS — Z113 Encounter for screening for infections with a predominantly sexual mode of transmission: Secondary | ICD-10-CM

## 2022-11-01 DIAGNOSIS — I158 Other secondary hypertension: Secondary | ICD-10-CM | POA: Diagnosis not present

## 2022-11-01 DIAGNOSIS — N529 Male erectile dysfunction, unspecified: Secondary | ICD-10-CM

## 2022-11-01 DIAGNOSIS — F17210 Nicotine dependence, cigarettes, uncomplicated: Secondary | ICD-10-CM | POA: Diagnosis not present

## 2022-11-01 MED ORDER — IBUPROFEN 800 MG PO TABS
800.0000 mg | ORAL_TABLET | Freq: Three times a day (TID) | ORAL | 0 refills | Status: AC | PRN
Start: 2022-11-01 — End: ?

## 2022-11-01 MED ORDER — TADALAFIL 10 MG PO TABS: 10.0000 mg | ORAL_TABLET | Freq: Every day | ORAL | 5 refills | Status: DC | PRN

## 2022-11-01 NOTE — Assessment & Plan Note (Signed)
Encouraged to f/u with ortho (esp with radicular pain in his L hand) but he is hesitant

## 2022-11-01 NOTE — Progress Notes (Signed)
   Subjective:    Patient ID: Marvin Macias, male  DOB: October 15, 1970, 52 y.o.        MRN: [id number]   HPI 52 yo M with HIV+, rectal warts, anxiety d/o. R radial fracture with ORIF October 2011. No issues with  On atripla --> complera --> odefsy (07-2015) --> dovato. 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 No dysplasia.  Had cervical fusion (C6-7) 2021. (Dr Ophelia Charter). Still has some pain in his neck, numbness in his L hand. Considering disability.   Dx with HTN 2018 and started on ACE-HCTZ.  Out of his house, in an appartment now.   Has been on lyrica for pain without relief. Has been having pain in R shoulder "grinding".  Does want more surgery on his back.   He had penile abscess recently that was drained.  Would like cialis refill.   HIV 1 RNA Quant (Copies/mL)  Date Value  04/26/2022 <20 (H)  03/22/2022 Not Detected  09/24/2020 Not Detected   CD4 T Cell Abs (/uL)  Date Value  03/22/2022 939  09/24/2020 712  08/13/2019 998     Health Maintenance  Topic Date Due   DTaP/Tdap/Td (1 - Tdap) Never done   Zoster Vaccines- Shingrix (1 of 2) Never done   COLONOSCOPY (Pts 45-23yrs Insurance coverage will need to be confirmed)  Never done   Lung Cancer Screening  Never done   COVID-19 Vaccine (5 - 2023-24 season) 03/04/2022   INFLUENZA VACCINE  02/02/2023   Hepatitis C Screening  Completed   HIV Screening  Completed   HPV VACCINES  Aged Out      Review of Systems  Constitutional:  Negative for chills, fever and weight loss.  Musculoskeletal:  Positive for joint pain, myalgias and neck pain.    Please see HPI. All other systems reviewed and negative.     Objective:  Physical Exam Constitutional:      Appearance: Normal appearance.  HENT:     Mouth/Throat:     Mouth: Mucous membranes are moist.     Pharynx: No oropharyngeal exudate.  Eyes:     Extraocular Movements: Extraocular movements intact.     Pupils: Pupils are equal, round, and reactive to light.   Cardiovascular:     Rate and Rhythm: Normal rate and regular rhythm.  Pulmonary:     Effort: Pulmonary effort is normal.     Breath sounds: Normal breath sounds.  Abdominal:     General: Bowel sounds are normal. There is no distension.     Palpations: Abdomen is soft.     Tenderness: There is no abdominal tenderness.  Musculoskeletal:     Cervical back: Normal range of motion and neck supple.     Right lower leg: No edema.     Left lower leg: No edema.  Neurological:     General: No focal deficit present.     Mental Status: He is alert.            Assessment & Plan:

## 2022-11-01 NOTE — Assessment & Plan Note (Signed)
Will send to the lab.  Offered/refused condoms.  Doing well with dovato. Discussed cabaneuva. Does not like shots.  Rtc in 9 months.

## 2022-11-01 NOTE — Assessment & Plan Note (Signed)
Has seen Dr Maisie Fus before.  He does not want to go back currently Denies further lesions.

## 2022-11-01 NOTE — Assessment & Plan Note (Addendum)
Doing well, asx and well controlled.  Occas sx of orthostasis. Encourage hydration.

## 2022-11-01 NOTE — Assessment & Plan Note (Signed)
Resolved

## 2022-11-01 NOTE — Assessment & Plan Note (Signed)
Encouraged to quit. 

## 2022-11-02 ENCOUNTER — Encounter: Payer: Self-pay | Admitting: Infectious Diseases

## 2022-11-02 LAB — CBC
Hemoglobin: 17.9 g/dL — ABNORMAL HIGH (ref 13.0–17.7)
MCH: 37.3 pg — ABNORMAL HIGH (ref 26.6–33.0)
MCHC: 34.8 g/dL (ref 31.5–35.7)
RBC: 4.8 x10E6/uL (ref 4.14–5.80)

## 2022-11-02 LAB — COMPREHENSIVE METABOLIC PANEL
ALT: 14 IU/L (ref 0–44)
Albumin/Globulin Ratio: 1.9 (ref 1.2–2.2)
BUN: 13 mg/dL (ref 6–24)
Calcium: 9.8 mg/dL (ref 8.7–10.2)
Potassium: 4.4 mmol/L (ref 3.5–5.2)
Total Protein: 6.3 g/dL (ref 6.0–8.5)

## 2022-11-02 LAB — T-HELPER CELLS (CD4) COUNT (NOT AT ARMC)
CD4 % Helper T Cell: 39 % (ref 33–65)
CD4 T Cell Abs: 901 /uL (ref 400–1790)

## 2022-11-02 LAB — HIV-1 RNA QUANT-NO REFLEX-BLD

## 2022-11-03 ENCOUNTER — Other Ambulatory Visit: Payer: Self-pay | Admitting: Infectious Diseases

## 2022-11-03 DIAGNOSIS — B20 Human immunodeficiency virus [HIV] disease: Secondary | ICD-10-CM

## 2022-11-03 LAB — COMPREHENSIVE METABOLIC PANEL
AST: 21 IU/L (ref 0–40)
Albumin: 4.1 g/dL (ref 3.8–4.9)
Alkaline Phosphatase: 84 IU/L (ref 44–121)
BUN/Creatinine Ratio: 14 (ref 9–20)
Bilirubin Total: 0.4 mg/dL (ref 0.0–1.2)
CO2: 23 mmol/L (ref 20–29)
Chloride: 98 mmol/L (ref 96–106)
Creatinine, Ser: 0.93 mg/dL (ref 0.76–1.27)
Globulin, Total: 2.2 g/dL (ref 1.5–4.5)
Glucose: 93 mg/dL (ref 70–99)
Sodium: 138 mmol/L (ref 134–144)
eGFR: 98 mL/min/{1.73_m2} (ref >59)

## 2022-11-03 LAB — CBC
Hematocrit: 51.4 % — ABNORMAL HIGH (ref 37.5–51.0)
MCV: 107 fL — ABNORMAL HIGH (ref 79–97)
Platelets: 300 10*3/uL (ref 150–450)
RDW: 12.2 % (ref 11.6–15.4)
WBC: 12.1 10*3/uL — ABNORMAL HIGH (ref 3.4–10.8)

## 2022-11-03 LAB — LIPID PANEL
Chol/HDL Ratio: 4.8 ratio (ref 0.0–5.0)
Cholesterol, Total: 200 mg/dL — ABNORMAL HIGH (ref 100–199)
HDL: 42 mg/dL (ref >39)
LDL Chol Calc (NIH): 127 mg/dL — ABNORMAL HIGH (ref 0–99)
Triglycerides: 171 mg/dL — ABNORMAL HIGH (ref 0–149)
VLDL Cholesterol Cal: 31 mg/dL (ref 5–40)

## 2022-11-03 LAB — HIV-1 RNA QUANT-NO REFLEX-BLD: HIV-1 RNA Viral Load: 2010 copies/mL

## 2022-11-03 LAB — RPR: RPR Ser Ql: NONREACTIVE

## 2022-11-03 NOTE — Addendum Note (Signed)
Addended by: Bufford Spikes on: 11/03/2022 02:12 PM   Modules accepted: Orders

## 2022-11-07 ENCOUNTER — Other Ambulatory Visit: Payer: Self-pay | Admitting: Infectious Diseases

## 2022-11-07 DIAGNOSIS — G8929 Other chronic pain: Secondary | ICD-10-CM

## 2022-11-12 LAB — GENOSURE PRIME (GSPRIL)

## 2022-11-12 LAB — SPECIMEN STATUS REPORT

## 2022-11-17 ENCOUNTER — Encounter: Payer: Self-pay | Admitting: Infectious Diseases

## 2022-12-14 ENCOUNTER — Telehealth: Payer: Self-pay | Admitting: *Deleted

## 2022-12-14 NOTE — Telephone Encounter (Signed)
Message from patient that he is having problems with his eyes.  Redness and using drops for a few days with no relief.  Dr. Sol Blazing reviewed attachment photo and wants patient to come in for an appointment.  Attempts to call patient to schedule an appointment to assess his eye problem.  Unable to reach x 2 per phone previously today by front staff.  MyChart message sent to patient requesting that he contact the Clinics to set up an appointment.

## 2022-12-15 ENCOUNTER — Ambulatory Visit (INDEPENDENT_AMBULATORY_CARE_PROVIDER_SITE_OTHER): Payer: Commercial Managed Care - HMO | Admitting: Student

## 2022-12-15 ENCOUNTER — Encounter: Payer: Self-pay | Admitting: Student

## 2022-12-15 ENCOUNTER — Other Ambulatory Visit: Payer: Self-pay

## 2022-12-15 ENCOUNTER — Encounter: Payer: Commercial Managed Care - HMO | Admitting: Internal Medicine

## 2022-12-15 VITALS — BP 102/66 | HR 106 | Temp 98.3°F | Ht 67.0 in | Wt 149.3 lb

## 2022-12-15 DIAGNOSIS — H5789 Other specified disorders of eye and adnexa: Secondary | ICD-10-CM

## 2022-12-15 MED ORDER — FLUTICASONE PROPIONATE 50 MCG/ACT NA SUSP: 1.0000 | Freq: Every day | NASAL | 2 refills | Status: AC

## 2022-12-15 NOTE — Patient Instructions (Addendum)
Thank you so much for coming to the clinic today!   I believe your eye inflammation is likely secondary to your allergies. I will go ahead and refill the flonase, and there are other over the counter medications you can use as well. One of them is Claritin, you can take one in the morning, and another are eye drops called Zaditor. Please come back if the symptoms do not get better.   If you have any questions please feel free to the call the clinic at anytime at [phone]. It was a pleasure seeing you!  Best, Dr. Thomasene Ripple

## 2022-12-16 DIAGNOSIS — H5789 Other specified disorders of eye and adnexa: Secondary | ICD-10-CM | POA: Insufficient documentation

## 2022-12-16 NOTE — Progress Notes (Signed)
CC: R Eye swelling  HPI:  [name] is a 52 y.o. male living with a history stated below and presents today for R Eye swelling. Please see problem based assessment and plan for additional details.  Past Medical History:  Diagnosis Date   Anxiety    Depression    Family history of adverse reaction to anesthesia    Mother had vomiting   HIV infection (HCC)    Hypertension     Current Outpatient Medications on File Prior to Visit  Medication Sig Dispense Refill   Crofelemer (MYTESI) 125 MG TBEC TAKE 1 TABLET BY MOUTH TWICE DAILY( 10AM AND 5PM) (Patient not taking: Reported on 08/10/2022) 60 tablet 11   DENTA 5000 PLUS 1.1 % CREA dental cream Take by mouth.     dolutegravir-lamiVUDine (DOVATO) 50-300 MG tablet Take 1 tablet by mouth daily. 30 tablet 11   DULoxetine (CYMBALTA) 60 MG capsule TAKE 2 CAPSULES(120 MG) BY MOUTH DAILY 60 capsule 5   ibuprofen (ADVIL) 800 MG tablet Take 1 tablet (800 mg total) by mouth every 8 (eight) hours as needed. 30 tablet 0   nicotine (NICODERM CQ - DOSED IN MG/24 HOURS) 14 mg/24hr patch Place 1 patch (14 mg total) onto the skin daily. (Patient not taking: Reported on 04/20/2021) 7 patch 0   tadalafil (CIALIS) 10 MG tablet Take 1 tablet (10 mg total) by mouth daily as needed for erectile dysfunction. 30 tablet 5   valACYclovir (VALTREX) 1000 MG tablet TAKE 1 TABLET(1000 MG) BY MOUTH TWICE DAILY 60 tablet 11   valsartan-hydrochlorothiazide (DIOVAN-HCT) 80-12.5 MG tablet TAKE 1 TABLET BY MOUTH DAILY 30 tablet 11   No current facility-administered medications on file prior to visit.    Family History  Problem Relation Age of Onset   Diabetes Father     Social History   Socioeconomic History   Marital status: Single    Spouse name: Not on file   Number of children: Not on file   Years of education: Not on file   Highest education level: Not on file  Occupational History   Not on file  Tobacco Use   Smoking status: Every Day     Packs/day: 1.00    Years: 25.00    Additional pack years: 0.00    Total pack years: 25.00    Types: Cigarettes   Smokeless tobacco: Never   Tobacco comments:    Needs patches if possible   Vaping Use   Vaping Use: Never used  Substance and Sexual Activity   Alcohol use: Yes    Alcohol/week: 5.0 standard drinks of alcohol    Types: 5 Shots of liquor per week    Comment: vodka and wine daily   Drug use: Yes    Frequency: 4.0 times per week    Types: Marijuana    Comment: daily   Sexual activity: Yes    Partners: Male    Comment: given condoms  Other Topics Concern   Not on file  Social History Narrative   Not on file   Social Determinants of Health   Financial Resource Strain: Not on file  Food Insecurity: Not on file  Transportation Needs: Not on file  Physical Activity: Not on file  Stress: Not on file  Social Connections: Not on file  Intimate Partner Violence: Not on file    Review of Systems: ROS negative except for what is noted on the assessment and plan.  Vitals:   12/15/22 0936  BP: 102/66  Pulse: (!) 106  Temp: 98.3 F (36.8 C)  TempSrc: Oral  SpO2: 97%  Weight: 149 lb 4.8 oz (67.7 kg)  Height: 5\' 7"  (1.702 m)    Physical Exam: Constitutional: well-appearing male  in no acute distress HENT: normocephalic atraumatic, mucous membranes moist Eyes: Swelling of R upper eyelid, R mild erythematous conjunctiva, no drainage or discharge appreciated. Visual acuity in tact.  Neck: supple, no palpable lymph nodes Cardiovascular: regular rate and rhythm, no m/r/g Pulmonary/Chest: normal work of breathing on room air, lungs clear to auscultation bilaterally Abdominal: soft, non-tender, non-distended    Assessment & Plan:   Eye swelling, right Pt presents with a six day history of right eye swelling. Pt states last Friday he started to notice his eye becoming tender, and the next day woke up and it was swollen and crusted over. He states it's scratchy, but  denies any pain. He states there has been white discharge intermittently. He denies any recent bug bites, travel history. He states that he has a ongoing sinus issus as well. His vision has not been affected, and he denies any recent fevers, chills, shortness of breath.   On my physical exam, there is upper eyelid swelling, there is mild conjunctivitis present, visual acuity in tact.   Differentials currently include blepharitis, or allergic conjunctivitis. Regardless, will treat conservatively.   Plan:  - Recommended patient apply warm compresses to area  - Sent in flonase to treat underlying allergy  - Recommended patient pick up Zaditor eye drops   Patient discussed with Dr. Lennon Alstrom Emmajane Altamura, M.D. [facility] Internal Medicine, PGY-1 Pager: [phone] Date 12/16/2022 Time 3:01 PM

## 2022-12-16 NOTE — Assessment & Plan Note (Signed)
Pt presents with a six day history of right eye swelling. Pt states last Friday he started to notice his eye becoming tender, and the next day woke up and it was swollen and crusted over. He states it's scratchy, but denies any pain. He states there has been white discharge intermittently. He denies any recent bug bites, travel history. He states that he has a ongoing sinus issus as well. His vision has not been affected, and he denies any recent fevers, chills, shortness of breath.   On my physical exam, there is upper eyelid swelling, there is mild conjunctivitis present, visual acuity in tact.   Differentials currently include blepharitis, or allergic conjunctivitis. Regardless, will treat conservatively.   Plan:  - Recommended patient apply warm compresses to area  - Sent in flonase to treat underlying allergy  - Recommended patient pick up Zaditor eye drops

## 2022-12-20 ENCOUNTER — Other Ambulatory Visit: Payer: Commercial Managed Care - HMO

## 2022-12-20 NOTE — Progress Notes (Signed)
Internal Medicine Clinic Attending  Case discussed with Dr. Thomasene Ripple  At the time of the visit.  We reviewed the resident's history and exam and pertinent patient test results.  I agree with the assessment, diagnosis, and plan of care documented in the resident's note. Symptoms already improving per Dr. Nooruddin's history. Return if not continuing to improve or worsening with documented measures.

## 2022-12-26 NOTE — Progress Notes (Deleted)
Eye swelling R  HTN  Anxietydepression  Basal cell carcinoma  HCM Colonoscopy CT lung cancer

## 2023-01-18 ENCOUNTER — Encounter: Payer: Commercial Managed Care - HMO | Admitting: Infectious Diseases

## 2023-01-24 ENCOUNTER — Other Ambulatory Visit: Payer: Self-pay

## 2023-01-24 ENCOUNTER — Other Ambulatory Visit: Payer: Self-pay | Admitting: Infectious Diseases

## 2023-01-24 DIAGNOSIS — I158 Other secondary hypertension: Secondary | ICD-10-CM

## 2023-01-24 MED ORDER — VALSARTAN-HYDROCHLOROTHIAZIDE 80-12.5 MG PO TABS
1.0000 | ORAL_TABLET | Freq: Every day | ORAL | 0 refills | Status: DC
Start: 2023-01-24 — End: 2023-02-14

## 2023-01-24 MED ORDER — VALSARTAN-HYDROCHLOROTHIAZIDE 80-12.5 MG PO TABS: 1.0000 | ORAL_TABLET | Freq: Every day | ORAL | 0 refills | Status: DC

## 2023-02-02 ENCOUNTER — Telehealth: Payer: Self-pay

## 2023-02-02 ENCOUNTER — Encounter: Payer: Self-pay | Admitting: Infectious Diseases

## 2023-02-02 NOTE — Telephone Encounter (Signed)
Patient had a dental appointment, stopping at check out to have message be sent to [facility] about concern of weight gain after starting new medication. Best contact number is [phone]

## 2023-02-07 ENCOUNTER — Other Ambulatory Visit: Payer: Self-pay | Admitting: Infectious Diseases

## 2023-02-07 DIAGNOSIS — B20 Human immunodeficiency virus [HIV] disease: Secondary | ICD-10-CM

## 2023-02-07 MED ORDER — EMTRICITAB-RILPIVIR-TENOFOV AF 200-25-25 MG PO TABS
1.0000 | ORAL_TABLET | Freq: Every day | ORAL | 3 refills | Status: DC
Start: 2023-02-07 — End: 2024-03-02

## 2023-02-14 ENCOUNTER — Other Ambulatory Visit: Payer: Self-pay | Admitting: Infectious Diseases

## 2023-02-14 DIAGNOSIS — B029 Zoster without complications: Secondary | ICD-10-CM

## 2023-02-14 DIAGNOSIS — I158 Other secondary hypertension: Secondary | ICD-10-CM

## 2023-02-14 MED ORDER — VALACYCLOVIR HCL 1 G PO TABS: ORAL_TABLET | ORAL | 11 refills | Status: DC

## 2023-02-14 MED ORDER — VALSARTAN-HYDROCHLOROTHIAZIDE 80-12.5 MG PO TABS
1.0000 | ORAL_TABLET | Freq: Every day | ORAL | 3 refills | Status: DC
Start: 2023-02-14 — End: 2023-02-14

## 2023-02-14 MED ORDER — VALSARTAN-HYDROCHLOROTHIAZIDE 80-12.5 MG PO TABS: 1.0000 | ORAL_TABLET | Freq: Every day | ORAL | 3 refills | Status: DC

## 2023-03-20 ENCOUNTER — Telehealth: Payer: Self-pay | Admitting: *Deleted

## 2023-03-20 DIAGNOSIS — U071 COVID-19: Secondary | ICD-10-CM

## 2023-03-20 NOTE — Telephone Encounter (Signed)
Call from patient tested positive for Covid this morning. Started feeling sick on Saturday.  Fever but did not take.  Diarrhea x 2 days.  Sweating, head congestion and aching.  Has taken Ibuprofen .  Has not been eating much.  Was encouraged to drink fluids.  Wants to know what else he can do.  Uses the [facility] for his HIV meds as he has no insurance. If meds ordered might be able to do [facility] program.

## 2023-03-20 NOTE — Telephone Encounter (Signed)
Pt is calling again.

## 2023-03-21 ENCOUNTER — Other Ambulatory Visit (HOSPITAL_COMMUNITY): Payer: Self-pay

## 2023-03-21 ENCOUNTER — Other Ambulatory Visit: Payer: Self-pay

## 2023-03-21 MED ORDER — NIRMATRELVIR/RITONAVIR (PAXLOVID)TABLET
3.0000 | ORAL_TABLET | Freq: Two times a day (BID) | ORAL | 0 refills | Status: AC
Start: 2023-03-21 — End: 2023-03-26
  Filled 2023-03-21: qty 30, 5d supply, fill #0

## 2023-03-21 NOTE — Telephone Encounter (Signed)
Returned Mr [facility] call.  Began to feel ill on Saturday, n/v, fatigue.  Has been taking OTC's.  His insurance was canceled.  Will send in paxlovid rx to [facility] pharmacy.

## 2023-03-30 ENCOUNTER — Other Ambulatory Visit: Payer: Self-pay

## 2023-05-30 ENCOUNTER — Encounter: Payer: Self-pay | Admitting: *Deleted

## 2023-06-19 ENCOUNTER — Other Ambulatory Visit: Payer: Self-pay | Admitting: Infectious Diseases

## 2023-06-19 DIAGNOSIS — N529 Male erectile dysfunction, unspecified: Secondary | ICD-10-CM

## 2023-06-19 DIAGNOSIS — B20 Human immunodeficiency virus [HIV] disease: Secondary | ICD-10-CM

## 2023-06-19 MED ORDER — TADALAFIL 10 MG PO TABS
10.0000 mg | ORAL_TABLET | Freq: Every day | ORAL | 0 refills | Status: DC | PRN
Start: 2023-06-19 — End: 2024-03-25

## 2023-06-19 NOTE — Telephone Encounter (Signed)
tadalafil (CIALIS) 10 MG tablet   [facility] - [place], [place] - [address]

## 2023-08-01 ENCOUNTER — Other Ambulatory Visit (HOSPITAL_COMMUNITY)
Admission: RE | Admit: 2023-08-01 | Discharge: 2023-08-01 | Disposition: A | Discharge status: Home / Self Care | Payer: Medicaid Other | Source: Ambulatory Visit | Attending: Infectious Diseases | Admitting: Infectious Diseases

## 2023-08-01 ENCOUNTER — Other Ambulatory Visit: Payer: Self-pay

## 2023-08-01 ENCOUNTER — Ambulatory Visit: Payer: Medicaid Other | Admitting: Infectious Diseases

## 2023-08-01 ENCOUNTER — Encounter: Payer: Self-pay | Admitting: Infectious Diseases

## 2023-08-01 VITALS — BP 108/67 | HR 87 | Temp 98.6°F | Ht 67.0 in | Wt 153.2 lb

## 2023-08-01 DIAGNOSIS — A630 Anogenital (venereal) warts: Secondary | ICD-10-CM | POA: Diagnosis present

## 2023-08-01 DIAGNOSIS — Z79899 Other long term (current) drug therapy: Secondary | ICD-10-CM

## 2023-08-01 DIAGNOSIS — B20 Human immunodeficiency virus [HIV] disease: Secondary | ICD-10-CM | POA: Diagnosis not present

## 2023-08-01 DIAGNOSIS — N522 Drug-induced erectile dysfunction: Secondary | ICD-10-CM | POA: Diagnosis not present

## 2023-08-01 DIAGNOSIS — Z113 Encounter for screening for infections with a predominantly sexual mode of transmission: Secondary | ICD-10-CM | POA: Insufficient documentation

## 2023-08-01 DIAGNOSIS — F17210 Nicotine dependence, cigarettes, uncomplicated: Secondary | ICD-10-CM | POA: Diagnosis not present

## 2023-08-01 DIAGNOSIS — F17200 Nicotine dependence, unspecified, uncomplicated: Secondary | ICD-10-CM

## 2023-08-01 NOTE — Assessment & Plan Note (Addendum)
He is doing well Continue dovato Will check his labs today Vax- refuses flu and covid Has condoms Rtc in 9 months

## 2023-08-01 NOTE — Progress Notes (Signed)
   Subjective:    Patient ID: Marvin Macias, male  DOB: [birth year], 53 y.o.        MRN: [id number]   HPI 53 yo M with HIV+, rectal warts, anxiety d/o. R radial fracture with ORIF October 2011. No issues with  On atripla --> complera --> odefsy (07-2015) --> dovato. 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 No dysplasia.  Had cervical fusion (C6-7) 2021. (Dr Ophelia Charter). Still has some pain in his neck, numbness in his L hand. Considering disability.    Dx with HTN 2018 and started on ACE-HCTZ.   Out of his house, in an appartment now.    Has been on lyrica for pain without relief. Has been having pain in R shoulder "grinding".  Does want more surgery on his back.    Is on medicaid now. Is working at [employer].   HIV 1 RNA Quant (Copies/mL)  Date Value  04/26/2022 <20 (H)  03/22/2022 Not Detected  09/24/2020 Not Detected   HIV-1 RNA Viral Load (copies/mL)  Date Value  11/01/2022 2,010   CD4 T Cell Abs (/uL)  Date Value  11/01/2022 901  03/22/2022 939  09/24/2020 712     Health Maintenance  Topic Date Due  . DTaP/Tdap/Td (1 - Tdap) Never done  . Zoster Vaccines- Shingrix (1 of 2) Never done  . Colonoscopy  Never done  . Lung Cancer Screening  Never done  . INFLUENZA VACCINE  02/02/2023  . COVID-19 Vaccine (5 - 2024-25 season) 03/05/2023  . Pneumococcal Vaccine 8-8 Years old (4 of 4 - PPSV23 or PCV20) 08/06/2033  . Hepatitis C Screening  Completed  . HIV Screening  Completed  . HPV VACCINES  Aged Out   Did not get flu, covid last fall.    Review of Systems  Constitutional:  Negative for chills, fever and weight loss.  Respiratory:  Negative for cough and shortness of breath.   Cardiovascular:  Negative for chest pain.  Gastrointestinal:  Negative for blood in stool and constipation.  Genitourinary:  Negative for dysuria.  Musculoskeletal:  Positive for back pain and joint pain.   Please see HPI. All other systems reviewed and negative.     Objective:   Physical Exam Vitals reviewed.  Constitutional:      Appearance: Normal appearance.  HENT:     Mouth/Throat:     Mouth: Mucous membranes are moist.     Pharynx: No oropharyngeal exudate.  Eyes:     Extraocular Movements: Extraocular movements intact.     Pupils: Pupils are equal, round, and reactive to light.  Cardiovascular:     Rate and Rhythm: Normal rate and regular rhythm.  Pulmonary:     Effort: Pulmonary effort is normal.     Breath sounds: Normal breath sounds.  Abdominal:     General: Bowel sounds are normal. There is no distension.     Palpations: Abdomen is soft.     Tenderness: There is no abdominal tenderness.  Musculoskeletal:        General: Normal range of motion.     Cervical back: Normal range of motion and neck supple.  Neurological:     General: No focal deficit present.     Mental Status: He is alert.  Psychiatric:        Mood and Affect: Mood normal.          Assessment & Plan:

## 2023-08-01 NOTE — Assessment & Plan Note (Signed)
Has been asx No further lesions.  Defers anoscopy.

## 2023-08-01 NOTE — Assessment & Plan Note (Signed)
Has cialis, refills.

## 2023-08-01 NOTE — Assessment & Plan Note (Signed)
1 ppd since 53 yo.  We discussed low dose CT.  Will think about.Marland KitchenMarland KitchenMarland Kitchen

## 2023-08-02 LAB — COMPREHENSIVE METABOLIC PANEL
ALT: 16 [IU]/L (ref 0–44)
AST: 15 [IU]/L (ref 0–40)
Albumin: 4.4 g/dL (ref 3.8–4.9)
Alkaline Phosphatase: 71 [IU]/L (ref 44–121)
BUN/Creatinine Ratio: 16 (ref 9–20)
BUN: 17 mg/dL (ref 6–24)
Bilirubin Total: 0.3 mg/dL (ref 0.0–1.2)
CO2: 21 mmol/L (ref 20–29)
Calcium: 9.5 mg/dL (ref 8.7–10.2)
Chloride: 102 mmol/L (ref 96–106)
Creatinine, Ser: 1.06 mg/dL (ref 0.76–1.27)
Globulin, Total: 2 g/dL (ref 1.5–4.5)
Glucose: 82 mg/dL (ref 70–99)
Potassium: 4 mmol/L (ref 3.5–5.2)
Sodium: 136 mmol/L (ref 134–144)
Total Protein: 6.4 g/dL (ref 6.0–8.5)
eGFR: 83 mL/min/{1.73_m2} (ref >59)

## 2023-08-02 LAB — CBC WITH DIFFERENTIAL/PLATELET
Basophils Absolute: 0.1 10*3/uL (ref 0.0–0.2)
Basos: 1 %
EOS (ABSOLUTE): 0.1 10*3/uL (ref 0.0–0.4)
Eos: 1 %
Hematocrit: 49.5 % (ref 37.5–51.0)
Hemoglobin: 16.8 g/dL (ref 13.0–17.7)
Immature Grans (Abs): 0.1 10*3/uL (ref 0.0–0.1)
Immature Granulocytes: 1 %
Lymphocytes Absolute: 2.9 10*3/uL (ref 0.7–3.1)
Lymphs: 26 %
MCH: 34.8 pg — ABNORMAL HIGH (ref 26.6–33.0)
MCHC: 33.9 g/dL (ref 31.5–35.7)
MCV: 103 fL — ABNORMAL HIGH (ref 79–97)
Monocytes Absolute: 0.7 10*3/uL (ref 0.1–0.9)
Monocytes: 6 %
Neutrophils Absolute: 7.3 10*3/uL — ABNORMAL HIGH (ref 1.4–7.0)
Neutrophils: 65 %
Platelets: 289 10*3/uL (ref 150–450)
RBC: 4.83 x10E6/uL (ref 4.14–5.80)
RDW: 12.9 % (ref 11.6–15.4)
WBC: 11 10*3/uL — ABNORMAL HIGH (ref 3.4–10.8)

## 2023-08-02 LAB — HIV-1 RNA QUANT-NO REFLEX-BLD
HIV-1 RNA Viral Load Log: 2.778 {Log}
HIV-1 RNA Viral Load: 600 {copies}/mL

## 2023-08-02 LAB — T-HELPER CELLS (CD4) COUNT (NOT AT ARMC)
CD4 % Helper T Cell: 42 % (ref 33–65)
CD4 T Cell Abs: 1133 /uL (ref 400–1790)

## 2023-08-02 LAB — RPR: RPR Ser Ql: NONREACTIVE

## 2023-08-02 LAB — LIPID PANEL
Chol/HDL Ratio: 5.2 {ratio} — ABNORMAL HIGH (ref 0.0–5.0)
Cholesterol, Total: 202 mg/dL — ABNORMAL HIGH (ref 100–199)
HDL: 39 mg/dL — ABNORMAL LOW (ref >39)
LDL Chol Calc (NIH): 135 mg/dL — ABNORMAL HIGH (ref 0–99)
Triglycerides: 158 mg/dL — ABNORMAL HIGH (ref 0–149)
VLDL Cholesterol Cal: 28 mg/dL (ref 5–40)

## 2023-08-02 LAB — URINE CYTOLOGY ANCILLARY ONLY
Chlamydia: NEGATIVE
Comment: NEGATIVE
Comment: NORMAL
Neisseria Gonorrhea: NEGATIVE

## 2023-08-03 ENCOUNTER — Other Ambulatory Visit: Payer: Self-pay | Admitting: Infectious Diseases

## 2023-08-03 DIAGNOSIS — B20 Human immunodeficiency virus [HIV] disease: Secondary | ICD-10-CM

## 2023-08-03 NOTE — Addendum Note (Signed)
Addended by: Bufford Spikes on: 08/03/2023 11:19 AM   Modules accepted: Orders

## 2023-08-05 LAB — GENOSURE PRIME (GSPRIL)

## 2023-08-07 LAB — GENOSURE PRIME (GSPRIL)

## 2023-08-07 LAB — SPECIMEN STATUS REPORT

## 2023-12-11 ENCOUNTER — Encounter: Payer: Self-pay | Admitting: Infectious Diseases

## 2024-02-15 ENCOUNTER — Telehealth: Payer: Self-pay | Admitting: Infectious Diseases

## 2024-02-15 DIAGNOSIS — B029 Zoster without complications: Secondary | ICD-10-CM

## 2024-02-15 MED ORDER — VALACYCLOVIR HCL 1 G PO TABS
ORAL_TABLET | ORAL | 11 refills | Status: AC
Start: 2024-02-15 — End: ?

## 2024-02-15 NOTE — Telephone Encounter (Signed)
 Valacyclovir  refill sent to pharmacy below  [id number], [place] - [address] AT [facility] OF [facility] & [address] [place] [address] Phone: [phone] Fax: [phone]

## 2024-02-15 NOTE — Telephone Encounter (Signed)
 Copied from CRM [id number]. Topic: Clinical - Medication Refill >> Feb 15, 2024  8:16 AM Zane F wrote: Caller: Seda  Calling From: [facility]  Concern: The patient came into the pharmacy and was irate about needing the listed prescription. The pharmacist is scared due to his behavior and she would like to assist the patient with getting a refill due to the prescription being expired. Please refill as soon as possible due to escalating concern. Please contact the patient and inform him of the 3 business day waiting period.    Medication: valACYclovir  (VALTREX ) 1000 MG tablet   Has the patient contacted their pharmacy? Yes (Agent: If no, request that the patient contact the pharmacy for the refill. If patient does not wish to contact the pharmacy document the reason why and proceed with request.) (Agent: If yes, when and what did the pharmacy advise?)  This is the patient's preferred pharmacy:    [id number], [place] - [address] AT [facility] OF [facility] & [address] [facility] [place] [address] Phone: [phone] Fax: [phone]   Is this the correct pharmacy for this prescription? Yes   Has the prescription been filled recently? No  Is the patient out of the medication? Yes  Has the patient been seen for an appointment in the last year OR does the patient have an upcoming appointment? yes  Can we respond through MyChart? Yes  Agent: Please be advised that Rx refills may take up to 3 business days. We ask that you follow-up with your pharmacy.

## 2024-02-26 ENCOUNTER — Other Ambulatory Visit: Payer: Self-pay | Admitting: *Deleted

## 2024-02-26 DIAGNOSIS — I158 Other secondary hypertension: Secondary | ICD-10-CM

## 2024-02-27 MED ORDER — VALSARTAN-HYDROCHLOROTHIAZIDE 80-12.5 MG PO TABS: 1.0000 | ORAL_TABLET | Freq: Every day | ORAL | 3 refills | Status: DC

## 2024-03-02 ENCOUNTER — Telehealth: Payer: Self-pay | Admitting: Infectious Disease

## 2024-03-02 ENCOUNTER — Other Ambulatory Visit: Payer: Self-pay | Admitting: Infectious Disease

## 2024-03-02 DIAGNOSIS — B20 Human immunodeficiency virus [HIV] disease: Secondary | ICD-10-CM

## 2024-03-02 MED ORDER — EMTRICITAB-RILPIVIR-TENOFOV AF 200-25-25 MG PO TABS: 1.0000 | ORAL_TABLET | Freq: Every day | ORAL | 11 refills | Status: DC

## 2024-03-02 NOTE — Telephone Encounter (Signed)
 Pt had me paged

## 2024-03-02 NOTE — Progress Notes (Signed)
 Patient called and was running out of Odefsey  had sent messages about this  I sent script to his pharmacy with 11 refills  He did not know Dr. Eben is now with IM  He would like to talk with Chyrl re smoking cessation and should make an appt in the new space at [facility]

## 2024-03-05 ENCOUNTER — Other Ambulatory Visit: Payer: Self-pay | Admitting: Infectious Diseases

## 2024-03-05 ENCOUNTER — Telehealth: Payer: Self-pay | Admitting: Infectious Diseases

## 2024-03-05 ENCOUNTER — Telehealth: Payer: Self-pay | Admitting: *Deleted

## 2024-03-05 DIAGNOSIS — F17200 Nicotine dependence, unspecified, uncomplicated: Secondary | ICD-10-CM

## 2024-03-05 DIAGNOSIS — B20 Human immunodeficiency virus [HIV] disease: Secondary | ICD-10-CM

## 2024-03-05 MED ORDER — EMTRICITAB-RILPIVIR-TENOFOV AF 200-25-25 MG PO TABS: 1.0000 | ORAL_TABLET | Freq: Every day | ORAL | 11 refills | Status: DC

## 2024-03-05 MED ORDER — EMTRICITAB-RILPIVIR-TENOFOV AF 200-25-25 MG PO TABS: 1.0000 | ORAL_TABLET | Freq: Every day | ORAL | 4 refills | Status: AC

## 2024-03-05 MED ORDER — NICOTINE 14 MG/24HR TD PT24: 14.0000 mg | MEDICATED_PATCH | Freq: Every day | TRANSDERMAL | 0 refills | Status: AC

## 2024-03-05 MED ORDER — NICOTINE 14 MG/24HR TD PT24: 14.0000 mg | MEDICATED_PATCH | Freq: Every day | TRANSDERMAL | 0 refills | Status: DC

## 2024-03-05 NOTE — Telephone Encounter (Signed)
 Copied from CRM [id number]. Topic: Clinical - Medical Advice >> Mar 05, 2024  8:42 AM Diannia H wrote: Reason for CRM: Patient has not had his emtricitabine -rilpivir-tenofovir  AF (ODEFSEY ) 200-25-25 MG TABS tablet for about 4 days and needs to know if he needs to come in for blood work. Could you please assist? Callback number is [phone]. He also wants to talk to the provider because he is ready to stop smoking.

## 2024-03-05 NOTE — Telephone Encounter (Signed)
 Called pt and left VM that I have refilled his rx's at the [facility] on [address] and that I'd be glad to talk to him anytime.

## 2024-03-05 NOTE — Telephone Encounter (Signed)
 Marvin Macias called requesting refills of Odefsey . Notified him that Dr. Fleeta Rothman sent them in on 8/30. Provided him with phone number to Dr. Loa office and clarified that Dr. Eben is still with Internal Medicine, but that the IM office has just moved to [facility].   Marvin Macias, BSN, RN

## 2024-03-07 ENCOUNTER — Telehealth: Payer: Self-pay

## 2024-03-07 NOTE — Telephone Encounter (Signed)
 Received voicemail from patient stating he had trouble getting his nicotine  patches today.   Called him back, no answer. Left HIPAA compliant voicemail stating he would need to call Dr. Loa office and provided him with the number.   Genna Casimir, BSN, RN

## 2024-03-13 ENCOUNTER — Ambulatory Visit

## 2024-03-13 ENCOUNTER — Other Ambulatory Visit: Payer: Self-pay

## 2024-03-13 VITALS — BP 106/76 | HR 83 | Temp 98.0°F | Ht 67.0 in | Wt 155.6 lb

## 2024-03-13 DIAGNOSIS — R21 Rash and other nonspecific skin eruption: Secondary | ICD-10-CM

## 2024-03-13 DIAGNOSIS — H5789 Other specified disorders of eye and adnexa: Secondary | ICD-10-CM | POA: Diagnosis present

## 2024-03-13 MED ORDER — HYDROCORTISONE 1 % EX CREA
TOPICAL_CREAM | Freq: Two times a day (BID) | CUTANEOUS | 1 refills | Status: AC
Start: 2024-03-13 — End: ?

## 2024-03-13 NOTE — Progress Notes (Signed)
 Established Patient Office Visit  Subjective   Patient ID: Marvin Macias, male    DOB: 1970/09/09  Age: 53 y.o. MRN: [id number]  Patient here for an acute red eye.  Symptoms started on Saturday.  The eye initially became red and swollen.  Feels like something is inside his eye, can feel it when he moves his eye.  He denies any trauma.  Denies any vision loss but does see a floater.  He was working out in the yard on Thursday and thinks he may have had something get in his eye then.  Denies any sick contacts, denies being around anyone with something similar, denies recent infection.  He has had a stye in the past on that eye, he has been applying warm compresses with little relief.  He took off ibuprofen  last night because he woke up with pain, it helped somewhat.  The mucus is worse when he wakes up in the morning and is better throughout the day.        Objective:     BP 106/76 (BP Location: Right Arm, Patient Position: Sitting, Cuff Size: Large)   Pulse 83   Temp 98 F (36.7 C) (Oral)   Ht 5' 7 (1.702 m)   Wt 155 lb 9.6 oz (70.6 kg)   SpO2 99%   BMI 24.37 kg/m    Physical Exam Eyes:     General: Lids are normal. Vision grossly intact. Gaze aligned appropriately. Visual field deficit present. No allergic shiner or scleral icterus.       Left eye: Discharge present.No hordeolum.     Extraocular Movements: Extraocular movements intact.     Conjunctiva/sclera:     Left eye: Left conjunctiva is injected. Chemosis and exudate present. No hemorrhage.    Comments: Does not have pain with finger test but does feel something in his eye when he moves them.  Skin:    Comments: Bilateral well-defined erythematous plaques in bilateral axilla, see photos         No results found for any visits on 03/13/24.    The 10-year ASCVD risk score (Arnett DK, et al., 2019) is: 11%    Assessment & Plan:   Assessment & Plan Redness and discharge of eye Globe intact, pupils  reactive, extraocular muscles intact and without pain.  Denies sick contacts and itching.  Patient has feeling of foreign body.  Mild white discharge in medial canthus.  Most likely differential includes foreign body, bacterial conjunctivitis, viral conjunctivitis.  Unfortunately we do not have a slit-lamp here in the clinic to identify a foreign body or globe scratch.  We were able to get in touch with the on-call ophthalmologist's at [facility] and set up a appointment for the patient at 8 AM tomorrow.  We also called the local urgent care and they said the wait time is 45 minutes and they do have a slit-lamp there but if something is identified he will have to go to the emergency department.  The patient would rather be seen by an ophthalmologist today.  Discussed the best option to see an ophthalmologist today is to go to [facility] emergency department.  Other alternatives to possibly see an ophthalmologist would be to go to the emergency department across the street here at [facility] health.  Patient would rather go to [facility].  Patient left the office today with a plan to go to [facility] emergency department.  Patient recommended to avoid touching his eye.  Rash For the last couple of weeks the patient has had a new rash under both of his armpits.  He denies changes in detergent or soap.  He has been using the same deodorant, arrid extra extra dry.  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  Patient is a smoker and has been under a lot of stress lately.  The lesions were both well-demarcated, smooth and erythematous with no scale.  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  Prescribed hydrocortisone  cream today with instructions to apply twice daily for 2 weeks.  Patient would rather not make a follow-up and just call the office for a follow-up visit if it does not improve.   Recommended patient not put on deodorant at this time and keep the area dry. Orders:   hydrocortisone  cream 1 %; Apply topically 2 (two) times daily. Apply to affected area 2 times daily for two weeeks   No follow-ups on file.    Viktoria King, DO

## 2024-03-13 NOTE — Patient Instructions (Addendum)
 It was wonderful seeing you today!   Please remember to...  1) go to [facility] tomorrow morning at 8 am with your visit summary from [facility]. Can alternate between ibuprofen  and tylenol  for pain relief. Can continue applying warm compresses. Try to keep your eye closed and avoid touching it.  [facility]  [address] [place], [place] [address] Local: [phone]  2) apply hydrocortisone  cream to your armpits twice daily for two weeks  And don't forget to get your flu shot before the end of October!  If you have any questions please feel free to the call the clinic at anytime at [phone].  Have a blessed day,  Dr. Charmayne

## 2024-03-21 NOTE — Progress Notes (Signed)
 Internal Medicine Clinic Attending  I was physically present during the key portions of the resident provided service and participated in the medical decision making of patient's management care. I reviewed pertinent patient test results.  The assessment, diagnosis, and plan were formulated together and I agree with the documentation in the resident's note.  Marvin Elsie NOVAK, MD

## 2024-03-23 ENCOUNTER — Other Ambulatory Visit: Payer: Self-pay | Admitting: Infectious Diseases

## 2024-03-23 DIAGNOSIS — B20 Human immunodeficiency virus [HIV] disease: Secondary | ICD-10-CM

## 2024-03-23 DIAGNOSIS — N529 Male erectile dysfunction, unspecified: Secondary | ICD-10-CM

## 2024-03-25 NOTE — Telephone Encounter (Signed)
 Medication sent to pharmacy

## 2024-07-23 ENCOUNTER — Telehealth: Payer: Self-pay

## 2024-07-23 NOTE — Telephone Encounter (Signed)
 Received voicemail from patient stating he broke a tooth and needs phone number to schedule with [facility]. Called Zyan back and provided him with phone number. Discussed that unsure if they have arrangement with Dr. Loa office.   Lizette Pazos, BSN, RN

## 2024-07-30 ENCOUNTER — Other Ambulatory Visit: Payer: Self-pay

## 2024-07-30 DIAGNOSIS — I158 Other secondary hypertension: Secondary | ICD-10-CM

## 2024-07-30 MED ORDER — VALSARTAN-HYDROCHLOROTHIAZIDE 80-12.5 MG PO TABS: 1.0000 | ORAL_TABLET | Freq: Every day | ORAL | 3 refills | Status: DC

## 2024-07-30 NOTE — Telephone Encounter (Signed)
 Medication sent to pharmacy

## 2024-08-01 ENCOUNTER — Other Ambulatory Visit: Payer: Self-pay

## 2024-08-01 DIAGNOSIS — I158 Other secondary hypertension: Secondary | ICD-10-CM

## 2024-08-01 MED ORDER — VALSARTAN-HYDROCHLOROTHIAZIDE 80-12.5 MG PO TABS: 1.0000 | ORAL_TABLET | Freq: Every day | ORAL | 3 refills | Status: AC

## 2024-08-01 NOTE — Telephone Encounter (Signed)
 Medication sent to pharmacy

## 2024-08-07 ENCOUNTER — Telehealth: Payer: Self-pay | Admitting: *Deleted

## 2024-08-07 NOTE — Telephone Encounter (Signed)
 I called [facility] - they ran the rx thru again. Stated it will be ready for pick up on Friday for $4.00. I called pt - no answer; I left this message on his vm.

## 2024-08-07 NOTE — Telephone Encounter (Signed)
 Copied from CRM [id number]. Topic: Clinical - Prescription Issue >> Aug 07, 2024 11:32 AM Chiquita SQUIBB wrote: Reason for CRM: Patient is calling in stating that he went to pick up his valACYclovir  (VALTREX ) 1000 MG tablet, and the pharmacy told him that the doctor that filled this is not covered by medicaid and the patient would have to pay hundreds of dollars to pick up this prescription. Patient is requesting the office re send this in or contact  the pharmacy. Please update the patient.    [id number] - [place], [place] - [address] AT [facility] OF [facility] & [address] [place] [address] Phone: [phone] Fax: [phone]
